# Patient Record
Sex: Male | Born: 1965
Health system: Southern US, Community
[De-identification: ages and names within clinical notes are randomized; demographics above are authoritative.]

## PROBLEM LIST (undated history)

## (undated) DIAGNOSIS — Z8619 Personal history of other infectious and parasitic diseases: Secondary | ICD-10-CM

## (undated) DIAGNOSIS — K635 Polyp of colon: Secondary | ICD-10-CM

## (undated) DIAGNOSIS — I10 Essential (primary) hypertension: Secondary | ICD-10-CM

## (undated) DIAGNOSIS — L409 Psoriasis, unspecified: Secondary | ICD-10-CM

## (undated) DIAGNOSIS — E785 Hyperlipidemia, unspecified: Secondary | ICD-10-CM

## (undated) DIAGNOSIS — B019 Varicella without complication: Secondary | ICD-10-CM

## (undated) HISTORY — DX: Personal history of other infectious and parasitic diseases: Z86.19

## (undated) HISTORY — DX: Polyp of colon: K63.5

## (undated) HISTORY — DX: Varicella without complication: B01.9

## (undated) HISTORY — DX: Psoriasis, unspecified: L40.9

## (undated) HISTORY — DX: Hyperlipidemia, unspecified: E78.5

## (undated) HISTORY — DX: Essential (primary) hypertension: I10

---

## 1994-04-09 HISTORY — PX: CHOLECYSTECTOMY: SHX55

## 2015-12-13 ENCOUNTER — Encounter: Payer: Self-pay | Admitting: Family Medicine

## 2015-12-13 ENCOUNTER — Ambulatory Visit (INDEPENDENT_AMBULATORY_CARE_PROVIDER_SITE_OTHER): Payer: BLUE CROSS/BLUE SHIELD | Admitting: Family Medicine

## 2015-12-13 VITALS — BP 118/86 | HR 75 | Temp 98.1°F | Resp 16 | Ht 69.0 in | Wt 243.0 lb

## 2015-12-13 DIAGNOSIS — G8929 Other chronic pain: Secondary | ICD-10-CM

## 2015-12-13 DIAGNOSIS — E785 Hyperlipidemia, unspecified: Secondary | ICD-10-CM | POA: Insufficient documentation

## 2015-12-13 DIAGNOSIS — L409 Psoriasis, unspecified: Secondary | ICD-10-CM

## 2015-12-13 DIAGNOSIS — Z23 Encounter for immunization: Secondary | ICD-10-CM | POA: Diagnosis not present

## 2015-12-13 DIAGNOSIS — M47812 Spondylosis without myelopathy or radiculopathy, cervical region: Secondary | ICD-10-CM

## 2015-12-13 DIAGNOSIS — K635 Polyp of colon: Secondary | ICD-10-CM | POA: Diagnosis not present

## 2015-12-13 DIAGNOSIS — E669 Obesity, unspecified: Secondary | ICD-10-CM

## 2015-12-13 DIAGNOSIS — Z114 Encounter for screening for human immunodeficiency virus [HIV]: Secondary | ICD-10-CM

## 2015-12-13 DIAGNOSIS — M722 Plantar fascial fibromatosis: Secondary | ICD-10-CM | POA: Insufficient documentation

## 2015-12-13 DIAGNOSIS — M542 Cervicalgia: Secondary | ICD-10-CM | POA: Diagnosis not present

## 2015-12-13 MED ORDER — MELOXICAM 15 MG PO TABS: 7.5000 mg | ORAL_TABLET | Freq: Every day | ORAL | 2 refills | Status: DC | PRN

## 2015-12-13 MED ORDER — FENOFIBRATE MICRONIZED 67 MG PO CAPS: 67.0000 mg | ORAL_CAPSULE | Freq: Every day | ORAL | 3 refills | Status: DC

## 2015-12-13 MED ORDER — PRAVASTATIN SODIUM 40 MG PO TABS: 40.0000 mg | ORAL_TABLET | Freq: Every day | ORAL | 3 refills | Status: DC

## 2015-12-13 NOTE — Patient Instructions (Signed)
Thank you for coming in to clinic today.  1. Your blood pressure was normal on re-check 118/86, initially it was nearly normal 120/90 2. Ordered future blood work see below 3. Keep up good work with walking and exercise regimen, try to eat a healthy diet, low carb / low sugar  Follow-up as you are planning to with Pain Management, please make sure they send us the records as well  Refills meds sent today  For Mobic, as discussed, I am concerned about long-term use affecting your kidneys and liver, recommend reducing dose to half tab 7.5mg  daily for up to 1 month see if this works. Otherwise would ideally take up to 2 weeks off and then 1 month back on the medicine.  If your plantar fasciitis is not improving, let me know and we can refer you to a Sports Medicine Specialist  For Colonoscopy, due in 2018, we will refer you to GI specialist to proceed with the screening  - Please schedule a "Lab Only" visit (early morning, 8:00am to 9:00am) to get your blood work drawn here at our clinic. - You need to be fasting (No food or drink after midnight, and nothing in the morning before your blood draw). - I have already ordered your blood work, so you may schedule this appointment at your convenience - We can discuss results at next appointment, otherwise our office will contact you with results by phone or with a letter   Please schedule a follow-up appointment with Dr. Althea CharonKaramalegos in 1 year for Annual Physical / Labs - please send us a mychart note or call clinic to notify us to schedule / orders labs before your visit.  If you have any other questions or concerns, please feel free to call the clinic or send a message through MyChart. You may also schedule an earlier appointment if necessary.  Saralyn PilarAlexander Karamalegos, DO [facility]

## 2015-12-13 NOTE — Progress Notes (Signed)
Subjective:    Patient ID: Isaiah Johnson, male    DOB: 10/10/1965, 50 y.o.   MRN: [id number]  Isaiah Johnson is a 50 y.o. male presenting on 12/13/2015 for Establish Care   HPI  HYPERLIPIDEMIA: - Reports no concerns. Last lipid panel >1 yr ago, reportedly controlled. Not fasting today. - Currently taking Pravastatin 40mg  daily, Fenofibrate 67mg , tolerating well, needs refills - Also taking OTC Niacin 500mg  daily and Fish Oil - Regular exercise with fast walking 2-3x weekly for long walk, nightly 20 min walk with wife - H/o obesity, fam history heart disease and DM - Denies any myalgias, muscle aches  CHRONIC NECK PAIN / Left Foot Plantar Fasciitis - Reports no known inciting injury, except possible whiplash injury back in 90s. History of pinched nerve with numbness / tingling radiating down Right arm, previously treated by pain management doctors in [place], had epidural injections with improvement, last in 2016, usually lasts up to 9 months. Also past treated with oxycodone, but did not tolerate well due to constipation, has been off opiates for years - Currently doing well without active neck pain or flare of symptoms. - Taking Mobic 15mg  daily, for up to 8 years now, for plantar fasciitis left foot, seems to continue to help, previously had injections. - Denies any trauma, fall or injury, numbness, tingling, paresthesias, extremity swelling, redness  Psoriasis, without arthritis - Chronic problem 10 yr, reportedly limited to skin with overall significant improvement on treatment. He denies any involvement of joints or arthritis. - Followed by [facility], currently treated with Fluocinonide 0.05% BID most days, Triamcinolone 0.1% PRN sensitive areas  Additional Social History: - Moved from [place] 1 year ago, has not re-established PCP in [place].  - Works as Production designer, theatre/television/film in [employer], Psychiatric nurse Maintenance: - Last colonoscopy 2013 with several polyps, advised to  return 5 years - 2018, also fam history uncle passed from colon cancer in 67s - No known family history of prostate CA. Denies any current prostate symptoms. - Due for Tdap, will get today - Due for Flu Shot, will return once clinic ready to give flu shot    Past Medical History:  Diagnosis Date  . Chicken pox   . Colon polyps   . H/O rubella   . History of measles   . Hyperlipidemia   . Psoriasis    Social History   Social History  . Marital status: Married    Spouse name: Chiropodist  . Number of children: N/A  . Years of education: High school   Occupational History  . Environmental consultant    Social History Main Topics  . Smoking status: Never Smoker  . Smokeless tobacco: Never Used  . Alcohol use Yes     Comment: 1 drink  . Drug use: No  . Sexual activity: Not on file   Other Topics Concern  . Not on file   Social History Narrative  . No narrative on file   Family History  Problem Relation Age of Onset  . Diabetes Mother   . Heart disease Father   . Depression Father    No current outpatient prescriptions on file prior to visit.   No current facility-administered medications on file prior to visit.     Review of Systems Per HPI unless specifically indicated above     Objective:    BP 118/86 (BP Location: Left Arm, Cuff Size: Normal)   Pulse 75   Temp 98.1 F (36.7 C) (  Oral)   Resp 16   Ht 5\' 9"  (1.753 m)   Wt 243 lb (110.2 kg)   BMI 35.88 kg/m   Wt Readings from Last 3 Encounters:  12/13/15 243 lb (110.2 kg)    Physical Exam  Constitutional: He is oriented to person, place, and time. He appears well-developed and well-nourished. No distress.  Well-appearing, comfortable, cooperative, obese  HENT:  Head: Normocephalic and atraumatic.  Mouth/Throat: Oropharynx is clear and moist.  Eyes: Conjunctivae and EOM are normal. Pupils are equal, round, and reactive to light.  Neck: Neck supple. No thyromegaly present.  Neck: Inspection:  Normal appearance without deformity or asymmetry Palpation: Supple, non-tender, no hypertonicity ROM: Full active flexion / extension, very mild limited Left rotation, normal Right rotation Special Testing: Spurling's maneuver negative for radiculopathy bilateral, mild discomfort on Left Neurovascular: distal sensation intact upper ext  Cardiovascular: Normal rate, regular rhythm, normal heart sounds and intact distal pulses.   No murmur heard. Pulmonary/Chest: Effort normal and breath sounds normal. No respiratory distress. He has no wheezes. He has no rales.  Abdominal: Soft. Bowel sounds are normal. He exhibits no distension and no mass. There is no tenderness.  Musculoskeletal: Normal range of motion. He exhibits no edema or tenderness.  Lymphadenopathy:    He has no cervical adenopathy.  Neurological: He is alert and oriented to person, place, and time.  Skin: Skin is warm and dry. No rash noted. He is not diaphoretic.  Psychiatric: He has a normal mood and affect. His behavior is normal.  Nursing note and vitals reviewed.  No results found for this or any previous visit.    Assessment & Plan:   Problem List Items Addressed This Visit    Psoriasis    Stable well controlled, followed by Dermatology Continues on Fluocinonide 0.05% cream, Triamcinolone 0.1% cream      Plantar fasciitis of left foot    Chronic problem with Left foot >8 yrs, improved on Meloxicam, has not taken break from this med, trial on injections and other therapy.  Plan: 1. Concern about chronic mobic use - refilled today and advised to reduce dose to 7.5 half tab daily for 1 month, future can stop for up to 2-4 weeks then resume for 1 month at a time if needed 2. Advised on some ice bottle stretches 3. Future follow-up may need referral to Sports Med      Relevant Medications   meloxicam (MOBIC) 15 MG tablet   Obesity    Chronic problem with BMI >35, HLD, and risk factors with family history of heart  disease and diabetes.  Plan: 1. Encouraged to continue improved lifestyle modifications with regular aerobic exercise, dietary modifications 2. Check CMET - if elevated glucose will proceed with A1c testing for DM screening, alternatively next visit will offer A1c 3. Check fasting Lipid panel      Relevant Orders   COMPLETE METABOLIC PANEL WITH GFR   Lipid panel   Hyperlipidemia - Primary    Chronic problem with dyslipidemia, without available prior lipid panel results, by report controlled on current regimen.  Plan: 1. Check fasting lipid panel - follow-up 2. Refilled current Pravastatin 40mg  daily (mod intensity), Fenofibrate 67mg  daily, Niacin 500mg  nightly, with ASA 81      Relevant Medications   aspirin EC 81 MG tablet   fenofibrate micronized (LOFIBRA) 67 MG capsule   pravastatin (PRAVACHOL) 40 MG tablet   Other Relevant Orders   COMPLETE METABOLIC PANEL WITH GFR   Lipid panel  DJD (degenerative joint disease) of cervical spine   Relevant Medications   aspirin EC 81 MG tablet   meloxicam (MOBIC) 15 MG tablet   Colon polyps   Chronic neck pain    Currently improved and stable, likely secondary to C-spine DJD with prior nerve injury. Previously followed by pain management / anesthesia treated with cervical epidural injections with improvement up to 9 months.  Plan: 1. Patient already currently establishing with local pain management [place] Dr Pernell DupreAdams, will follow, request records, if needs referral will place, likely need to continue epidural and other treatments. Discussion today no plans to rx long-term opiates, patient not interested regardless. 2. Refill Mobic      Relevant Medications   aspirin EC 81 MG tablet   meloxicam (MOBIC) 15 MG tablet    Other Visit Diagnoses    Screening for HIV (human immunodeficiency virus)       Relevant Orders   HIV antibody   Need for Tdap vaccination       Relevant Orders   Tdap vaccine greater than or equal to 7yo IM  (Completed)      Meds ordered this encounter  Medications  .       .       .       .       .       .       .       .       .       .       . fenofibrate micronized (LOFIBRA) 67 MG capsule    Sig: Take 1 capsule (67 mg total) by mouth daily before breakfast.    Dispense:  90 capsule    Refill:  3  . meloxicam (MOBIC) 15 MG tablet    Sig: Take 0.5-1 tablets (7.5-15 mg total) by mouth daily as needed for pain.    Dispense:  30 tablet    Refill:  2  . pravastatin (PRAVACHOL) 40 MG tablet    Sig: Take 1 tablet (40 mg total) by mouth daily.    Dispense:  90 tablet    Refill:  3      Follow up plan: Return in about 1 year (around 12/12/2016) for cholesterol, physical, chronic pain.  Saralyn PilarAlexander Kanisha Duba, DO [facility] 12/13/2015, 4:15 PM

## 2015-12-13 NOTE — Assessment & Plan Note (Signed)
Currently improved and stable, likely secondary to C-spine DJD with prior nerve injury. Previously followed by pain management / anesthesia treated with cervical epidural injections with improvement up to 9 months.  Plan: 1. Patient already currently establishing with local pain management [place] Dr Pernell DupreAdams, will follow, request records, if needs referral will place, likely need to continue epidural and other treatments. Discussion today no plans to rx long-term opiates, patient not interested regardless. 2. Refill Mobic

## 2015-12-13 NOTE — Assessment & Plan Note (Addendum)
Chronic problem with BMI >35, HLD, and risk factors with family history of heart disease and diabetes.  Plan: 1. Encouraged to continue improved lifestyle modifications with regular aerobic exercise, dietary modifications 2. Check CMET - if elevated glucose will proceed with A1c testing for DM screening, alternatively next visit will offer A1c 3. Check fasting Lipid panel

## 2015-12-13 NOTE — Assessment & Plan Note (Signed)
Chronic problem with dyslipidemia, without available prior lipid panel results, by report controlled on current regimen.  Plan: 1. Check fasting lipid panel - follow-up 2. Refilled current Pravastatin 40mg  daily (mod intensity), Fenofibrate 67mg  daily, Niacin 500mg  nightly, with ASA 81

## 2015-12-13 NOTE — Assessment & Plan Note (Signed)
Stable well controlled, followed by Dermatology Continues on Fluocinonide 0.05% cream, Triamcinolone 0.1% cream

## 2015-12-13 NOTE — Assessment & Plan Note (Signed)
Chronic problem with Left foot >8 yrs, improved on Meloxicam, has not taken break from this med, trial on injections and other therapy.  Plan: 1. Concern about chronic mobic use - refilled today and advised to reduce dose to 7.5 half tab daily for 1 month, future can stop for up to 2-4 weeks then resume for 1 month at a time if needed 2. Advised on some ice bottle stretches 3. Future follow-up may need referral to Sports Med

## 2016-01-05 ENCOUNTER — Other Ambulatory Visit: Payer: Self-pay

## 2016-01-05 DIAGNOSIS — Z114 Encounter for screening for human immunodeficiency virus [HIV]: Secondary | ICD-10-CM

## 2016-01-05 DIAGNOSIS — E669 Obesity, unspecified: Secondary | ICD-10-CM

## 2016-01-05 DIAGNOSIS — E785 Hyperlipidemia, unspecified: Secondary | ICD-10-CM | POA: Diagnosis not present

## 2016-01-06 ENCOUNTER — Other Ambulatory Visit (INDEPENDENT_AMBULATORY_CARE_PROVIDER_SITE_OTHER): Payer: BLUE CROSS/BLUE SHIELD

## 2016-01-06 DIAGNOSIS — Z23 Encounter for immunization: Secondary | ICD-10-CM

## 2016-01-06 LAB — LIPID PANEL
CHOLESTEROL: 139 mg/dL (ref 125–200)
HDL: 33 mg/dL — ABNORMAL LOW (ref >=40)
LDL Cholesterol: 88 mg/dL (ref <130)
TRIGLYCERIDES: 91 mg/dL (ref <150)
Total CHOL/HDL Ratio: 4.2 Ratio (ref <=5.0)
VLDL: 18 mg/dL (ref <30)

## 2016-01-06 LAB — COMPLETE METABOLIC PANEL WITH GFR
ALBUMIN: 4.3 g/dL (ref 3.6–5.1)
ALT: 24 U/L (ref 9–46)
AST: 20 U/L (ref 10–40)
Alkaline Phosphatase: 58 U/L (ref 40–115)
BILIRUBIN TOTAL: 0.4 mg/dL (ref 0.2–1.2)
BUN: 19 mg/dL (ref 7–25)
CALCIUM: 9.1 mg/dL (ref 8.6–10.3)
CO2: 22 mmol/L (ref 20–31)
CREATININE: 0.98 mg/dL (ref 0.60–1.35)
Chloride: 105 mmol/L (ref 98–110)
GFR, Est African American: >89 mL/min (ref >=60)
Glucose, Bld: 90 mg/dL (ref 65–99)
Potassium: 4.6 mmol/L (ref 3.5–5.3)
Sodium: 138 mmol/L (ref 135–146)
Total Protein: 6.5 g/dL (ref 6.1–8.1)

## 2016-01-07 LAB — HIV ANTIBODY (ROUTINE TESTING W REFLEX): HIV 1&2 Ab, 4th Generation: NONREACTIVE

## 2016-01-11 DIAGNOSIS — D225 Melanocytic nevi of trunk: Secondary | ICD-10-CM | POA: Diagnosis not present

## 2016-01-25 ENCOUNTER — Ambulatory Visit: Payer: BLUE CROSS/BLUE SHIELD | Attending: Anesthesiology | Admitting: Anesthesiology

## 2016-01-25 ENCOUNTER — Other Ambulatory Visit: Payer: Self-pay | Admitting: Anesthesiology

## 2016-01-25 ENCOUNTER — Encounter: Payer: Self-pay | Admitting: Anesthesiology

## 2016-01-25 VITALS — BP 127/83 | HR 73 | Temp 98.3°F | Resp 16 | Ht 69.0 in | Wt 240.0 lb

## 2016-01-25 DIAGNOSIS — Z9049 Acquired absence of other specified parts of digestive tract: Secondary | ICD-10-CM | POA: Insufficient documentation

## 2016-01-25 DIAGNOSIS — M79601 Pain in right arm: Secondary | ICD-10-CM | POA: Insufficient documentation

## 2016-01-25 DIAGNOSIS — Z8249 Family history of ischemic heart disease and other diseases of the circulatory system: Secondary | ICD-10-CM | POA: Insufficient documentation

## 2016-01-25 DIAGNOSIS — M5412 Radiculopathy, cervical region: Secondary | ICD-10-CM

## 2016-01-25 DIAGNOSIS — M25511 Pain in right shoulder: Secondary | ICD-10-CM | POA: Diagnosis not present

## 2016-01-25 DIAGNOSIS — M5030 Other cervical disc degeneration, unspecified cervical region: Secondary | ICD-10-CM

## 2016-01-25 DIAGNOSIS — Z86010 Personal history of colonic polyps: Secondary | ICD-10-CM | POA: Diagnosis not present

## 2016-01-25 DIAGNOSIS — G8929 Other chronic pain: Secondary | ICD-10-CM | POA: Diagnosis not present

## 2016-01-25 DIAGNOSIS — M1288 Other specific arthropathies, not elsewhere classified, other specified site: Secondary | ICD-10-CM | POA: Diagnosis not present

## 2016-01-25 DIAGNOSIS — Z8619 Personal history of other infectious and parasitic diseases: Secondary | ICD-10-CM | POA: Diagnosis not present

## 2016-01-25 DIAGNOSIS — M50122 Cervical disc disorder at C5-C6 level with radiculopathy: Secondary | ICD-10-CM | POA: Insufficient documentation

## 2016-01-25 DIAGNOSIS — Z833 Family history of diabetes mellitus: Secondary | ICD-10-CM | POA: Insufficient documentation

## 2016-01-25 DIAGNOSIS — G9520 Unspecified cord compression: Secondary | ICD-10-CM | POA: Diagnosis not present

## 2016-01-25 DIAGNOSIS — E785 Hyperlipidemia, unspecified: Secondary | ICD-10-CM | POA: Insufficient documentation

## 2016-01-25 DIAGNOSIS — Z7982 Long term (current) use of aspirin: Secondary | ICD-10-CM | POA: Diagnosis not present

## 2016-01-25 DIAGNOSIS — M542 Cervicalgia: Secondary | ICD-10-CM

## 2016-01-25 DIAGNOSIS — M50123 Cervical disc disorder at C6-C7 level with radiculopathy: Secondary | ICD-10-CM | POA: Insufficient documentation

## 2016-01-25 DIAGNOSIS — L409 Psoriasis, unspecified: Secondary | ICD-10-CM | POA: Diagnosis not present

## 2016-01-25 DIAGNOSIS — M47812 Spondylosis without myelopathy or radiculopathy, cervical region: Secondary | ICD-10-CM

## 2016-01-25 MED ORDER — DEXAMETHASONE SODIUM PHOSPHATE 10 MG/ML IJ SOLN: 10.0000 mg | Freq: Once | INTRAMUSCULAR | Status: DC

## 2016-01-25 MED ORDER — ROPIVACAINE HCL 2 MG/ML IJ SOLN
INTRAMUSCULAR | Status: AC
  Filled 2016-01-25: qty 10

## 2016-01-25 MED ORDER — ROPIVACAINE HCL 2 MG/ML IJ SOLN: 10.0000 mL | Freq: Once | INTRAMUSCULAR | Status: DC

## 2016-01-25 MED ORDER — DEXAMETHASONE SODIUM PHOSPHATE 10 MG/ML IJ SOLN
INTRAMUSCULAR | Status: AC
  Filled 2016-01-25: qty 1

## 2016-01-25 NOTE — Patient Instructions (Signed)
Trigger Point Injection Trigger points are areas where you have muscle pain. A trigger point injection is a shot given in the trigger point to relieve that pain. A trigger point might feel like a knot in your muscle. It hurts to press on a trigger point. Sometimes the pain spreads out (radiates) to other parts of the body. For example, pressing on a trigger point in your shoulder might cause pain in your arm or neck. You might have one trigger point. Or, you might have more than one. People often have trigger points in their upper back and lower back. They also occur often in the neck and shoulders. Pain from a trigger point lasts for a long time. It can make it hard to keep moving. You might not be able to do the exercise or physical therapy that could help you deal with the pain. A trigger point injection may help. It does not work for everyone. But, it may relieve your pain for a few days or a few months. A trigger point injection does not cure long-lasting (chronic) pain. LET YOUR CAREGIVER KNOW ABOUT:  Any allergies (especially to latex, lidocaine, or steroids).  Blood-thinning medicines that you take. These drugs can lead to bleeding or bruising after an injection. They include:  Aspirin.  Ibuprofen.  Clopidogrel.  Warfarin.  Other medicines you take. This includes all vitamins, herbs, eyedrops, over-the-counter medicines, and creams.  Use of steroids.  Recent infections.  Past problems with numbing medicines.  Bleeding problems.  Surgeries you have had.  Other health problems. RISKS AND COMPLICATIONS A trigger point injection is a safe treatment. However, problems may develop, such as:  Minor side effects usually go away in 1 to 2 days. These may include:  Soreness.  Bruising.  Stiffness.  More serious problems are rare. But, they may include:  Bleeding under the skin (hematoma).  Skin infection.  Breaking off of the needle under your skin.  Lung  puncture.  The trigger point injection may not work for you. BEFORE THE PROCEDURE You may need to stop taking any medicine that thins your blood. This is to prevent bleeding and bruising. Usually these medicines are stopped several days before the injection. No other preparation is needed. PROCEDURE  A trigger point injection can be given in your caregiver's office or in a clinic. Each injection takes 2 minutes or less.  Your caregiver will feel for trigger points. The caregiver may use a marker to circle the area for the injection.  The skin over the trigger point will be washed with a germ-killing (antiseptic) solution.  The caregiver pinches the spot for the injection.  Then, a very thin needle is used for the shot. You may feel pain or a twitching feeling when the needle enters the trigger point.  A numbing solution may be injected into the trigger point. Sometimes a drug to keep down swelling, redness, and warmth (inflammation) is also injected.  Your caregiver moves the needle around the trigger zone until the tightness and twitching goes away.  After the injection, your caregiver may put gentle pressure over the injection site.  Then it is covered with a bandage. AFTER THE PROCEDURE  You can go right home after the injection.  The bandage can be taken off after a few hours.  You may feel sore and stiff for 1 to 2 days.  Go back to your regular activities slowly. Your caregiver may ask you to stretch your muscles. Do not do anything that takes   extra energy for a few days.  Follow your caregiver's instructions to manage and treat other pain.   This information is not intended to replace advice given to you by your health care provider. Make sure you discuss any questions you have with your health care provider.   Document Released: 03/15/2011 Document Revised: 07/21/2012 Document Reviewed: 03/15/2011 [employer] Interactive Patient Education 2016 [employer] Epidural  Steroid Injection Patient Information  Description: The epidural space surrounds the nerves as they exit the spinal cord.  In some patients, the nerves can be compressed and inflamed by a bulging disc or a tight spinal canal (spinal stenosis).  By injecting steroids into the epidural space, we can bring irritated nerves into direct contact with a potentially helpful medication.  These steroids act directly on the irritated nerves and can reduce swelling and inflammation which often leads to decreased pain.  Epidural steroids may be injected anywhere along the spine and from the neck to the low back depending upon the location of your pain.   After numbing the skin with local anesthetic (like Novocaine), a small needle is passed into the epidural space slowly.  You may experience a sensation of pressure while this is being done.  The entire block usually last less than 10 minutes.  Conditions which may be treated by epidural steroids:   Low back and leg pain  Neck and arm pain  Spinal stenosis  Post-laminectomy syndrome  Herpes zoster (shingles) pain  Pain from compression fractures  Preparation for the injection:  1. Do not eat any solid food or dairy products within 8 hours of your appointment.  2. You may drink clear liquids up to 3 hours before appointment.  Clear liquids include water, black coffee, juice or soda.  No milk or cream please. 3. You may take your regular medication, including pain medications, with a sip of water before your appointment  Diabetics should hold regular insulin (if taken separately) and take 1/2 normal NPH dos the morning of the procedure.  Carry some sugar containing items with you to your appointment. 4. A driver must accompany you and be prepared to drive you home after your procedure.  5. Bring all your current medications with your. 6. An IV may be inserted and sedation may be given at the discretion of the physician.   7. A blood pressure cuff, EKG  and other monitors will often be applied during the procedure.  Some patients may need to have extra oxygen administered for a short period. 8. You will be asked to provide medical information, including your allergies, prior to the procedure.  We must know immediately if you are taking blood thinners (like Coumadin/Warfarin)  Or if you are allergic to IV iodine contrast (dye). We must know if you could possible be pregnant.  Possible side-effects:  Bleeding from needle site  Infection (rare, may require surgery)  Nerve injury (rare)  Numbness & tingling (temporary)  Difficulty urinating (rare, temporary)  Spinal headache ( a headache worse with upright posture)  Light -headedness (temporary)  Pain at injection site (several days)  Decreased blood pressure (temporary)  Weakness in arm/leg (temporary)  Pressure sensation in back/neck (temporary)  Call if you experience:  Fever/chills associated with headache or increased back/neck pain.  Headache worsened by an upright position.  New onset weakness or numbness of an extremity below the injection site  Hives or difficulty breathing (go to the emergency room)  Inflammation or drainage at the infection site  Severe back/neck  pain  Any new symptoms which are concerning to you  Please note:  Although the local anesthetic injected can often make your back or neck feel good for several hours after the injection, the pain will likely return.  It takes 3-7 days for steroids to work in the epidural space.  You may not notice any pain relief for at least that one week.  If effective, we will often do a series of three injections spaced 3-6 weeks apart to maximally decrease your pain.  After the initial series, we generally will wait several months before considering a repeat injection of the same type.  If you have any questions, please call [phone] [facility] R[facility] AND  COMPLICATIONS  What are the risk, side effects and possible complications? Generally speaking, most procedures are safe.  However, with any procedure there are risks, side effects, and the possibility of complications.  The risks and complications are dependent upon the sites that are lesioned, or the type of nerve block to be performed.  The closer the procedure is to the spine, the more serious the risks are.  Great care is taken when placing the radio frequency needles, block needles or lesioning probes, but sometimes complications can occur. 1. Infection: Any time there is an injection through the skin, there is a risk of infection.  This is why sterile conditions are used for these blocks.  There are four possible types of infection. 1. Localized skin infection. 2. Central Nervous System Infection-This can be in the form of Meningitis, which can be deadly. 3. Epidural Infections-This can be in the form of an epidural abscess, which can cause pressure inside of the spine, causing compression of the spinal cord with subsequent paralysis. This would require an emergency surgery to decompress, and there are no guarantees that the patient would recover from the paralysis. 4. Discitis-This is an infection of the intervertebral discs.  It occurs in about 1% of discography procedures.  It is difficult to treat and it may lead to surgery.        2. Pain: the needles have to go through skin and soft tissues, will cause soreness.       3. Damage to internal structures:  The nerves to be lesioned may be near blood vessels or    other nerves which can be potentially damaged.       4. Bleeding: Bleeding is more common if the patient is taking blood thinners such as  aspirin, Coumadin, Ticiid, Plavix, etc., or if he/she have some genetic predisposition  such as hemophilia. Bleeding into the spinal canal can cause compression of the spinal  cord with subsequent paralysis.  This would require an emergency surgery  to  decompress and there are no guarantees that the patient would recover from the  paralysis.       5. Pneumothorax:  Puncturing of a lung is a possibility, every time a needle is introduced in  the area of the chest or upper back.  Pneumothorax refers to free air around the  collapsed lung(s), inside of the thoracic cavity (chest cavity).  Another two possible  complications related to a similar event would include: Hemothorax and Chylothorax.   These are variations of the Pneumothorax, where instead of air around the collapsed  lung(s), you may have blood or chyle, respectively.       6. Spinal headaches: They may occur with any procedures in the area of the spine.  7. Persistent CSF (Cerebro-Spinal Fluid) leakage: This is a rare problem, but may occur  with prolonged intrathecal or epidural catheters either due to the formation of a fistulous  track or a dural tear.       8. Nerve damage: By working so close to the spinal cord, there is always a possibility of  nerve damage, which could be as serious as a permanent spinal cord injury with  paralysis.       9. Death:  Although rare, severe deadly allergic reactions known as "Anaphylactic  reaction" can occur to any of the medications used.      10. Worsening of the symptoms:  We can always make thing worse.  What are the chances of something like this happening? Chances of any of this occuring are extremely low.  By statistics, you have more of a chance of getting killed in a motor vehicle accident: while driving to the hospital than any of the above occurring .  Nevertheless, you should be aware that they are possibilities.  In general, it is similar to taking a shower.  Everybody knows that you can slip, hit your head and get killed.  Does that mean that you should not shower again?  Nevertheless always keep in mind that statistics do not mean anything if you happen to be on the wrong side of them.  Even if a procedure has a 1 (one) in a 1,000,000  (million) chance of going wrong, it you happen to be that one..Also, keep in mind that by statistics, you have more of a chance of having something go wrong when taking medications.  Who should not have this procedure? If you are on a blood thinning medication (e.g. Coumadin, Plavix, see list of "Blood Thinners"), or if you have an active infection going on, you should not have the procedure.  If you are taking any blood thinners, please inform your physician.  How should I prepare for this procedure?  Do not eat or drink anything at least six hours prior to the procedure.  Bring a driver with you .  It cannot be a taxi.  Come accompanied by an adult that can drive you back, and that is strong enough to help you if your legs get weak or numb from the local anesthetic.  Take all of your medicines the morning of the procedure with just enough water to swallow them.  If you have diabetes, make sure that you are scheduled to have your procedure done first thing in the morning, whenever possible.  If you have diabetes, take only half of your insulin dose and notify our nurse that you have done so as soon as you arrive at the clinic.  If you are diabetic, but only take blood sugar pills (oral hypoglycemic), then do not take them on the morning of your procedure.  You may take them after you have had the procedure.  Do not take aspirin or any aspirin-containing medications, at least eleven (11) days prior to the procedure.  They may prolong bleeding.  Wear loose fitting clothing that may be easy to take off and that you would not mind if it got stained with Betadine or blood.  Do not wear any jewelry or perfume  Remove any nail coloring.  It will interfere with some of our monitoring equipment.  NOTE: Remember that this is not meant to be interpreted as a complete list of all possible complications.  Unforeseen problems may occur.  BLOOD THINNERS The  following drugs contain aspirin or other  products, which can cause increased bleeding during surgery and should not be taken for 2 weeks prior to and 1 week after surgery.  If you should need take something for relief of minor pain, you may take acetaminophen which is found in Tylenol,m Datril, Anacin-3 and Panadol. It is not blood thinner. The products listed below are.  Do not take any of the products listed below in addition to any listed on your instruction sheet.  A.P.C or A.P.C with Codeine Codeine Phosphate Capsules #3 Ibuprofen Ridaura  ABC compound Congesprin Imuran rimadil  Advil Cope Indocin Robaxisal  Alka-Seltzer Effervescent Pain Reliever and Antacid Coricidin or Coricidin-D  Indomethacin Rufen  Alka-Seltzer plus Cold Medicine Cosprin Ketoprofen S-A-C Tablets  Anacin Analgesic Tablets or Capsules Coumadin Korlgesic Salflex  Anacin Extra Strength Analgesic tablets or capsules CP-2 Tablets Lanoril Salicylate  Anaprox Cuprimine Capsules Levenox Salocol  Anexsia-D Dalteparin Magan Salsalate  Anodynos Darvon compound Magnesium Salicylate Sine-off  Ansaid Dasin Capsules Magsal Sodium Salicylate  Anturane Depen Capsules Marnal Soma  APF Arthritis pain formula Dewitt's Pills Measurin Stanback  Argesic Dia-Gesic Meclofenamic Sulfinpyrazone  Arthritis Bayer Timed Release Aspirin Diclofenac Meclomen Sulindac  Arthritis pain formula Anacin Dicumarol Medipren Supac  Analgesic (Safety coated) Arthralgen Diffunasal Mefanamic Suprofen  Arthritis Strength Bufferin Dihydrocodeine Mepro Compound Suprol  Arthropan liquid Dopirydamole Methcarbomol with Aspirin Synalgos  ASA tablets/Enseals Disalcid Micrainin Tagament  Ascriptin Doan's Midol Talwin  Ascriptin A/D Dolene Mobidin Tanderil  Ascriptin Extra Strength Dolobid Moblgesic Ticlid  Ascriptin with Codeine Doloprin or Doloprin with Codeine Momentum Tolectin  Asperbuf Duoprin Mono-gesic Trendar  Aspergum Duradyne Motrin or Motrin IB Triminicin  Aspirin plain, buffered or enteric  coated Durasal Myochrisine Trigesic  Aspirin Suppositories Easprin Nalfon Trillsate  Aspirin with Codeine Ecotrin Regular or Extra Strength Naprosyn Uracel  Atromid-S Efficin Naproxen Ursinus  Auranofin Capsules Elmiron Neocylate Vanquish  Axotal Emagrin Norgesic Verin  Azathioprine Empirin or Empirin with Codeine Normiflo Vitamin E  Azolid Emprazil Nuprin Voltaren  Bayer Aspirin plain, buffered or children's or timed BC Tablets or powders Encaprin Orgaran Warfarin Sodium  Buff-a-Comp Enoxaparin Orudis Zorpin  Buff-a-Comp with Codeine Equegesic Os-Cal-Gesic   Buffaprin Excedrin plain, buffered or Extra Strength Oxalid   Bufferin Arthritis Strength Feldene Oxphenbutazone   Bufferin plain or Extra Strength Feldene Capsules Oxycodone with Aspirin   Bufferin with Codeine Fenoprofen Fenoprofen Pabalate or Pabalate-SF   Buffets II Flogesic Panagesic   Buffinol plain or Extra Strength Florinal or Florinal with Codeine Panwarfarin   Buf-Tabs Flurbiprofen Penicillamine   Butalbital Compound Four-way cold tablets Penicillin   Butazolidin Fragmin Pepto-Bismol   Carbenicillin Geminisyn Percodan   Carna Arthritis Reliever Geopen Persantine   Carprofen Gold's salt Persistin   Chloramphenicol Goody's Phenylbutazone   Chloromycetin Haltrain Piroxlcam   Clmetidine heparin Plaquenil   Cllnoril Hyco-pap Ponstel   Clofibrate Hydroxy chloroquine Propoxyphen         Before stopping any of these medications, be sure to consult the physician who ordered them.  Some, such as Coumadin (Warfarin) are ordered to prevent or treat serious conditions such as "deep thrombosis", "pumonary embolisms", and other heart problems.  The amount of time that you may need off of the medication may also vary with the medication and the reason for which you were taking it.  If you are taking any of these medications, please make sure you notify your pain physician before you undergo any procedures.

## 2016-01-25 NOTE — Progress Notes (Signed)
Subjective:  Patient ID: Isaiah Johnson, male    DOB: 11/21/1965  Age: 50 y.o. MRN: [id number]  CC: Neck Pain      PROCEDURE:Right trapezius trigger point injection 2  HPI Isaiah Johnson presents for a new patient evaluation. Isaiah Johnson has a long-standing history of neck pain and right shoulder and arm pain. This began 4 years ago. He has no known inciting event but has been seen by pain clinic previously where he has had cervical epidural steroid injections and cervical facet injections with significant relief in his pain. Presently he is describing a pain that starts in the neck region and radiates into the right posterior shoulder down the lateral aspect of the arm and affects the ring and little finger on his right hand with associated numbness and tingling. Sometimes he'll experience numbness and tingling affecting the entire hand as well. He is failed conservative therapy whereas exercises stretching and pain medication they'll to alleviate his pain. He describes this as a maximum VAS of 10 best a 3 worse with activity and not influenced by time of day. The pain is worse with lifting and improves with medication management nerve block techniques as mentioned cold and warm showers and massage. The pain wakes him up at night and is described as throbbing shooting horrible nagging pressure-like and pulsating. He's had previous MRI and neurologic evaluation as well as neurosurgical evaluation PNCV's and nerve blocks.  His MRI is unavailable to me but as per the dictation dated 8 2011 for his referring clinic it shows a cervical spine film 2010 with a left paracentral disc herniation at C6-C7 with some impingement and compression of the spinal cord and narrowing the left neural foramen at C4-C6 there is some reversal of normal cervical lordosis and at C5-6 central disc protrusion with broad-based disc and osteophyte complex with narrowing the left neural foramen bilaterally right greater than left at  C5-6. There is broad-based disc and osteophyte complex at C3-4 with mild narrowing the left neural foramen and at C4-5 there is a moderate prominent broad-based disc bulge with narrowing of the right neural foramen  History Isaiah Johnson has a past medical history of Chicken pox; Colon polyps; H/O rubella; History of measles; Hyperlipidemia; and Psoriasis.   He has a past surgical history that includes Cholecystectomy (1996).   His family history includes Depression in his father; Diabetes in his mother; Heart disease in his father and mother.He reports that he has never smoked. He has never used smokeless tobacco. He reports that he drinks alcohol. He reports that he does not use drugs.  No results found for this or any previous visit.  No results found for: TOXASSSELUR  Outpatient Medications Prior to Visit  Medication Sig Dispense Refill  . aspirin EC 81 MG tablet Take 81 mg by mouth daily.    . fenofibrate micronized (LOFIBRA) 67 MG capsule Take 1 capsule (67 mg total) by mouth daily before breakfast. 90 capsule 3  . meloxicam (MOBIC) 15 MG tablet Take 0.5-1 tablets (7.5-15 mg total) by mouth daily as needed for pain. 30 tablet 2  . Multiple Vitamin (MULTIVITAMIN WITH MINERALS) TABS tablet Take 1 tablet by mouth daily.    . niacin 500 MG tablet Take 500 mg by mouth at bedtime.    . Omega-3 Fatty Acids (FISH OIL) 1000 MG CAPS Take 1,000 mg by mouth daily.    . pravastatin (PRAVACHOL) 40 MG tablet Take 1 tablet (40 mg total) by mouth daily. 90 tablet 3  .  triamcinolone cream (KENALOG) 0.1 % Apply 1 application topically daily as needed.    . fluocinonide cream (LIDEX) 0.05 % Apply 1 application topically 2 (two) times daily as needed.    . vitamin E (VITAMIN E) 1000 UNIT capsule Take 1,000 Units by mouth daily.     No facility-administered medications prior to visit.    Lab Results  Component Value Date   GLUCOSE 90 01/05/2016   CHOL 139 01/05/2016   TRIG 91 01/05/2016   HDL 33 (L)  01/05/2016   LDLCALC 88 01/05/2016   ALT 24 01/05/2016   AST 20 01/05/2016   NA 138 01/05/2016   K 4.6 01/05/2016   CL 105 01/05/2016   CREATININE 0.98 01/05/2016   BUN 19 01/05/2016   CO2 22 01/05/2016    --------------------------------------------------------------------------------------------------------------------- Patient was never admitted.     ---------------------------------------------------------------------------------------------------------------------- Past Medical History:  Diagnosis Date  . Chicken pox   . Colon polyps   . H/O rubella   . History of measles   . Hyperlipidemia   . Psoriasis     Past Surgical History:  Procedure Laterality Date  . CHOLECYSTECTOMY  1996    Family History  Problem Relation Age of Onset  . Diabetes Mother   . Heart disease Mother   . Heart disease Father   . Depression Father     Social History  Substance Use Topics  . Smoking status: Never Smoker  . Smokeless tobacco: Never Used  . Alcohol use Yes     Comment: 1 drink    ---------------------------------------------------------------------------------------------------------------------- Social History   Social History  . Marital status: Married    Spouse name: Chiropodisttephanie Michelet  . Number of children: N/A  . Years of education: High school   Occupational History  . Environmental consultantCar Business Manager    Social History Main Topics  . Smoking status: Never Smoker  . Smokeless tobacco: Never Used  . Alcohol use Yes     Comment: 1 drink  . Drug use: No  . Sexual activity: Not Asked   Other Topics Concern  . None   Social History Narrative  . None    Scheduled Meds: . dexamethasone      . ropivacaine (PF) 2 mg/ml (0.2%)      . dexamethasone  10 mg Other Once  . ropivacaine (PF) 2 mg/ml (0.2%)  10 mL Epidural Once   Continuous Infusions:  PRN Meds:.   BP (!) 121/91   Pulse 75   Temp 98.3 F (36.8 C)   Resp 16   Ht 5\' 9"  (1.753 m)   Wt 240 lb  (108.9 kg)   SpO2 97%   BMI 35.44 kg/m    BP Readings from Last 3 Encounters:  01/25/16 (!) 121/91  12/13/15 118/86     Wt Readings from Last 3 Encounters:  01/25/16 240 lb (108.9 kg)  12/13/15 243 lb (110.2 kg)     ----------------------------------------------------------------------------------------------------------------------  ROS Review of Systems  Cardiac: Daily aspirin Pulmonary negative Neurologic: As above Psychologic: Negative GI: Negative  Objective:  BP (!) 121/91   Pulse 75   Temp 98.3 F (36.8 C)   Resp 16   Ht 5\' 9"  (1.753 m)   Wt 240 lb (108.9 kg)   SpO2 97%   BMI 35.44 kg/m   Physical Exam patient is a pleasant white male in no acute distress alert and oriented 3 cooperative compliant. Pupils are equally round reactive light extraocular muscles intact Heart is regular rate and rhythm without murmur  Lungs clear to also dictation Inspection the right trapezius reveals 2 trigger points in the proximal mid body posteriorly. He has mild crepitus with active range of motion at the occipital joint with anterior-posterior flexion extension He has limited lateral range of motion and his strength is 5 over 5 both proximal and distal in the upper extremities. Sensation appears to be grossly intact as is hand grasp.    Assessment & Plan:   Isaiah Johnson was seen today for neck pain.  Diagnoses and all orders for this visit:  DDD (degenerative disc disease), cervical -     Cervical Epidural Injection; Future -     ToxASSURE Select 13 (MW), Urine  Cervical radiculitis -     Cervical Epidural Injection; Future -     ToxASSURE Select 13 (MW), Urine  Cervicalgia -     ropivacaine (PF) 2 mg/ml (0.2%) (NAROPIN) epidural 10 mL; 10 mLs by Epidural route once. -     dexamethasone (DECADRON) injection 10 mg; 1 mL (10 mg total) by Other route once. -     TRIGGER POINT INJECTION -     ToxASSURE Select 13 (MW), Urine  Cervical facet syndrome -     ToxASSURE  Select 13 (MW), Urine  Other orders -     ropivacaine (PF) 2 mg/ml (0.2%) (NAROPIN) 2 MG/ML epidural;  -     dexamethasone (DECADRON) 10 MG/ML injection;      ----------------------------------------------------------------------------------------------------------------------  Problem List Items Addressed This Visit    None    Visit Diagnoses    DDD (degenerative disc disease), cervical    -  Primary   Relevant Medications   dexamethasone (DECADRON) injection 10 mg   dexamethasone (DECADRON) 10 MG/ML injection   Other Relevant Orders   Cervical Epidural Injection   ToxASSURE Select 13 (MW), Urine   Cervical radiculitis       Relevant Orders   Cervical Epidural Injection   ToxASSURE Select 13 (MW), Urine   Cervicalgia       Relevant Medications   ropivacaine (PF) 2 mg/ml (0.2%) (NAROPIN) epidural 10 mL   dexamethasone (DECADRON) injection 10 mg   Other Relevant Orders   TRIGGER POINT INJECTION   ToxASSURE Select 13 (MW), Urine   Cervical facet syndrome       Relevant Orders   ToxASSURE Select 13 (MW), Urine      ----------------------------------------------------------------------------------------------------------------------  1. DDD (degenerative disc disease), cervical We will schedule him for cervical epidural steroid injection at his next visit. We have requested that he discontinue his baby aspirin 7 days in advance. We have gone over the risks benefits and full detail all questions answered and I believe he is knowledgeable about the procedure. - Cervical Epidural Injection; Future - ToxASSURE Select 13 (MW), Urine  2. Cervical radiculitis As above - Cervical Epidural Injection; Future - ToxASSURE Select 13 (MW), Urine  3. Cervicalgia We'll proceed with trigger point injections today. His ability to the right mid body trapezius as described with wrist benefits also reviewed. - ropivacaine (PF) 2 mg/ml (0.2%) (NAROPIN) epidural 10 mL; 10 mLs by Epidural  route once. - dexamethasone (DECADRON) injection 10 mg; 1 mL (10 mg total) by Other route once. - TRIGGER POINT INJECTION - ToxASSURE Select 13 (MW), Urine  4. Cervical facet syndrome  - ToxASSURE Select 13 (MW), Urine    ----------------------------------------------------------------------------------------------------------------------  I am having Isaiah Johnson maintain his multivitamin with minerals, vitamin E, aspirin EC, niacin, Fish Oil, fluocinonide cream, triamcinolone cream, fenofibrate micronized, meloxicam, pravastatin, and  b complex vitamins. We will continue to administer ropivacaine (PF) 2 mg/ml (0.2%) and dexamethasone.   Meds ordered this encounter  Medications  . b complex vitamins capsule    Sig: Take 1 capsule by mouth daily.  . ropivacaine (PF) 2 mg/ml (0.2%) (NAROPIN) epidural 10 mL  . dexamethasone (DECADRON) injection 10 mg  . ropivacaine (PF) 2 mg/ml (0.2%) (NAROPIN) 2 MG/ML epidural    Jarold Motto, Delores: cabinet override  . dexamethasone (DECADRON) 10 MG/ML injection    Jarold Motto, Delores: cabinet override    Right mid body trapezius trigger point injection 2  Trigger point injection: The area overlying the aforementioned trigger points were prepped with alcohol. They were then injected with a 25-gauge needle with 4 cc of ropivacaine 0.2% and Decadron 4 mg at each site after negative aspiration for heme. This was performed after informed consent was obtained and risks and benefits reviewed. She tolerated this procedure without difficulty and was convalesced and discharged to home in stable condition for follow-up as mentioned.  @Olukemi Panchal, MD@   Follow-up: Return in about 3 weeks (around 02/15/2016) for procedure, evaluation.    Yevette Edwards, MD  This dictation was performed utilizing Dragon voice recognition software.  Please excuse any unintentional or mistaken typographical errors as a result of its unedited utilization.

## 2016-01-25 NOTE — Progress Notes (Signed)
Safety precautions to be maintained throughout the outpatient stay will include: orient to surroundings, keep bed in low position, maintain call bell within reach at all times, provide assistance with transfer out of bed and ambulation.  

## 2016-01-31 LAB — TOXASSURE SELECT 13 (MW), URINE

## 2016-02-01 ENCOUNTER — Ambulatory Visit: Payer: PRIVATE HEALTH INSURANCE | Admitting: Anesthesiology

## 2016-02-27 ENCOUNTER — Ambulatory Visit: Payer: BLUE CROSS/BLUE SHIELD | Attending: Anesthesiology | Admitting: Anesthesiology

## 2016-02-27 ENCOUNTER — Encounter: Payer: Self-pay | Admitting: Anesthesiology

## 2016-02-27 VITALS — BP 126/83 | HR 68 | Temp 98.2°F | Resp 16 | Ht 68.5 in | Wt 240.0 lb

## 2016-02-27 DIAGNOSIS — M25511 Pain in right shoulder: Secondary | ICD-10-CM | POA: Insufficient documentation

## 2016-02-27 DIAGNOSIS — Z8249 Family history of ischemic heart disease and other diseases of the circulatory system: Secondary | ICD-10-CM | POA: Insufficient documentation

## 2016-02-27 DIAGNOSIS — E785 Hyperlipidemia, unspecified: Secondary | ICD-10-CM | POA: Diagnosis not present

## 2016-02-27 DIAGNOSIS — Z823 Family history of stroke: Secondary | ICD-10-CM | POA: Insufficient documentation

## 2016-02-27 DIAGNOSIS — Z833 Family history of diabetes mellitus: Secondary | ICD-10-CM | POA: Diagnosis not present

## 2016-02-27 DIAGNOSIS — M50123 Cervical disc disorder at C6-C7 level with radiculopathy: Secondary | ICD-10-CM | POA: Insufficient documentation

## 2016-02-27 DIAGNOSIS — Z8619 Personal history of other infectious and parasitic diseases: Secondary | ICD-10-CM | POA: Diagnosis not present

## 2016-02-27 DIAGNOSIS — L409 Psoriasis, unspecified: Secondary | ICD-10-CM | POA: Diagnosis not present

## 2016-02-27 DIAGNOSIS — M50122 Cervical disc disorder at C5-C6 level with radiculopathy: Secondary | ICD-10-CM | POA: Diagnosis not present

## 2016-02-27 DIAGNOSIS — G9520 Unspecified cord compression: Secondary | ICD-10-CM | POA: Diagnosis not present

## 2016-02-27 DIAGNOSIS — Z86010 Personal history of colonic polyps: Secondary | ICD-10-CM | POA: Insufficient documentation

## 2016-02-27 DIAGNOSIS — Z9049 Acquired absence of other specified parts of digestive tract: Secondary | ICD-10-CM | POA: Diagnosis not present

## 2016-02-27 DIAGNOSIS — G8929 Other chronic pain: Secondary | ICD-10-CM | POA: Insufficient documentation

## 2016-02-27 DIAGNOSIS — M542 Cervicalgia: Secondary | ICD-10-CM | POA: Diagnosis not present

## 2016-02-27 DIAGNOSIS — M5412 Radiculopathy, cervical region: Secondary | ICD-10-CM

## 2016-02-27 DIAGNOSIS — Z7982 Long term (current) use of aspirin: Secondary | ICD-10-CM | POA: Insufficient documentation

## 2016-02-27 NOTE — Progress Notes (Signed)
Safety precautions to be maintained throughout the outpatient stay will include: orient to surroundings, keep bed in low position, maintain call bell within reach at all times, provide assistance with transfer out of bed and ambulation.  

## 2016-02-28 NOTE — Progress Notes (Signed)
Subjective:  Patient ID: Isaiah Johnson, male    DOB: [birth year]  Age: 50 y.o. MRN: [id number]  CC: Neck Pain (left) and Shoulder Pain (right)      PROCEDURE:Right trapezius trigger point injection 2  HPI  Dinner presents for reevaluation today. He was last seen approximately 1 month ago and had a trigger point injection at that time. He still having some pain in the right mid body trapezius similar to what was experienced in the past. He is doing exercises and physical therapy to help maintain range of motion and these seem to help. The quality of the trapezius pain has improved somewhat and he is intermittently having dysesthesia in the right hand as before. Otherwise he is in his usual state of health today.  [name] presented on his last visit for a new patient evaluation. Isaiah Johnson has a long-standing history of neck pain and right shoulder and arm pain. This began 4 years ago. He has no known inciting event but has been seen by pain clinic previously where he has had cervical epidural steroid injections and cervical facet injections with significant relief in his pain. Presently he is describing a pain that starts in the neck region and radiates into the right posterior shoulder down the lateral aspect of the arm and affects the ring and little finger on his right hand with associated numbness and tingling. Sometimes he'll experience numbness and tingling affecting the entire hand as well. He is failed conservative therapy whereas exercises stretching and pain medication they'll to alleviate his pain. He describes this as a maximum VAS of 10 best a 3 worse with activity and not influenced by time of day. The pain is worse with lifting and improves with medication management nerve block techniques as mentioned cold and warm showers and massage. The pain wakes him up at night and is described as throbbing shooting horrible nagging pressure-like and pulsating. He's had previous MRI and  neurologic evaluation as well as neurosurgical evaluation PNCV's and nerve blocks.  His MRI is unavailable to me but as per the dictation dated 8 2011 for his referring clinic it shows a cervical spine film 2010 with a left paracentral disc herniation at C6-C7 with some impingement and compression of the spinal cord and narrowing the left neural foramen at C4-C6 there is some reversal of normal cervical lordosis and at C5-6 central disc protrusion with broad-based disc and osteophyte complex with narrowing the left neural foramen bilaterally right greater than left at C5-6. There is broad-based disc and osteophyte complex at C3-4 with mild narrowing the left neural foramen and at C4-5 there is a moderate prominent broad-based disc bulge with narrowing of the right neural foramen  History Isaiah Johnson has a past medical history of Chicken pox; Colon polyps; H/O rubella; History of measles; Hyperlipidemia; and Psoriasis.   He has a past surgical history that includes Cholecystectomy (1996).   His family history includes Depression in his father; Diabetes in his mother; Heart disease in his father and mother.He reports that he has never smoked. He has never used smokeless tobacco. He reports that he drinks alcohol. He reports that he does not use drugs.  No results found for this or any previous visit.  ToxAssure Select 13  Date Value Ref Range Status  01/25/2016 FINAL  Final    Comment:    ==================================================================== TOXASSURE SELECT 13 (MW) ==================================================================== Test  Result       Flag       Units   NO DRUGS DETECTED. ==================================================================== Test                      Result    Flag   Units      Ref Range   Creatinine              207              mg/dL      >=13>=20 ==================================================================== Declared  Medications:  The flagging and interpretation on this report are based on the  following declared medications.  Unexpected results may arise from  inaccuracies in the declared medications.  **Note: The testing scope of this panel does not include following  reported medications:  Aspirin (Aspirin 81)  Fenofibrate (Lofibra)  Meloxicam (Mobic)  Multivitamin  Niacin  Omega-3 Fatty Acids (Fish Oil)  Pravastatin (Pravachol)  Topical (Lidex)  Triamcinolone (Kenalog)  Vitamin B (Super B Complex)  Vitamin E ==================================================================== For clinical consultation, please call [phone]. ====================================================================     Outpatient Medications Prior to Visit  Medication Sig Dispense Refill  . aspirin EC 81 MG tablet Take 81 mg by mouth daily.    Marland Kitchen. b complex vitamins capsule Take 1 capsule by mouth daily.    . fenofibrate micronized (LOFIBRA) 67 MG capsule Take 1 capsule (67 mg total) by mouth daily before breakfast. 90 capsule 3  . fluocinonide cream (LIDEX) 0.05 % Apply 1 application topically 2 (two) times daily as needed.    . meloxicam (MOBIC) 15 MG tablet Take 0.5-1 tablets (7.5-15 mg total) by mouth daily as needed for pain. 30 tablet 2  . Multiple Vitamin (MULTIVITAMIN WITH MINERALS) TABS tablet Take 1 tablet by mouth daily.    . niacin 500 MG tablet Take 500 mg by mouth at bedtime.    . Omega-3 Fatty Acids (FISH OIL) 1000 MG CAPS Take 1,000 mg by mouth daily.    . pravastatin (PRAVACHOL) 40 MG tablet Take 1 tablet (40 mg total) by mouth daily. 90 tablet 3  . triamcinolone cream (KENALOG) 0.1 % Apply 1 application topically daily as needed.    . vitamin E (VITAMIN E) 1000 UNIT capsule Take 1,000 Units by mouth daily.     Facility-Administered Medications Prior to Visit  Medication Dose Route Frequency Provider Last Rate Last Dose  . dexamethasone (DECADRON) injection 10 mg  10 mg Other Once Yevette EdwardsJames G  Arijana Narayan, MD      . ropivacaine (PF) 2 mg/ml (0.2%) (NAROPIN) epidural 10 mL  10 mL Epidural Once Yevette EdwardsJames G Deandra Gadson, MD       Lab Results  Component Value Date   GLUCOSE 90 01/05/2016   CHOL 139 01/05/2016   TRIG 91 01/05/2016   HDL 33 (L) 01/05/2016   LDLCALC 88 01/05/2016   ALT 24 01/05/2016   AST 20 01/05/2016   NA 138 01/05/2016   K 4.6 01/05/2016   CL 105 01/05/2016   CREATININE 0.98 01/05/2016   BUN 19 01/05/2016   CO2 22 01/05/2016    --------------------------------------------------------------------------------------------------------------------- Patient was never admitted.     ---------------------------------------------------------------------------------------------------------------------- Past Medical History:  Diagnosis Date  . Chicken pox   . Colon polyps   . H/O rubella   . History of measles   . Hyperlipidemia   . Psoriasis     Past Surgical History:  Procedure Laterality Date  . CHOLECYSTECTOMY  1996  Family History  Problem Relation Age of Onset  . Diabetes Mother   . Heart disease Mother   . Heart disease Father   . Depression Father     Social History  Substance Use Topics  . Smoking status: Never Smoker  . Smokeless tobacco: Never Used  . Alcohol use Yes     Comment: 1 drink    ---------------------------------------------------------------------------------------------------------------------- Social History   Social History  . Marital status: Married    Spouse name: Chiropodist  . Number of children: N/A  . Years of education: High school   Occupational History  . Environmental consultant    Social History Main Topics  . Smoking status: Never Smoker  . Smokeless tobacco: Never Used  . Alcohol use Yes     Comment: 1 drink  . Drug use: No  . Sexual activity: Not Asked   Other Topics Concern  . None   Social History Narrative  . None    Scheduled Meds: . dexamethasone  10 mg Other Once  . ropivacaine (PF) 2  mg/ml (0.2%)  10 mL Epidural Once   Continuous Infusions: PRN Meds:.   BP 126/83   Pulse 68   Temp 98.2 F (36.8 C) (Oral)   Resp 16   Ht 5' 8.5" (1.74 m)   Wt 240 lb (108.9 kg)   SpO2 97%   BMI 35.96 kg/m    BP Readings from Last 3 Encounters:  02/27/16 126/83  01/25/16 127/83  12/13/15 118/86     Wt Readings from Last 3 Encounters:  02/27/16 240 lb (108.9 kg)  01/25/16 240 lb (108.9 kg)  12/13/15 243 lb (110.2 kg)     ----------------------------------------------------------------------------------------------------------------------  ROS Review of Systems  No interval changes are noted  Objective:  BP 126/83   Pulse 68   Temp 98.2 F (36.8 C) (Oral)   Resp 16   Ht 5' 8.5" (1.74 m)   Wt 240 lb (108.9 kg)   SpO2 97%   BMI 35.96 kg/m   Physical Exam patient is a pleasant white male in no acute distress alert and oriented 3 cooperative compliant. Pupils are equally round reactive light extraocular muscles intact Heart is regular rate and rhythm without murmur Lungs clear to also dictation Inspection the right trapezius reveals 2 trigger points in the proximal mid body posteriorly. He has mild crepitus with active range of motion at the occipital joint with anterior-posterior flexion extension He has limited lateral range of motion and his strength is 5 over 5 both proximal and distal in the upper extremities. Sensation appears to be grossly intact as is hand grasp. No other changes noted on examination    Assessment & Plan:   Duval was seen today for neck pain and shoulder pain.  Diagnoses and all orders for this visit:  Cervicalgia  Cervical radiculitis     ----------------------------------------------------------------------------------------------------------------------  Problem List Items Addressed This Visit    None    Visit Diagnoses    Cervicalgia    -  Primary   Cervical radiculitis           ----------------------------------------------------------------------------------------------------------------------  1. DDD (degenerative disc disease), cervical At this point he feels like he is doing reasonably well. He would like to forego the cervical epidural that was scheduled for today. If the pain exacerbates and he will call for work in at which point we would proceed with a cervical epidural injection. 2. Cervical radiculitis As above - Cervical Epidural Injection; Future - ToxASSURE Select 13 (  MW), Urine  3. Cervicalgia We'll defer on any repeat injection today and have him continue with his stretching strengthening exercises and we will also talked about positioning management regarding sleep and general posture. 4. Cervical facet syndrome     ----------------------------------------------------------------------------------------------------------------------  I am having Mr. Jacques NavyClapper maintain his multivitamin with minerals, vitamin E, aspirin EC, niacin, Fish Oil, fluocinonide cream, triamcinolone cream, fenofibrate micronized, meloxicam, pravastatin, and b complex vitamins. We will continue to administer ropivacaine (PF) 2 mg/ml (0.2%) and dexamethasone.   No orders of the defined types were placed in this encounter.    Follow-up: Return in about 3 months (around 05/29/2016) for evaluation, procedure.    Yevette EdwardsJames G Lenka Zhao, MD  This dictation was performed utilizing Dragon voice recognition software.  Please excuse any unintentional or mistaken typographical errors as a result of its unedited utilization.

## 2016-03-07 ENCOUNTER — Other Ambulatory Visit: Payer: Self-pay | Admitting: Family Medicine

## 2016-03-07 DIAGNOSIS — M542 Cervicalgia: Secondary | ICD-10-CM

## 2016-03-07 DIAGNOSIS — M722 Plantar fascial fibromatosis: Secondary | ICD-10-CM

## 2016-03-07 DIAGNOSIS — G8929 Other chronic pain: Secondary | ICD-10-CM

## 2016-04-13 ENCOUNTER — Other Ambulatory Visit: Payer: Self-pay | Admitting: Family Medicine

## 2016-04-13 DIAGNOSIS — E785 Hyperlipidemia, unspecified: Secondary | ICD-10-CM

## 2016-04-13 MED ORDER — FENOFIBRATE MICRONIZED 67 MG PO CAPS: 67.0000 mg | ORAL_CAPSULE | Freq: Every day | ORAL | 1 refills | Status: DC

## 2016-06-18 ENCOUNTER — Other Ambulatory Visit: Payer: Self-pay | Admitting: Family Medicine

## 2016-06-18 DIAGNOSIS — M542 Cervicalgia: Secondary | ICD-10-CM

## 2016-06-18 DIAGNOSIS — G8929 Other chronic pain: Secondary | ICD-10-CM

## 2016-06-18 DIAGNOSIS — M722 Plantar fascial fibromatosis: Secondary | ICD-10-CM

## 2016-07-04 ENCOUNTER — Encounter: Payer: Self-pay | Admitting: Family Medicine

## 2016-09-06 ENCOUNTER — Encounter: Payer: Self-pay | Admitting: Family Medicine

## 2016-09-06 DIAGNOSIS — Z1211 Encounter for screening for malignant neoplasm of colon: Secondary | ICD-10-CM

## 2016-09-06 DIAGNOSIS — K635 Polyp of colon: Secondary | ICD-10-CM

## 2016-09-07 NOTE — Telephone Encounter (Signed)
Placed referral to [facility] for colonoscopy.  Isaiah PilarAlexander Karamalegos, DO [facility] 09/07/2016, 12:25 PM

## 2016-09-09 ENCOUNTER — Other Ambulatory Visit: Payer: Self-pay | Admitting: Family Medicine

## 2016-09-09 DIAGNOSIS — M542 Cervicalgia: Secondary | ICD-10-CM

## 2016-09-09 DIAGNOSIS — M722 Plantar fascial fibromatosis: Secondary | ICD-10-CM

## 2016-09-09 DIAGNOSIS — G8929 Other chronic pain: Secondary | ICD-10-CM

## 2016-09-20 ENCOUNTER — Telehealth: Payer: Self-pay

## 2016-09-20 ENCOUNTER — Other Ambulatory Visit: Payer: Self-pay

## 2016-09-20 DIAGNOSIS — K635 Polyp of colon: Secondary | ICD-10-CM

## 2016-09-20 NOTE — Telephone Encounter (Signed)
Gastroenterology Pre-Procedure Review  Request Date: 10/23/16 Requesting Physician: Dr. Servando SnareWohl  PATIENT REVIEW QUESTIONS: The patient responded to the following health history questions as indicated:    1. Are you having any GI issues? no 2. Do you have a personal history of Polyps? yes (Colon Polyps) 3. Do you have a family history of Colon Cancer or Polyps? no 4. Diabetes Mellitus? no 5. Joint replacements in the past 12 months?no 6. Major health problems in the past 3 months?no 7. Any artificial heart valves, MVP, or defibrillator?no    MEDICATIONS & ALLERGIES:    Patient reports the following regarding taking any anticoagulation/antiplatelet therapy:   Plavix, Coumadin, Eliquis, Xarelto, Lovenox, Pradaxa, Brilinta, or Effient? no Aspirin? no  Patient confirms/reports the following medications:  Current Outpatient Prescriptions  Medication Sig Dispense Refill  . aspirin EC 81 MG tablet Take 81 mg by mouth daily.    Marland Kitchen. b complex vitamins capsule Take 1 capsule by mouth daily.    . fenofibrate micronized (LOFIBRA) 67 MG capsule Take 1 capsule (67 mg total) by mouth daily before breakfast. 90 capsule 1  . fluocinonide cream (LIDEX) 0.05 % Apply 1 application topically 2 (two) times daily as needed.    . meloxicam (MOBIC) 15 MG tablet TAKE 0.5-1 TABLETS (7.5-15 MG TOTAL) BY MOUTH DAILY AS NEEDED FOR PAIN. 30 tablet 2  . Multiple Vitamin (MULTIVITAMIN WITH MINERALS) TABS tablet Take 1 tablet by mouth daily.    . niacin 500 MG tablet Take 500 mg by mouth at bedtime.    . Omega-3 Fatty Acids (FISH OIL) 1000 MG CAPS Take 1,000 mg by mouth daily.    . pravastatin (PRAVACHOL) 40 MG tablet Take 1 tablet (40 mg total) by mouth daily. 90 tablet 3  . triamcinolone cream (KENALOG) 0.1 % Apply 1 application topically daily as needed.    . vitamin E (VITAMIN E) 1000 UNIT capsule Take 1,000 Units by mouth daily.     Current Facility-Administered Medications  Medication Dose Route Frequency Provider  Last Rate Last Dose  . dexamethasone (DECADRON) injection 10 mg  10 mg Other Once Yevette EdwardsAdams, James G, MD      . ropivacaine (PF) 2 mg/ml (0.2%) (NAROPIN) epidural 10 mL  10 mL Epidural Once Yevette EdwardsAdams, James G, MD        Patient confirms/reports the following allergies:  No Known Allergies  No orders of the defined types were placed in this encounter.   AUTHORIZATION INFORMATION Primary Insurance: 1D#: Group #:  Secondary Insurance: 1D#: Group #:  SCHEDULE INFORMATION: Date: 10/23/16 Time: Location:[facility]

## 2016-10-18 ENCOUNTER — Telehealth: Payer: Self-pay | Admitting: Gastroenterology

## 2016-10-18 NOTE — Telephone Encounter (Signed)
Patient called and needs to cancel his procedure. He will call back when he is able to reschedule.

## 2016-10-18 NOTE — Telephone Encounter (Signed)
Patient canceled his colonoscopy. He will be out of town and they will call back to reschedule. I called [facility]

## 2016-10-23 ENCOUNTER — Ambulatory Visit: Admit: 2016-10-23 | Payer: BLUE CROSS/BLUE SHIELD | Admitting: Gastroenterology

## 2016-10-23 SURGERY — COLONOSCOPY WITH PROPOFOL
Anesthesia: General

## 2016-11-13 ENCOUNTER — Other Ambulatory Visit: Payer: Self-pay | Admitting: Family Medicine

## 2016-11-13 DIAGNOSIS — E785 Hyperlipidemia, unspecified: Secondary | ICD-10-CM

## 2016-12-09 ENCOUNTER — Other Ambulatory Visit: Payer: Self-pay | Admitting: Family Medicine

## 2016-12-09 DIAGNOSIS — M542 Cervicalgia: Secondary | ICD-10-CM

## 2016-12-09 DIAGNOSIS — G8929 Other chronic pain: Secondary | ICD-10-CM

## 2016-12-09 DIAGNOSIS — M722 Plantar fascial fibromatosis: Secondary | ICD-10-CM

## 2017-01-09 DIAGNOSIS — L280 Lichen simplex chronicus: Secondary | ICD-10-CM | POA: Diagnosis not present

## 2017-03-06 ENCOUNTER — Other Ambulatory Visit: Payer: Self-pay | Admitting: Family Medicine

## 2017-03-06 DIAGNOSIS — E785 Hyperlipidemia, unspecified: Secondary | ICD-10-CM

## 2017-04-16 ENCOUNTER — Other Ambulatory Visit: Payer: Self-pay | Admitting: Family Medicine

## 2017-04-16 DIAGNOSIS — M722 Plantar fascial fibromatosis: Secondary | ICD-10-CM

## 2017-04-16 DIAGNOSIS — G8929 Other chronic pain: Secondary | ICD-10-CM

## 2017-04-16 DIAGNOSIS — M542 Cervicalgia: Secondary | ICD-10-CM

## 2017-04-20 ENCOUNTER — Other Ambulatory Visit: Payer: Self-pay | Admitting: Family Medicine

## 2017-04-20 DIAGNOSIS — M722 Plantar fascial fibromatosis: Secondary | ICD-10-CM

## 2017-04-20 DIAGNOSIS — M542 Cervicalgia: Secondary | ICD-10-CM

## 2017-04-20 DIAGNOSIS — G8929 Other chronic pain: Secondary | ICD-10-CM

## 2017-05-07 ENCOUNTER — Encounter: Payer: Self-pay | Admitting: Family Medicine

## 2017-05-07 ENCOUNTER — Other Ambulatory Visit: Payer: Self-pay | Admitting: Family Medicine

## 2017-05-07 DIAGNOSIS — E785 Hyperlipidemia, unspecified: Secondary | ICD-10-CM

## 2017-05-22 ENCOUNTER — Encounter: Payer: Self-pay | Admitting: Family Medicine

## 2017-05-23 ENCOUNTER — Other Ambulatory Visit: Payer: Self-pay | Admitting: Family Medicine

## 2017-05-23 DIAGNOSIS — Z1211 Encounter for screening for malignant neoplasm of colon: Secondary | ICD-10-CM

## 2017-05-29 ENCOUNTER — Ambulatory Visit (INDEPENDENT_AMBULATORY_CARE_PROVIDER_SITE_OTHER): Payer: BLUE CROSS/BLUE SHIELD | Admitting: Family Medicine

## 2017-05-29 ENCOUNTER — Encounter: Payer: Self-pay | Admitting: Family Medicine

## 2017-05-29 VITALS — BP 125/74 | HR 78 | Temp 98.5°F | Resp 16 | Ht 69.0 in | Wt 249.6 lb

## 2017-05-29 DIAGNOSIS — G8929 Other chronic pain: Secondary | ICD-10-CM

## 2017-05-29 DIAGNOSIS — E669 Obesity, unspecified: Secondary | ICD-10-CM | POA: Diagnosis not present

## 2017-05-29 DIAGNOSIS — E7849 Other hyperlipidemia: Secondary | ICD-10-CM

## 2017-05-29 DIAGNOSIS — Z0000 Encounter for general adult medical examination without abnormal findings: Secondary | ICD-10-CM | POA: Diagnosis not present

## 2017-05-29 DIAGNOSIS — M47812 Spondylosis without myelopathy or radiculopathy, cervical region: Secondary | ICD-10-CM | POA: Diagnosis not present

## 2017-05-29 DIAGNOSIS — M542 Cervicalgia: Secondary | ICD-10-CM

## 2017-05-29 DIAGNOSIS — K635 Polyp of colon: Secondary | ICD-10-CM | POA: Diagnosis not present

## 2017-05-29 DIAGNOSIS — Z0001 Encounter for general adult medical examination with abnormal findings: Secondary | ICD-10-CM

## 2017-05-29 DIAGNOSIS — Z125 Encounter for screening for malignant neoplasm of prostate: Secondary | ICD-10-CM

## 2017-05-29 MED ORDER — MELOXICAM 15 MG PO TABS: 15.0000 mg | ORAL_TABLET | Freq: Every day | ORAL | 1 refills | Status: DC | PRN

## 2017-05-29 MED ORDER — FENOFIBRATE MICRONIZED 67 MG PO CAPS: 67.0000 mg | ORAL_CAPSULE | Freq: Every day | ORAL | 3 refills | Status: DC

## 2017-05-29 MED ORDER — PRAVASTATIN SODIUM 40 MG PO TABS: 40.0000 mg | ORAL_TABLET | Freq: Every day | ORAL | 3 refills | Status: DC

## 2017-05-29 MED ORDER — BACLOFEN 10 MG PO TABS
5.0000 mg | ORAL_TABLET | Freq: Three times a day (TID) | ORAL | 2 refills | Status: DC | PRN
Start: 2017-05-29 — End: 2017-08-05

## 2017-05-29 NOTE — Assessment & Plan Note (Signed)
Chronic problem with dyslipidemia, mild low HDL Due fasting lipids, he did not return for labs previously Check lipids today with rest of labs Refilled current Pravastatin 40mg  daily (mod intensity), Fenofibrate 67mg  daily, Niacin 500mg  nightly, with ASA 81

## 2017-05-29 NOTE — Assessment & Plan Note (Signed)
See A&P for DJD cervical spine

## 2017-05-29 NOTE — Assessment & Plan Note (Signed)
Suspected etiology of chronic neck pain and stiffness [facility] Dr Pernell DupreAdams, prior ESI injection 2017 Prior flare up with upper arm activity, now improved, still some numbness and pain intermittently  Plan Advised to re-schedule with Dr Fayrene FearingJames in future For now try new rx Baclofen PRN caution sedation Inc Tylenol dosing Refilled Meloxicam - written instructions to use more PRN basis 1-2 week per flare only not daily Continue massage and other supportive care Follow-up as needed

## 2017-05-29 NOTE — Assessment & Plan Note (Signed)
Chronic problem with BMI >35, HLD, and risk factors with family history of heart disease and diabetes.  Plan: 1. Encouraged to continue improved lifestyle modifications with regular aerobic exercise now improved, dietary modifications - Check fasting labs today for chemistry A1c lipids

## 2017-05-29 NOTE — Patient Instructions (Addendum)
Thank you for coming to the office today.  1.  For neck pain and stiffness  Start taking Baclofen (Lioresal) 10mg  (muscle relaxant) - start with half (cut) to one whole pill at night as needed for next 1-3 nights (may make you drowsy, caution with driving) see how it affects you, then if tolerated increase to one pill 2 to 3 times a day or (every 8 hours as needed)  Consider contact Dr Pernell DupreAdams once more to follow-up on injections or other therapy  Continue massage therapy  2. Refilled Fenofibrate, Meloxicam Pravastatin  Try to REDUCE Meloxicam usage - take whole pill 15mg  daily as needed - use for 1-2 weeks or less then stop for few days to week to see if can avoid using regularly  TAKE MORE Tylenol  Recommend to start taking Tylenol Extra Strength 500mg  tabs - take 1 to 2 tabs per dose (max 1000mg ) every 6-8 hours for pain (take regularly, don't skip a dose for next 7 days), max 24 hour daily dose is 6 tablets or 3000mg . In the future you can repeat the same everyday Tylenol course for 1-2 weeks at a time.   3.  We have already sent referral back to Dr Norma Fredricksonoledo for Colonoscopy - stay tuned, call them or us if still not heard back within 1-2 weeks  DUE for FASTING BLOOD WORK (no food or drink after midnight before the lab appointment, only water or coffee without cream/sugar on the morning of)  LABS TODAY - stay tuned for results  For Lab Results, once available within 2-3 days of blood draw, you can can log in to MyChart online to view your results and a brief explanation. Also, we can discuss results at next follow-up visit.   Please schedule a Follow-up Appointment to: Return in about 1 year (around 05/29/2018) for Annual Physical.    If you have any other questions or concerns, please feel free to call the office or send a message through MyChart. You may also schedule an earlier appointment if necessary.  Additionally, you may be receiving a survey about your experience at our  office within a few days to 1 week by e-mail or mail. We value your feedback.  Saralyn PilarAlexander Tycen Dockter, DO [facility]

## 2017-05-29 NOTE — Assessment & Plan Note (Addendum)
Overdue to repeat routine colonoscopy for surveillance of colon polyps Last colonoscopy 2013, next due 5 years, since 2018 overdue Fam history colon CA maternal uncle age 6260s Referral has already been placed and [facility] Dr Norma Fredricksonoledo - still pending

## 2017-05-29 NOTE — Progress Notes (Signed)
Subjective:    Patient ID: Isaiah Johnson, male    DOB: 17-Sep-1965, 52 y.o.   MRN: [id number]  Isaiah Johnson is a 52 y.o. male presenting on 05/29/2017 for Hyperlipidemia (needs refill) and Annual Exam   HPI   Here for Annual Physical and due for fasting labs today.  HYPERLIPIDEMIA / Low HDL / Obesity BMI >36 - Reports no concerns. Last lipid panel 2017, mostly controlled except mild low HDL - Currently taking Pravastatin 40mg  and Fenofibrate 67mg  daily, tolerating well without side effects or myalgias Lifestyle - Diet: Mostly unchanged diet still mostly balanced, admits room for improvement - recent trip to [place] gained some wt - Exercise: now more walking cardio exercise BID with wife, he is doing less vigorous gym or swimming exercise due to affecting his arms/shoulders see below - Taking ASA 81  Chronic Neck / Shoulder Pain / History of DJD Cervical  Previously followed by Dr Windy Carina [facility]) last visit 01/2016, had prior steroid injection in neck for cervicalgia, he has not returned and has not been able to schedule follow-up he says waiting on call back. - He states still stiffness is primary concern, occasional some symptoms radiate down R arm with some numbness or tingling, admits neck pain at times ,now improved, had been more flared up when he started swimming and using arms more and thinks it was flaring up neck pain worse Now he is typically going for walk twice a day with cardio, instead of less vigorous exercise using arms - Recently tried massage therapy with some relief - He had been on opiates in the past and did not tolerate muscle relaxants well - He is not taking Tylenol regularly - Taking Meloxicam 15mg  daily in AM, seems to do well, taking regularly and not skipping many doses    Health Maintenance: UTD Flu vaccine, TDap UTD routine HIV screening  Prostate CA Screening: No prior prostate CA screening by his report, no lab PSA available in  record. Currently asymptomatic. No known family history of prostate CA. Due for screening at age 54. Agrees to proceed with initial PSA testing  Colon CA Screening: Last Colonoscopy 2013 (done by [facility] Dr Norma Fredrickson), results with polyps, good for 5 years, overdue to return since 2018. Currently asymptomatic. Known family history of colon CA, maternal uncle age 48s. Due for screening test return to GI for colonoscopy - already placed referral last week - patient awaiting apt with Dr Norma Fredrickson, he states have not called yet.  Depression screen [facility] 2/9 05/29/2017 02/27/2016 01/25/2016  Decreased Interest 0 0 0  Down, Depressed, Hopeless 0 0 0  PHQ - 2 Score 0 0 0    Past Medical History:  Diagnosis Date  . Chicken pox   . Colon polyps   . H/O rubella   . History of measles   . Hyperlipidemia   . Psoriasis    Past Surgical History:  Procedure Laterality Date  . CHOLECYSTECTOMY  1996   Social History   Socioeconomic History  . Marital status: Married    Spouse name: Chiropodist  . Number of children: Not on file  . Years of education: High school  . Highest education level: Not on file  Social Needs  . Financial resource strain: Not on file  . Food insecurity - worry: Not on file  . Food insecurity - inability: Not on file  . Transportation needs - medical: Not on file  . Transportation needs - non-medical: Not on  file  Occupational History  . Occupation: Environmental consultant  Tobacco Use  . Smoking status: Never Smoker  . Smokeless tobacco: Never Used  Substance and Sexual Activity  . Alcohol use: Yes    Comment: 1 drink  . Drug use: No  . Sexual activity: Not on file  Other Topics Concern  . Not on file  Social History Narrative  . Not on file   Family History  Problem Relation Age of Onset  . Diabetes Mother   . Heart disease Mother   . Heart disease Father   . Depression Father   . Sleep apnea Brother   . Colon cancer Maternal Uncle   . Prostate cancer  Neg Hx    Current Outpatient Medications on File Prior to Visit  Medication Sig  . aspirin EC 81 MG tablet Take 81 mg by mouth daily.  Marland Kitchen b complex vitamins capsule Take 1 capsule by mouth daily.  . fluocinonide cream (LIDEX) 0.05 % Apply 1 application topically 2 (two) times daily as needed.  . Multiple Vitamin (MULTIVITAMIN WITH MINERALS) TABS tablet Take 1 tablet by mouth daily.  . Omega-3 Fatty Acids (FISH OIL) 1000 MG CAPS Take 1,000 mg by mouth daily.  Marland Kitchen triamcinolone cream (KENALOG) 0.1 % Apply 1 application topically daily as needed.  . vitamin E (VITAMIN E) 1000 UNIT capsule Take 1,000 Units by mouth daily.  . niacin 500 MG tablet Take 500 mg by mouth at bedtime.   No current facility-administered medications on file prior to visit.     Review of Systems  Constitutional: Negative for activity change, appetite change, chills, diaphoresis, fatigue, fever and unexpected weight change.  HENT: Negative for congestion, hearing loss and sinus pressure.   Eyes: Negative for visual disturbance.  Respiratory: Negative for apnea, cough, choking, chest tightness, shortness of breath and wheezing.   Cardiovascular: Negative for chest pain, palpitations and leg swelling.  Gastrointestinal: Negative for abdominal pain, anal bleeding, blood in stool, constipation, diarrhea, nausea and vomiting.  Endocrine: Negative for cold intolerance.  Genitourinary: Negative for decreased urine volume, difficulty urinating, dysuria, frequency, hematuria, testicular pain and urgency.  Musculoskeletal: Negative for arthralgias, back pain and neck pain.  Skin: Negative for rash.  Allergic/Immunologic: Negative for environmental allergies.  Neurological: Negative for dizziness, weakness, light-headedness, numbness and headaches.  Hematological: Negative for adenopathy.  Psychiatric/Behavioral: Negative for behavioral problems, dysphoric mood and sleep disturbance. The patient is not nervous/anxious.    Per HPI  unless specifically indicated above     Objective:    BP 125/74   Pulse 78   Temp 98.5 F (36.9 C) (Oral)   Resp 16   Ht 5\' 9"  (1.753 m)   Wt 249 lb 9.6 oz (113.2 kg)   BMI 36.86 kg/m   Wt Readings from Last 3 Encounters:  05/29/17 249 lb 9.6 oz (113.2 kg)  02/27/16 240 lb (108.9 kg)  01/25/16 240 lb (108.9 kg)    Physical Exam  Constitutional: He is oriented to person, place, and time. He appears well-developed and well-nourished. No distress.  Well-appearing, comfortable, cooperative, obese  HENT:  Head: Normocephalic and atraumatic.  Mouth/Throat: Oropharynx is clear and moist.  Frontal / maxillary sinuses non-tender. Nares patent without purulence or edema. Bilateral TMs partially obscured by cerumen otherwise clear without erythema, effusion or bulging. Oropharynx clear without erythema, exudates, edema or asymmetry.  Eyes: Conjunctivae and EOM are normal. Pupils are equal, round, and reactive to light. Right eye exhibits no discharge. Left eye exhibits  no discharge.  Neck: Normal range of motion. Neck supple. No thyromegaly present.  No carotid bruits  Neck Inspection: mostly normal appearance mild muscle hypertonicity L trapezius and paraspinal region Palpation: non tender ROM: slightly reduced active ROM flex/ext rotate R with stiffness but seems improved Special Testing: did not re-check spurling's maneuver today Strength: distal intact Neurovascular: distal intact  Cardiovascular: Normal rate, regular rhythm, normal heart sounds and intact distal pulses.  No murmur heard. Pulmonary/Chest: Effort normal and breath sounds normal. No respiratory distress. He has no wheezes. He has no rales.  Abdominal: Soft. Bowel sounds are normal. He exhibits no distension and no mass. There is no tenderness.  Musculoskeletal: Normal range of motion. He exhibits no edema or tenderness.  Upper / Lower Extremities: - Normal muscle tone, strength bilateral upper extremities 5/5, lower  extremities 5/5  Lymphadenopathy:    He has no cervical adenopathy.  Neurological: He is alert and oriented to person, place, and time.  Distal sensation intact to light touch all extremities  Skin: Skin is warm and dry. No rash noted. He is not diaphoretic. No erythema.  Psychiatric: He has a normal mood and affect. His behavior is normal.  Well groomed, good eye contact, normal speech and thoughts  Nursing note and vitals reviewed.  Results for orders placed or performed in visit on 01/25/16  ToxASSURE Select 13 (MW), Urine  Result Value Ref Range   ToxAssure Select 13 FINAL       Assessment & Plan:   Problem List Items Addressed This Visit    Chronic neck pain    See A&P for DJD cervical spine      Relevant Medications   meloxicam (MOBIC) 15 MG tablet   baclofen (LIORESAL) 10 MG tablet   Colon polyps    Overdue to repeat routine colonoscopy for surveillance of colon polyps Last colonoscopy 2013, next due 5 years, since 2018 overdue Fam history colon CA maternal uncle age 1s Referral has already been placed and faxed back to [facility] Dr Norma Fredrickson - still pending      DJD (degenerative joint disease) of cervical spine    Suspected etiology of chronic neck pain and stiffness Previously followed by [facility] Dr Pernell Dupre, prior ESI injection 2017 Prior flare up with upper arm activity, now improved, still some numbness and pain intermittently  Plan Advised to re-schedule with Dr Fayrene Fearing in future For now try new rx Baclofen PRN caution sedation Inc Tylenol dosing Refilled Meloxicam - written instructions to use more PRN basis 1-2 week per flare only not daily Continue massage and other supportive care Follow-up as needed      Relevant Medications   meloxicam (MOBIC) 15 MG tablet   baclofen (LIORESAL) 10 MG tablet   Hyperlipidemia    Chronic problem with dyslipidemia, mild low HDL Due fasting lipids, he did not return for labs previously Check lipids today with rest  of labs Refilled current Pravastatin 40mg  daily (mod intensity), Fenofibrate 67mg  daily, Niacin 500mg  nightly, with ASA 81      Relevant Medications   pravastatin (PRAVACHOL) 40 MG tablet   fenofibrate micronized (LOFIBRA) 67 MG capsule   Other Relevant Orders   Lipid panel   Obesity (BMI 35.0-39.9 without comorbidity)    Chronic problem with BMI >35, HLD, and risk factors with family history of heart disease and diabetes.  Plan: 1. Encouraged to continue improved lifestyle modifications with regular aerobic exercise now improved, dietary modifications - Check fasting labs today for chemistry  A1c lipids      Relevant Orders   COMPLETE METABOLIC PANEL WITH GFR   Hemoglobin A1c    Other Visit Diagnoses    Annual physical exam    -  Primary UTD Health maintenance [facility] Encourage continue to improve goals with healthy lifestyle, diet, exercise    Relevant Orders   COMPLETE METABOLIC PANEL WITH GFR   Hemoglobin A1c   CBC with Differential/Platelet   Lipid panel   Screening for prostate cancer      Low risk patient, with prior reported negative screening - Clinically asymptomatic  Plan: 1. Reviewed prostate cancer screening guidelines and risks including potential prostate biopsy if abnormal PSA, proceed with yearly PSA for now, and anticipate DRE as needed especially if abnormal PSA or new symptoms     Relevant Orders   PSA, Total with Reflex to PSA, Free      Meds ordered this encounter  Medications  . meloxicam (MOBIC) 15 MG tablet    Sig: Take 1 tablet (15 mg total) by mouth daily as needed for pain. Take 1-2 weeks at a time, may hold doses if not flare up    Dispense:  90 tablet    Refill:  1  . pravastatin (PRAVACHOL) 40 MG tablet    Sig: Take 1 tablet (40 mg total) by mouth daily.    Dispense:  90 tablet    Refill:  3  . fenofibrate micronized (LOFIBRA) 67 MG capsule    Sig: Take 1 capsule (67 mg total) by mouth daily  before breakfast.    Dispense:  90 capsule    Refill:  3  . baclofen (LIORESAL) 10 MG tablet    Sig: Take 0.5-1 tablets (5-10 mg total) by mouth 3 (three) times daily as needed for muscle spasms.    Dispense:  60 each    Refill:  2    Follow up plan: Return in about 1 year (around 05/29/2018) for Annual Physical.  Will plan to order future labs again for 05/2018 once review current labs.  Saralyn PilarAlexander Karamalegos, DO [facility] 05/29/2017, 1:00 PM

## 2017-05-30 LAB — COMPLETE METABOLIC PANEL WITH GFR
AG RATIO: 2 (calc) (ref 1.0–2.5)
ALT: 25 U/L (ref 9–46)
AST: 20 U/L (ref 10–35)
Albumin: 4.5 g/dL (ref 3.6–5.1)
Alkaline phosphatase (APISO): 73 U/L (ref 40–115)
BUN: 20 mg/dL (ref 7–25)
CALCIUM: 9.5 mg/dL (ref 8.6–10.3)
CO2: 26 mmol/L (ref 20–32)
CREATININE: 1.01 mg/dL (ref 0.70–1.33)
Chloride: 104 mmol/L (ref 98–110)
GFR, EST AFRICAN AMERICAN: 100 mL/min/{1.73_m2} (ref >=60)
GFR, EST NON AFRICAN AMERICAN: 86 mL/min/{1.73_m2} (ref >=60)
GLOBULIN: 2.2 g/dL (ref 1.9–3.7)
Glucose, Bld: 88 mg/dL (ref 65–99)
Potassium: 4.6 mmol/L (ref 3.5–5.3)
SODIUM: 139 mmol/L (ref 135–146)
TOTAL PROTEIN: 6.7 g/dL (ref 6.1–8.1)
Total Bilirubin: 0.6 mg/dL (ref 0.2–1.2)

## 2017-05-30 LAB — HEMOGLOBIN A1C
Hgb A1c MFr Bld: 5.4 % of total Hgb (ref <5.7)
MEAN PLASMA GLUCOSE: 108 (calc)
eAG (mmol/L): 6 (calc)

## 2017-05-30 LAB — CBC WITH DIFFERENTIAL/PLATELET
Basophils Absolute: 64 cells/uL (ref 0–200)
Basophils Relative: 0.7 %
EOS ABS: 482 {cells}/uL (ref 15–500)
Eosinophils Relative: 5.3 %
HCT: 47.5 % (ref 38.5–50.0)
Hemoglobin: 16.3 g/dL (ref 13.2–17.1)
Lymphs Abs: 2075 cells/uL (ref 850–3900)
MCH: 28.7 pg (ref 27.0–33.0)
MCHC: 34.3 g/dL (ref 32.0–36.0)
MCV: 83.8 fL (ref 80.0–100.0)
MONOS PCT: 6 %
MPV: 10.4 fL (ref 7.5–12.5)
NEUTROS PCT: 65.2 %
Neutro Abs: 5933 cells/uL (ref 1500–7800)
PLATELETS: 303 10*3/uL (ref 140–400)
RBC: 5.67 10*6/uL (ref 4.20–5.80)
RDW: 12.4 % (ref 11.0–15.0)
TOTAL LYMPHOCYTE: 22.8 %
WBC mixed population: 546 cells/uL (ref 200–950)
WBC: 9.1 10*3/uL (ref 3.8–10.8)

## 2017-05-30 LAB — PSA, TOTAL WITH REFLEX TO PSA, FREE: PSA, Total: 1.2 ng/mL (ref <=4.0)

## 2017-05-30 LAB — LIPID PANEL
Cholesterol: 199 mg/dL (ref <200)
HDL: 38 mg/dL — AB (ref >40)
LDL Cholesterol (Calc): 134 mg/dL (calc) — ABNORMAL HIGH
NON-HDL CHOLESTEROL (CALC): 161 mg/dL — AB (ref <130)
Total CHOL/HDL Ratio: 5.2 (calc) — ABNORMAL HIGH (ref <5.0)
Triglycerides: 145 mg/dL (ref <150)

## 2017-06-07 ENCOUNTER — Encounter: Payer: Self-pay | Admitting: Family Medicine

## 2017-06-07 ENCOUNTER — Other Ambulatory Visit: Payer: Self-pay | Admitting: Family Medicine

## 2017-06-07 DIAGNOSIS — Z0000 Encounter for general adult medical examination without abnormal findings: Secondary | ICD-10-CM

## 2017-06-07 DIAGNOSIS — E669 Obesity, unspecified: Secondary | ICD-10-CM

## 2017-06-07 DIAGNOSIS — Z125 Encounter for screening for malignant neoplasm of prostate: Secondary | ICD-10-CM

## 2017-06-07 DIAGNOSIS — M47812 Spondylosis without myelopathy or radiculopathy, cervical region: Secondary | ICD-10-CM

## 2017-06-07 DIAGNOSIS — E782 Mixed hyperlipidemia: Secondary | ICD-10-CM

## 2017-07-04 ENCOUNTER — Ambulatory Visit (INDEPENDENT_AMBULATORY_CARE_PROVIDER_SITE_OTHER): Payer: BLUE CROSS/BLUE SHIELD

## 2017-07-04 VITALS — Ht 69.0 in | Wt 249.5 lb

## 2017-07-04 DIAGNOSIS — Z23 Encounter for immunization: Secondary | ICD-10-CM | POA: Diagnosis not present

## 2017-07-12 DIAGNOSIS — Z86010 Personal history of colonic polyps: Secondary | ICD-10-CM | POA: Diagnosis not present

## 2017-07-17 ENCOUNTER — Other Ambulatory Visit: Payer: Self-pay | Admitting: Family Medicine

## 2017-07-17 DIAGNOSIS — E785 Hyperlipidemia, unspecified: Secondary | ICD-10-CM

## 2017-08-05 ENCOUNTER — Other Ambulatory Visit: Payer: Self-pay | Admitting: Family Medicine

## 2017-08-05 DIAGNOSIS — G8929 Other chronic pain: Secondary | ICD-10-CM

## 2017-08-05 DIAGNOSIS — M542 Cervicalgia: Principal | ICD-10-CM

## 2017-09-27 DIAGNOSIS — D126 Benign neoplasm of colon, unspecified: Secondary | ICD-10-CM | POA: Diagnosis not present

## 2017-09-27 DIAGNOSIS — Z86010 Personal history of colonic polyps: Secondary | ICD-10-CM | POA: Diagnosis not present

## 2017-09-27 DIAGNOSIS — K5730 Diverticulosis of large intestine without perforation or abscess without bleeding: Secondary | ICD-10-CM | POA: Diagnosis not present

## 2017-09-27 DIAGNOSIS — K640 First degree hemorrhoids: Secondary | ICD-10-CM | POA: Diagnosis not present

## 2017-09-27 DIAGNOSIS — K648 Other hemorrhoids: Secondary | ICD-10-CM | POA: Diagnosis not present

## 2017-09-27 DIAGNOSIS — K635 Polyp of colon: Secondary | ICD-10-CM | POA: Diagnosis not present

## 2017-09-28 LAB — HM COLONOSCOPY

## 2017-10-09 ENCOUNTER — Other Ambulatory Visit: Payer: Self-pay | Admitting: Family Medicine

## 2017-10-09 DIAGNOSIS — M542 Cervicalgia: Principal | ICD-10-CM

## 2017-10-09 DIAGNOSIS — G8929 Other chronic pain: Secondary | ICD-10-CM

## 2017-10-15 ENCOUNTER — Ambulatory Visit (INDEPENDENT_AMBULATORY_CARE_PROVIDER_SITE_OTHER): Payer: BLUE CROSS/BLUE SHIELD

## 2017-10-15 ENCOUNTER — Encounter: Payer: Self-pay | Admitting: Family Medicine

## 2017-10-15 DIAGNOSIS — Z23 Encounter for immunization: Secondary | ICD-10-CM

## 2017-11-05 ENCOUNTER — Encounter: Payer: Self-pay | Admitting: Anesthesiology

## 2017-11-05 ENCOUNTER — Other Ambulatory Visit: Payer: Self-pay

## 2017-11-05 ENCOUNTER — Ambulatory Visit: Payer: BLUE CROSS/BLUE SHIELD | Attending: Anesthesiology | Admitting: Anesthesiology

## 2017-11-05 VITALS — BP 114/81 | HR 76 | Temp 98.3°F | Resp 15 | Ht 68.0 in | Wt 224.0 lb

## 2017-11-05 DIAGNOSIS — Z7982 Long term (current) use of aspirin: Secondary | ICD-10-CM | POA: Insufficient documentation

## 2017-11-05 DIAGNOSIS — M545 Low back pain: Secondary | ICD-10-CM | POA: Diagnosis not present

## 2017-11-05 DIAGNOSIS — M542 Cervicalgia: Secondary | ICD-10-CM

## 2017-11-05 DIAGNOSIS — M5010 Cervical disc disorder with radiculopathy, unspecified cervical region: Secondary | ICD-10-CM | POA: Diagnosis not present

## 2017-11-05 DIAGNOSIS — M5412 Radiculopathy, cervical region: Secondary | ICD-10-CM | POA: Diagnosis not present

## 2017-11-05 DIAGNOSIS — M5030 Other cervical disc degeneration, unspecified cervical region: Secondary | ICD-10-CM

## 2017-11-05 DIAGNOSIS — M47812 Spondylosis without myelopathy or radiculopathy, cervical region: Secondary | ICD-10-CM | POA: Diagnosis not present

## 2017-11-05 DIAGNOSIS — Z79899 Other long term (current) drug therapy: Secondary | ICD-10-CM | POA: Diagnosis not present

## 2017-11-05 MED ORDER — ROPIVACAINE HCL 2 MG/ML IJ SOLN
INTRAMUSCULAR | Status: AC
  Filled 2017-11-05: qty 10

## 2017-11-05 MED ORDER — ROPIVACAINE HCL 2 MG/ML IJ SOLN
10.0000 mL | Freq: Once | INTRAMUSCULAR | Status: AC
  Administered 2017-11-05: 9 mL via EPIDURAL

## 2017-11-05 MED ORDER — DEXAMETHASONE SODIUM PHOSPHATE 10 MG/ML IJ SOLN
INTRAMUSCULAR | Status: AC
  Filled 2017-11-05: qty 1

## 2017-11-05 MED ORDER — DEXAMETHASONE SODIUM PHOSPHATE 10 MG/ML IJ SOLN
10.0000 mg | Freq: Once | INTRAMUSCULAR | Status: AC
  Administered 2017-11-05: 10 mg

## 2017-11-05 NOTE — Progress Notes (Signed)
Subjective:  Patient ID: Isaiah Johnson, male    DOB: [birth year]  Age: 52 y.o. MRN: [id number]  CC: Neck Pain and Back Pain (lower)   Procedure: Bilateral trapezius muscle trigger point injection x2  HPI Isaiah Johnson presents for reevaluation.  He was last seen 2017 and was initially treated for cervicalgia and some bilateral neck pain.  He presents today for reevaluation stating that he is had recurrence of the same pain that brought him to [place] originally.  He primarily reports muscle spasm aching gnawing pain that starts in the posterior neck and radiates into the backs of his shoulders and occasionally down his arms.  He also reports some numbness and tingling in the ring and little finger on the right hand and occasionally some intermittent weakness when flexing the right wrist.  This was his primary pain in the past however recently has had some pain on the left side with radiation of the pain into the left lateral shoulder and left posterior arm.  He has gone through some massage therapy and this sometimes gives him relief and medication management gives him some mild relief however he had trigger point injections in the distant past in 2017 that essentially alleviated the vast majority of his pain for a year or so.  He presents today requesting the same.  No new change in upper extremity strength beyond what was mentioned is noted.  The quality of the pain has been stable from his 2017 initial presentation.  He does not report problems with dropping items or lower extremity strength.  Outpatient Medications Prior to Visit  Medication Sig Dispense Refill  . aspirin EC 81 MG tablet Take 81 mg by mouth daily.    Marland Kitchen b complex vitamins capsule Take 1 capsule by mouth daily.    . baclofen (LIORESAL) 10 MG tablet TAKE 0.5-1 TABLETS (5-10 MG TOTAL) BY MOUTH 3 (THREE) TIMES DAILY AS NEEDED FOR MUSCLE SPASMS 60 tablet 2  . fenofibrate micronized (LOFIBRA) 67 MG capsule Take 1 capsule (67 mg total) by  mouth daily before breakfast. 90 capsule 3  . fluocinonide cream (LIDEX) 0.05 % Apply 1 application topically 2 (two) times daily as needed.    . meloxicam (MOBIC) 15 MG tablet Take 1 tablet (15 mg total) by mouth daily as needed for pain. Take 1-2 weeks at a time, may hold doses if not flare up 90 tablet 1  . Multiple Vitamin (MULTIVITAMIN WITH MINERALS) TABS tablet Take 1 tablet by mouth daily.    . niacin 500 MG tablet Take 500 mg by mouth at bedtime.    . Omega-3 Fatty Acids (FISH OIL) 1000 MG CAPS Take 1,000 mg by mouth daily.    . pravastatin (PRAVACHOL) 40 MG tablet Take 1 tablet (40 mg total) by mouth daily. 90 tablet 3  . Probiotic Product (PROBIOTIC DAILY PO) Take by mouth daily.    Marland Kitchen triamcinolone cream (KENALOG) 0.1 % Apply 1 application topically daily as needed.    . vitamin E (VITAMIN E) 1000 UNIT capsule Take 1,000 Units by mouth daily.     No facility-administered medications prior to visit.     Review of Systems CNS: No confusion or sedation Cardiac: No angina or palpitations GI: No abdominal pain or constipation Constitutional: No nausea vomiting fevers or chills  Objective:  BP 121/78   Pulse 85   Temp 98.3 F (36.8 C) (Oral)   Resp 16   Ht 5\' 8"  (1.727 m)   Wt 224 lb (101.6  kg)   SpO2 96%   BMI 34.06 kg/m    BP Readings from Last 3 Encounters:  11/05/17 121/78  05/29/17 125/74  02/27/16 126/83     Wt Readings from Last 3 Encounters:  11/05/17 224 lb (101.6 kg)  07/04/17 249 lb 8 oz (113.2 kg)  05/29/17 249 lb 9.6 oz (113.2 kg)     Physical Exam Pt is alert and oriented PERRL EOMI HEART IS RRR no murmur or rub LCTA no wheezing or rales MUSCULOSKELETAL reveals some tenderness in the bilateral trapezius muscles with 2 trigger points noted.  His strength in the upper extremities appears to be well preserved but he does have a slightly diminished hand grip strength on the right versus left rated approximately 5-/5.  He also is slightly weaker to  dorsiflexion on the right side right wrist as compared to the left at 5-/5.  His muscle tone and bulk is good with no evidence of any sensory deficits.  Labs  Lab Results  Component Value Date   HGBA1C 5.4 05/29/2017   Lab Results  Component Value Date   LDLCALC 134 (H) 05/29/2017   CREATININE 1.01 05/29/2017    -------------------------------------------------------------------------------------------------------------------- Lab Results  Component Value Date   WBC 9.1 05/29/2017   HGB 16.3 05/29/2017   HCT 47.5 05/29/2017   PLT 303 05/29/2017   GLUCOSE 88 05/29/2017   CHOL 199 05/29/2017   TRIG 145 05/29/2017   HDL 38 (L) 05/29/2017   LDLCALC 134 (H) 05/29/2017   ALT 25 05/29/2017   AST 20 05/29/2017   NA 139 05/29/2017   K 4.6 05/29/2017   CL 104 05/29/2017   CREATININE 1.01 05/29/2017   BUN 20 05/29/2017   CO2 26 05/29/2017   HGBA1C 5.4 05/29/2017    --------------------------------------------------------------------------------------------------------------------- Patient was never admitted.   Assessment & Plan:   Reuel BoomDaniel was seen today for neck pain and back pain.  Diagnoses and all orders for this visit:  Cervicalgia -     INJECT TRIGGER POINT, 1 OR 2 -     Ambulatory referral to Physical Therapy  Cervical radiculitis -     Ambulatory referral to Physical Therapy  DDD (degenerative disc disease), cervical  Cervical facet syndrome  Other orders -     dexamethasone (DECADRON) injection 10 mg -     ropivacaine (PF) 2 mg/mL (0.2%) (NAROPIN) injection 10 mL        ----------------------------------------------------------------------------------------------------------------------  Problem List Items Addressed This Visit    None    Visit Diagnoses    Cervicalgia    -  Primary   Relevant Orders   INJECT TRIGGER POINT, 1 OR 2   Ambulatory referral to Physical Therapy   Cervical radiculitis       Relevant Orders   Ambulatory referral to  Physical Therapy   DDD (degenerative disc disease), cervical       Relevant Medications   dexamethasone (DECADRON) injection 10 mg (Start on 11/05/2017  4:30 PM)   Cervical facet syndrome       Relevant Medications   dexamethasone (DECADRON) injection 10 mg (Start on 11/05/2017  4:30 PM)        ----------------------------------------------------------------------------------------------------------------------  1. Cervicalgia We will proceed with trigger point injections as requested by the patient today.  The risks and benefits of the procedure been reviewed with him in full detail and all questions answered.  I also given him some exercises to initiate for physical therapy.  We will refer him to physical therapy for TENS unit  application and deep tissue massage.  We will schedule him for return to clinic in 3 weeks for possible repeat trigger point injections. - INJECT TRIGGER POINT, 1 OR 2 - Ambulatory referral to Physical Therapy  2. Cervical radiculitis We have had discussions regarding the intermittent weakness he is experiencing and should this persist it would warrant a cervical MRI with evaluation with a neurosurgeon and this is been recommended to him should this occur. - Ambulatory referral to Physical Therapy  3. DDD (degenerative disc disease), cervical As above  4. Cervical facet syndrome     ----------------------------------------------------------------------------------------------------------------------  I am having [name] maintain his multivitamin with minerals, vitamin E, aspirin EC, niacin, Fish Oil, fluocinonide cream, triamcinolone cream, b complex vitamins, meloxicam, fenofibrate micronized, pravastatin, baclofen, and Probiotic Product (PROBIOTIC DAILY PO).   Meds ordered this encounter  Medications  . dexamethasone (DECADRON) injection 10 mg  . ropivacaine (PF) 2 mg/mL (0.2%) (NAROPIN) injection 10 mL   Patient's Medications  New  Prescriptions   No medications on file  Previous Medications   ASPIRIN EC 81 MG TABLET    Take 81 mg by mouth daily.   B COMPLEX VITAMINS CAPSULE    Take 1 capsule by mouth daily.   BACLOFEN (LIORESAL) 10 MG TABLET    TAKE 0.5-1 TABLETS (5-10 MG TOTAL) BY MOUTH 3 (THREE) TIMES DAILY AS NEEDED FOR MUSCLE SPASMS   FENOFIBRATE MICRONIZED (LOFIBRA) 67 MG CAPSULE    Take 1 capsule (67 mg total) by mouth daily before breakfast.   FLUOCINONIDE CREAM (LIDEX) 0.05 %    Apply 1 application topically 2 (two) times daily as needed.   MELOXICAM (MOBIC) 15 MG TABLET    Take 1 tablet (15 mg total) by mouth daily as needed for pain. Take 1-2 weeks at a time, may hold doses if not flare up   MULTIPLE VITAMIN (MULTIVITAMIN WITH MINERALS) TABS TABLET    Take 1 tablet by mouth daily.   NIACIN 500 MG TABLET    Take 500 mg by mouth at bedtime.   OMEGA-3 FATTY ACIDS (FISH OIL) 1000 MG CAPS    Take 1,000 mg by mouth daily.   PRAVASTATIN (PRAVACHOL) 40 MG TABLET    Take 1 tablet (40 mg total) by mouth daily.   PROBIOTIC PRODUCT (PROBIOTIC DAILY PO)    Take by mouth daily.   TRIAMCINOLONE CREAM (KENALOG) 0.1 %    Apply 1 application topically daily as needed.   VITAMIN E (VITAMIN E) 1000 UNIT CAPSULE    Take 1,000 Units by mouth daily.  Modified Medications   No medications on file  Discontinued Medications   No medications on file   ----------------------------------------------------------------------------------------------------------------------  Follow-up: Return in about 3 weeks (around 11/26/2017) for evaluation, procedure.  Trigger point injection: The area overlying the aforementioned trigger points were prepped with alcohol. They were then injected with a 25-gauge needle with 4 cc of ropivacaine 0.2% and Decadron 4 mg at each site after negative aspiration for heme. This was performed after informed consent was obtained and risks and benefits reviewed. She tolerated this procedure without difficulty and was  convalesced and discharged to home in stable condition for follow-up as mentioned.  @Johanan Skorupski  Pernell Dupre, MD@  Yevette Edwards, MD

## 2017-11-05 NOTE — Patient Instructions (Signed)
Trigger Point Injection Trigger points are areas where you have pain. A trigger point injection is a shot given in the trigger point to help relieve pain for a few days to a few months. Common places for trigger points include:  The neck.  The shoulders.  The upper back.  The lower back.  A trigger point injection will not cure long-lasting (chronic) pain permanently. These injections do not always work for every person, but for some people they can help to relieve pain for a few days to a few months. Tell a health care provider about:  Any allergies you have.  All medicines you are taking, including vitamins, herbs, eye drops, creams, and over-the-counter medicines.  Any problems you or family members have had with anesthetic medicines.  Any blood disorders you have.  Any surgeries you have had.  Any medical conditions you have. What are the risks? Generally, this is a safe procedure. However, problems may occur, including:  Infection.  Bleeding.  Allergic reaction to the injected medicine.  Irritation of the skin around the injection site.  What happens before the procedure?  Ask your health care provider about changing or stopping your regular medicines. This is especially important if you are taking diabetes medicines or blood thinners. What happens during the procedure?  Your health care provider will feel for trigger points. A marker may be used to circle the area for the injection.  The skin over the trigger point will be washed with a germ-killing (antiseptic) solution.  A thin needle is used for the shot. You may feel pain or a twitching feeling when the needle enters the trigger point.  A numbing solution may be injected into the trigger point. Sometimes a medicine to keep down swelling, redness, and warmth (inflammation) is also injected.  Your health care provider may move the needle around the area where the trigger point is located until the tightness  and twitching goes away.  After the injection, your health care provider may put gentle pressure over the injection site.  The injection site will be covered with a bandage (dressing). The procedure may vary among health care providers and hospitals. What happens after the procedure?  The dressing can be taken off in a few hours or as told by your health care provider.  You may feel sore and stiff for 1-2 days. This information is not intended to replace advice given to you by your health care provider. Make sure you discuss any questions you have with your health care provider. Document Released: 03/15/2011 Document Revised: 11/27/2015 Document Reviewed: 09/13/2014 Elsevier Interactive Patient Education  2018 [employer]. Trigger Point Injection Trigger points are areas where you have pain. A trigger point injection is a shot given in the trigger point to help relieve pain for a few days to a few months. Common places for trigger points include:  The neck.  The shoulders.  The upper back.  The lower back.  A trigger point injection will not cure long-lasting (chronic) pain permanently. These injections do not always work for every person, but for some people they can help to relieve pain for a few days to a few months. Tell a health care provider about:  Any allergies you have.  All medicines you are taking, including vitamins, herbs, eye drops, creams, and over-the-counter medicines.  Any problems you or family members have had with anesthetic medicines.  Any blood disorders you have.  Any surgeries you have had.  Any medical conditions you  have. What are the risks? Generally, this is a safe procedure. However, problems may occur, including:  Infection.  Bleeding.  Allergic reaction to the injected medicine.  Irritation of the skin around the injection site.  What happens before the procedure?  Ask your health care provider about changing or stopping your  regular medicines. This is especially important if you are taking diabetes medicines or blood thinners. What happens during the procedure?  Your health care provider will feel for trigger points. A marker may be used to circle the area for the injection.  The skin over the trigger point will be washed with a germ-killing (antiseptic) solution.  A thin needle is used for the shot. You may feel pain or a twitching feeling when the needle enters the trigger point.  A numbing solution may be injected into the trigger point. Sometimes a medicine to keep down swelling, redness, and warmth (inflammation) is also injected.  Your health care provider may move the needle around the area where the trigger point is located until the tightness and twitching goes away.  After the injection, your health care provider may put gentle pressure over the injection site.  The injection site will be covered with a bandage (dressing). The procedure may vary among health care providers and hospitals. What happens after the procedure?  The dressing can be taken off in a few hours or as told by your health care provider.  You may feel sore and stiff for 1-2 days. This information is not intended to replace advice given to you by your health care provider. Make sure you discuss any questions you have with your health care provider. Document Released: 03/15/2011 Document Revised: 11/27/2015 Document Reviewed: 09/13/2014 Elsevier Interactive Patient Education  2018 [employer]

## 2017-11-08 ENCOUNTER — Encounter: Payer: Self-pay | Admitting: Family Medicine

## 2017-11-14 ENCOUNTER — Encounter: Payer: Self-pay | Admitting: Family Medicine

## 2017-11-23 ENCOUNTER — Other Ambulatory Visit: Payer: Self-pay | Admitting: Family Medicine

## 2017-11-23 DIAGNOSIS — G8929 Other chronic pain: Secondary | ICD-10-CM

## 2017-11-23 DIAGNOSIS — M542 Cervicalgia: Principal | ICD-10-CM

## 2017-11-26 ENCOUNTER — Encounter: Payer: Self-pay | Admitting: Anesthesiology

## 2017-11-26 ENCOUNTER — Ambulatory Visit: Payer: BLUE CROSS/BLUE SHIELD | Attending: Anesthesiology | Admitting: Anesthesiology

## 2017-11-26 ENCOUNTER — Other Ambulatory Visit: Payer: Self-pay

## 2017-11-26 VITALS — BP 120/94 | HR 78 | Temp 98.4°F | Resp 16 | Ht 69.0 in | Wt 218.0 lb

## 2017-11-26 DIAGNOSIS — Z79899 Other long term (current) drug therapy: Secondary | ICD-10-CM | POA: Diagnosis not present

## 2017-11-26 DIAGNOSIS — M5010 Cervical disc disorder with radiculopathy, unspecified cervical region: Secondary | ICD-10-CM | POA: Insufficient documentation

## 2017-11-26 DIAGNOSIS — M47812 Spondylosis without myelopathy or radiculopathy, cervical region: Secondary | ICD-10-CM | POA: Diagnosis not present

## 2017-11-26 DIAGNOSIS — M5030 Other cervical disc degeneration, unspecified cervical region: Secondary | ICD-10-CM | POA: Diagnosis not present

## 2017-11-26 DIAGNOSIS — M5412 Radiculopathy, cervical region: Secondary | ICD-10-CM | POA: Diagnosis not present

## 2017-11-26 DIAGNOSIS — M542 Cervicalgia: Secondary | ICD-10-CM

## 2017-11-26 DIAGNOSIS — R531 Weakness: Secondary | ICD-10-CM | POA: Diagnosis not present

## 2017-11-26 DIAGNOSIS — M25512 Pain in left shoulder: Secondary | ICD-10-CM | POA: Insufficient documentation

## 2017-11-26 DIAGNOSIS — Z7982 Long term (current) use of aspirin: Secondary | ICD-10-CM | POA: Diagnosis not present

## 2017-11-26 DIAGNOSIS — Z791 Long term (current) use of non-steroidal anti-inflammatories (NSAID): Secondary | ICD-10-CM | POA: Diagnosis not present

## 2017-11-26 MED ORDER — DEXAMETHASONE SODIUM PHOSPHATE 10 MG/ML IJ SOLN
INTRAMUSCULAR | Status: AC
  Filled 2017-11-26: qty 1

## 2017-11-26 MED ORDER — ROPIVACAINE HCL 2 MG/ML IJ SOLN
INTRAMUSCULAR | Status: AC
  Filled 2017-11-26: qty 10

## 2017-11-26 MED ORDER — DEXAMETHASONE SODIUM PHOSPHATE 10 MG/ML IJ SOLN: 10.0000 mg | Freq: Once | INTRAMUSCULAR | Status: DC

## 2017-11-26 MED ORDER — ROPIVACAINE HCL 2 MG/ML IJ SOLN: 10.0000 mL | Freq: Once | INTRAMUSCULAR | Status: DC

## 2017-11-26 MED ORDER — TRAMADOL HCL 50 MG PO TABS: 50.0000 mg | ORAL_TABLET | Freq: Two times a day (BID) | ORAL | 1 refills | Status: AC

## 2017-11-26 MED ORDER — DEXAMETHASONE SODIUM PHOSPHATE 10 MG/ML IJ SOLN
10.0000 mg | Freq: Once | INTRAMUSCULAR | Status: AC
  Administered 2017-11-26: 10 mg

## 2017-11-26 NOTE — Progress Notes (Signed)
Safety precautions to be maintained throughout the outpatient stay will include: orient to surroundings, keep bed in low position, maintain call bell within reach at all times, provide assistance with transfer out of bed and ambulation.  

## 2017-11-26 NOTE — Patient Instructions (Addendum)
You have been given Tramadol script today. Post-procedure Information What to expect: Most procedures involve the use of a local anesthetic (numbing medicine), and a steroid (anti-inflammatory medicine).  The local anesthetics may cause temporary numbness and weakness of the legs or arms, depending on the location of the block. This numbness/weakness may last 4-6 hours, depending on the local anesthetic used. In rare instances, it can last up to 24 hours. While numb, you must be very careful not to injure the extremity.  After any procedure, you could expect the pain to get better within 15-20 minutes. This relief is temporary and may last 4-6 hours. Once the local anesthetics wears off, you could experience discomfort, possibly more than usual, for up to 10 (ten) days. In the case of radiofrequencies, it may last up to 6 weeks. Surgeries may take up to 8 weeks for the healing process. The discomfort is due to the irritation caused by needles going through skin and muscle. To minimize the discomfort, we recommend using ice the first day, and heat from then on. The ice should be applied for 15 minutes on, and 15 minutes off. Keep repeating this cycle until bedtime. Avoid applying the ice directly to the skin, to prevent frostbite. Heat should be used daily, until the pain improves (4-10 days). Be careful not to burn yourself.  Occasionally you may experience muscle spasms or cramps. These occur as a consequence of the irritation caused by the needle sticks to the muscle and the blood that will inevitably be lost into the surrounding muscle tissue. Blood tends to be very irritating to tissues, which tend to react by going into spasm. These spasms may start the same day of your procedure, but they may also take days to develop. This late onset type of spasm or cramp is usually caused by electrolyte imbalances triggered by the steroids, at the level of the kidney. Cramps and spasms tend to respond well to  muscle relaxants, multivitamins (some are triggered by the procedure, but may have their origins in vitamin deficiencies), and "Gatorade", or any sports drinks that can replenish any electrolyte imbalances. (If you are a diabetic, ask your pharmacist to get you a sugar-free brand.) Warm showers or baths may also be helpful. Stretching exercises are highly recommended. General Instructions:  Be alert for signs of possible infection: redness, swelling, heat, red streaks, elevated temperature, and/or fever. These typically appear 4 to 6 days after the procedure. Immediately notify your doctor if you experience unusual bleeding, difficulty breathing, or loss of bowel or bladder control. If you experience increased pain, do not increase your pain medicine intake, unless instructed by your pain physician. Post-Procedure Care:  Be careful in moving about. Muscle spasms in the area of the injection may occur. Applying ice or heat to the area is often helpful. The incidence of spinal headaches after epidural injections ranges between 1.4% and 6%. If you develop a headache that does not seem to respond to conservative therapy, please let your physician know. This can be treated with an epidural blood patch.   Post-procedure numbness or redness is to be expected, however it should average 4 to 6 hours. If numbness and weakness of your extremities begins to develop 4 to 6 hours after your procedure, and is felt to be progressing and worsening, immediately contact your physician.   Diet:  If you experience nausea, do not eat until this sensation goes away. If you had a "Stellate Ganglion Block" for upper extremity "Reflex Sympathetic  Dystrophy", do not eat or drink until your hoarseness goes away. In any case, always start with liquids first and if you tolerate them well, then slowly progress to more solid foods. Activity:  For the first 4 to 6 hours after the procedure, use caution in moving about as you may  experience numbness and/or weakness. Use caution in cooking, using household electrical appliances, and climbing steps. If you need to reach your Doctor call our office: [phone] Monday-Thursday 8:00 am - 4:00 PM    Fridays: Closed     In case of an emergency: In case of emergency, call 911 or go to the nearest emergency room and have the physician there call us.  Interpretation of Procedure Every nerve block has two components: a diagnostic component, and a treatment component. Unrealistic expectations are the most common causes of "perceived failure".  In a perfect world, a single nerve block should be able to completely and permanently eliminate the pain. Sadly, the world is not perfect.  Most pain management nerve blocks are performed using local anesthetics and steroids. Steroids are responsible for any long-term benefit that you may experience. Their purpose is to decrease any chronic swelling that may exist in the area. Steroids begin to work immediately after being injected. However, most patients will not experience any benefits until 5 to 10 days after the injection, when the swelling has come down to the point where they can tell a difference. Steroids will only help if there is swelling to be treated. As such, they can assist with the diagnosis. If effective, they suggest an inflammatory component to the pain, and if ineffective, they rule out inflammation as the main cause or component of the problem. If the problem is one of mechanical compression, you will get no benefit from those steroids.   In the case of local anesthetics, they have a crucial role in the diagnosis of your condition. Most will begin to work within15 to 20 minutes after injection. The duration will depend on the type used (short- vs. Long-acting). It is of outmost importance that patients keep tract of their pain, after the procedure. To assist with this matter, a "Post-procedure Pain Diary" is provided. Make sure  to complete it and to bring it back to your follow-up appointment.  As long as the patient keeps accurate, detailed records of their symptoms after every procedure, and returns to have those interpreted, every procedure will provide [place] with invaluable information. Even a block that does not provide the patient with any relief, will always provide [place] with information about the mechanism and the origin of the pain. The only time a nerve block can be considered a waste of time is when patients do not keep track of the results, or do not keep their post-procedure appointment.  Reporting the results back to your physician The Pain Score  Pain is a subjective complaint. It cannot be seen, touched, or measured. We depend entirely on the patient's report of the pain in order to assess your condition and treatment. To evaluate the pain, we use a pain scale, where "0" means "No Pain", and a "10" is "the worst possible pain that you can even imagine" (i.e. something like been eaten alive by a shark or being torn apart by a lion).   You will frequently be asked to rate your pain. Please be as accurate, remember that medical decisions will be based on your responses. Please do not rate your pain above a 10. Doing so  is actually interpreted as "symptom magnification" (exaggeration), as well as lack of understanding with regards to the scale. To put this into perspective, when you tell us that your pain is at a 10 (ten), what you are saying is that there is nothing we can do to make this pain any worse. (Carefully think about that.)

## 2017-11-28 NOTE — Progress Notes (Signed)
Subjective:  Patient ID: Isaiah Johnson, male    DOB: March 19, 1966  Age: 52 y.o. MRN: [id number]  CC: Neck Pain   Procedure: Left trigger point injection x3 to the left mid body trapezius  HPI Isaiah Johnson presents for reevaluation.  He was last seen in July and had trigger point injections bilaterally at that point.  He is still having some persistent left side greater than right side neck pain and shoulder pain with some radiation in the left posterior triceps region of the arm.  He has noticed occasional weakness affecting the left arm in that region as well.  Types of extension motion are limited and the pain has been persistent.  The right side posterior neck is somewhat improved he states.  He has not noticed any change in muscle tone or bulk or sensory deficits otherwise.  The pain is worse after certain activities and he has been taking Mobic and baclofen for pain relief and muscle relaxation.  These are reportedly insufficient to keep his pain under control at this point.  In the past he has had cervical epidural steroid injections which have been beneficial for a similar type discomfort.   Outpatient Medications Prior to Visit  Medication Sig Dispense Refill  . acetaminophen (TYLENOL) 500 MG tablet Take 500 mg by mouth every 6 (six) hours as needed.    Marland Kitchen aspirin EC 81 MG tablet Take 81 mg by mouth daily.    Marland Kitchen b complex vitamins capsule Take 1 capsule by mouth daily.    . baclofen (LIORESAL) 10 MG tablet TAKE 0.5-1 TABLETS (5-10 MG TOTAL) BY MOUTH 3 (THREE) TIMES DAILY AS NEEDED FOR MUSCLE SPASMS 60 tablet 2  . fenofibrate micronized (LOFIBRA) 67 MG capsule Take 1 capsule (67 mg total) by mouth daily before breakfast. 90 capsule 3  . fluocinonide cream (LIDEX) 0.05 % Apply 1 application topically 2 (two) times daily as needed.    . meloxicam (MOBIC) 15 MG tablet TAKE 1TAB BY MOUTH DAILY AS NEEDED FOR PAIN.TAKE 1-2 WEEKS AT A TIME, MAY HOLD DOSES IF NOT FLARE UP 90 tablet 1  . Multiple  Vitamin (MULTIVITAMIN WITH MINERALS) TABS tablet Take 1 tablet by mouth daily.    . naproxen sodium (ALEVE) 220 MG tablet Take 220 mg by mouth.    . niacin 500 MG tablet Take 500 mg by mouth at bedtime.    . Omega-3 Fatty Acids (FISH OIL) 1000 MG CAPS Take 1,000 mg by mouth daily.    . pravastatin (PRAVACHOL) 40 MG tablet Take 1 tablet (40 mg total) by mouth daily. 90 tablet 3  . Probiotic Product (PROBIOTIC DAILY PO) Take by mouth daily.    Marland Kitchen triamcinolone cream (KENALOG) 0.1 % Apply 1 application topically daily as needed.    . vitamin E (VITAMIN E) 1000 UNIT capsule Take 1,000 Units by mouth daily.     No facility-administered medications prior to visit.     Review of Systems CNS: No confusion or sedation Cardiac: No angina or palpitations GI: No abdominal pain or constipation Constitutional: No nausea vomiting fevers or chills  Objective:  BP (!) 120/94   Pulse 78   Temp 98.4 F (36.9 C) (Oral)   Resp 16   Ht 5\' 9"  (1.753 m)   Wt 218 lb (98.9 kg)   SpO2 97%   BMI 32.19 kg/m    BP Readings from Last 3 Encounters:  11/26/17 (!) 120/94  11/05/17 114/81  05/29/17 125/74     Wt Readings  from Last 3 Encounters:  11/26/17 218 lb (98.9 kg)  11/05/17 224 lb (101.6 kg)  07/04/17 249 lb 8 oz (113.2 kg)     Physical Exam Pt is alert and oriented PERRL EOMI HEART IS RRR no murmur or rub LCTA no wheezing or rales MUSCULOSKELETAL reveals 3 trigger points in the mid body trapezius muscle.  He has a reduced range of motion at the atlantooccipital joint to flexion extension and rotation.  Some mild pain with this motion.  He does report getting pain with the percussion of the trigger points into the left facet region.  The right trapezius shows less sensitivity.  His muscle tone and bulk is good  Labs  Lab Results  Component Value Date   HGBA1C 5.4 05/29/2017   Lab Results  Component Value Date   LDLCALC 134 (H) 05/29/2017   CREATININE 1.01 05/29/2017     -------------------------------------------------------------------------------------------------------------------- Lab Results  Component Value Date   WBC 9.1 05/29/2017   HGB 16.3 05/29/2017   HCT 47.5 05/29/2017   PLT 303 05/29/2017   GLUCOSE 88 05/29/2017   CHOL 199 05/29/2017   TRIG 145 05/29/2017   HDL 38 (L) 05/29/2017   LDLCALC 134 (H) 05/29/2017   ALT 25 05/29/2017   AST 20 05/29/2017   NA 139 05/29/2017   K 4.6 05/29/2017   CL 104 05/29/2017   CREATININE 1.01 05/29/2017   BUN 20 05/29/2017   CO2 26 05/29/2017   HGBA1C 5.4 05/29/2017    --------------------------------------------------------------------------------------------------------------------- Patient was never admitted.   Assessment & Plan:   Isaiah BoomDaniel was seen today for neck pain.  Diagnoses and all orders for this visit:  Cervicalgia -     Ambulatory referral to Physical Therapy -     MR CERVICAL SPINE W WO CONTRAST; Future  Cervical radiculitis -     Ambulatory referral to Physical Therapy -     MR CERVICAL SPINE W WO CONTRAST; Future  DDD (degenerative disc disease), cervical -     Ambulatory referral to Physical Therapy -     MR CERVICAL SPINE W WO CONTRAST; Future  Cervical facet syndrome  Other orders -     dexamethasone (DECADRON) injection 10 mg -     ropivacaine (PF) 2 mg/mL (0.2%) (NAROPIN) injection 10 mL -     traMADol (ULTRAM) 50 MG tablet; Take 1 tablet (50 mg total) by mouth 2 (two) times daily. -     dexamethasone (DECADRON) injection 10 mg -     ropivacaine (PF) 2 mg/mL (0.2%) (NAROPIN) injection 10 mL        ----------------------------------------------------------------------------------------------------------------------  Problem List Items Addressed This Visit    None    Visit Diagnoses    Cervicalgia    -  Primary   Relevant Orders   Ambulatory referral to Physical Therapy   MR CERVICAL SPINE W WO CONTRAST   Cervical radiculitis       Relevant Orders    Ambulatory referral to Physical Therapy   MR CERVICAL SPINE W WO CONTRAST   DDD (degenerative disc disease), cervical       Relevant Medications   acetaminophen (TYLENOL) 500 MG tablet   naproxen sodium (ALEVE) 220 MG tablet   dexamethasone (DECADRON) injection 10 mg (Completed)   traMADol (ULTRAM) 50 MG tablet   dexamethasone (DECADRON) injection 10 mg   Other Relevant Orders   Ambulatory referral to Physical Therapy   MR CERVICAL SPINE W WO CONTRAST   Cervical facet syndrome  Relevant Medications   acetaminophen (TYLENOL) 500 MG tablet   naproxen sodium (ALEVE) 220 MG tablet   dexamethasone (DECADRON) injection 10 mg (Completed)   traMADol (ULTRAM) 50 MG tablet   dexamethasone (DECADRON) injection 10 mg        ----------------------------------------------------------------------------------------------------------------------  1. Cervicalgia We will proceed with repeat trigger point injections today.  I am gone over some stretching strengthening exercises for him as well. - Ambulatory referral to Physical Therapy - MR CERVICAL SPINE W WO CONTRAST; Future  2. Cervical radiculitis Secondary to the weakness in the left tricep extension region I am going to refer him for a cervical MRI which I feel will also be beneficial prior to proceeding with cervical epidural steroid injections which we have discussed today as well.  We have gone over the risks and benefits of both the trigger point injection and cervical epidurals.  In the meantime for pain relief I am going to start him on Ultram and refer him to physical therapy for possible traction and TENS application. - Ambulatory referral to Physical Therapy - MR CERVICAL SPINE W WO CONTRAST; Future  3. DDD (degenerative disc disease), cervical As above - Ambulatory referral to Physical Therapy - MR CERVICAL SPINE W WO CONTRAST; Future  4. Cervical facet syndrome As  above    ----------------------------------------------------------------------------------------------------------------------  I am having [name] start on traMADol. I am also having him maintain his multivitamin with minerals, vitamin E, aspirin EC, niacin, Fish Oil, fluocinonide cream, triamcinolone cream, b complex vitamins, fenofibrate micronized, pravastatin, baclofen, Probiotic Product (PROBIOTIC DAILY PO), meloxicam, acetaminophen, and naproxen sodium. We administered dexamethasone.   Meds ordered this encounter  Medications  . dexamethasone (DECADRON) injection 10 mg  . ropivacaine (PF) 2 mg/mL (0.2%) (NAROPIN) injection 10 mL  . traMADol (ULTRAM) 50 MG tablet    Sig: Take 1 tablet (50 mg total) by mouth 2 (two) times daily.    Dispense:  60 tablet    Refill:  1  . dexamethasone (DECADRON) injection 10 mg  . ropivacaine (PF) 2 mg/mL (0.2%) (NAROPIN) injection 10 mL   Patient's Medications  New Prescriptions   TRAMADOL (ULTRAM) 50 MG TABLET    Take 1 tablet (50 mg total) by mouth 2 (two) times daily.  Previous Medications   ACETAMINOPHEN (TYLENOL) 500 MG TABLET    Take 500 mg by mouth every 6 (six) hours as needed.   ASPIRIN EC 81 MG TABLET    Take 81 mg by mouth daily.   B COMPLEX VITAMINS CAPSULE    Take 1 capsule by mouth daily.   BACLOFEN (LIORESAL) 10 MG TABLET    TAKE 0.5-1 TABLETS (5-10 MG TOTAL) BY MOUTH 3 (THREE) TIMES DAILY AS NEEDED FOR MUSCLE SPASMS   FENOFIBRATE MICRONIZED (LOFIBRA) 67 MG CAPSULE    Take 1 capsule (67 mg total) by mouth daily before breakfast.   FLUOCINONIDE CREAM (LIDEX) 0.05 %    Apply 1 application topically 2 (two) times daily as needed.   MELOXICAM (MOBIC) 15 MG TABLET    TAKE 1TAB BY MOUTH DAILY AS NEEDED FOR PAIN.TAKE 1-2 WEEKS AT A TIME, MAY HOLD DOSES IF NOT FLARE UP   MULTIPLE VITAMIN (MULTIVITAMIN WITH MINERALS) TABS TABLET    Take 1 tablet by mouth daily.   NAPROXEN SODIUM (ALEVE) 220 MG TABLET    Take 220 mg by mouth.    NIACIN 500 MG TABLET    Take 500 mg by mouth at bedtime.   OMEGA-3 FATTY ACIDS (FISH OIL) 1000 MG  CAPS    Take 1,000 mg by mouth daily.   PRAVASTATIN (PRAVACHOL) 40 MG TABLET    Take 1 tablet (40 mg total) by mouth daily.   PROBIOTIC PRODUCT (PROBIOTIC DAILY PO)    Take by mouth daily.   TRIAMCINOLONE CREAM (KENALOG) 0.1 %    Apply 1 application topically daily as needed.   VITAMIN E (VITAMIN E) 1000 UNIT CAPSULE    Take 1,000 Units by mouth daily.  Modified Medications   No medications on file  Discontinued Medications   No medications on file   ----------------------------------------------------------------------------------------------------------------------  Follow-up: Return in about 2 weeks (around 12/10/2017) for procedure.  Trigger point injection: The area overlying the aforementioned trigger points were prepped with alcohol. They were then injected with a 25-gauge needle with 4 cc of ropivacaine 0.2% and Decadron 4 mg at each site after negative aspiration for heme. This was performed after informed consent was obtained and risks and benefits reviewed. She tolerated this procedure without difficulty and was convalesced and discharged to home in stable condition for follow-up as mentioned.  @Hebah Bogosian  Pernell Dupre, MD@  Yevette Edwards, MD

## 2017-11-29 ENCOUNTER — Encounter: Payer: Self-pay | Admitting: Anesthesiology

## 2017-11-29 ENCOUNTER — Encounter: Payer: Self-pay | Admitting: Family Medicine

## 2017-12-10 DIAGNOSIS — M542 Cervicalgia: Secondary | ICD-10-CM | POA: Diagnosis not present

## 2017-12-13 ENCOUNTER — Other Ambulatory Visit: Payer: Self-pay | Admitting: Family Medicine

## 2017-12-13 DIAGNOSIS — M542 Cervicalgia: Principal | ICD-10-CM

## 2017-12-13 DIAGNOSIS — G8929 Other chronic pain: Secondary | ICD-10-CM

## 2017-12-24 DIAGNOSIS — M542 Cervicalgia: Secondary | ICD-10-CM | POA: Diagnosis not present

## 2017-12-27 DIAGNOSIS — M542 Cervicalgia: Secondary | ICD-10-CM | POA: Diagnosis not present

## 2017-12-31 ENCOUNTER — Encounter: Payer: Self-pay | Admitting: Anesthesiology

## 2017-12-31 ENCOUNTER — Other Ambulatory Visit: Payer: Self-pay

## 2017-12-31 ENCOUNTER — Ambulatory Visit: Payer: BLUE CROSS/BLUE SHIELD | Attending: Anesthesiology | Admitting: Anesthesiology

## 2017-12-31 VITALS — BP 108/73 | HR 77 | Temp 98.4°F | Resp 16 | Ht 69.0 in | Wt 220.0 lb

## 2017-12-31 DIAGNOSIS — M5412 Radiculopathy, cervical region: Secondary | ICD-10-CM | POA: Diagnosis not present

## 2017-12-31 DIAGNOSIS — M5010 Cervical disc disorder with radiculopathy, unspecified cervical region: Secondary | ICD-10-CM | POA: Insufficient documentation

## 2017-12-31 DIAGNOSIS — M542 Cervicalgia: Secondary | ICD-10-CM

## 2017-12-31 DIAGNOSIS — M47812 Spondylosis without myelopathy or radiculopathy, cervical region: Secondary | ICD-10-CM

## 2017-12-31 DIAGNOSIS — M5030 Other cervical disc degeneration, unspecified cervical region: Secondary | ICD-10-CM

## 2017-12-31 MED ORDER — DEXAMETHASONE SODIUM PHOSPHATE 10 MG/ML IJ SOLN
10.0000 mg | Freq: Once | INTRAMUSCULAR | Status: AC
  Administered 2017-12-31: 10 mg

## 2017-12-31 MED ORDER — ROPIVACAINE HCL 2 MG/ML IJ SOLN
INTRAMUSCULAR | Status: AC
  Filled 2017-12-31: qty 10

## 2017-12-31 MED ORDER — ROPIVACAINE HCL 2 MG/ML IJ SOLN: 10.0000 mL | Freq: Once | INTRAMUSCULAR | Status: DC

## 2017-12-31 MED ORDER — DEXAMETHASONE SODIUM PHOSPHATE 10 MG/ML IJ SOLN
INTRAMUSCULAR | Status: AC
  Filled 2017-12-31: qty 1

## 2017-12-31 NOTE — Progress Notes (Signed)
Subjective:  Patient ID: Isaiah Johnson, male    DOB: Oct 21, 1965  Age: 52 y.o. MRN: [id number]  CC: Neck Pain   Procedure: Left trapezius muscle trigger point injection x2  HPI Isaiah Johnson presents for evaluation.  He was last seen several weeks ago and has gone through 3 sessions of physical therapy and is doing some traction.  He feels like he is making some progress with a 25 to 40% improvement.  He is having significantly diminished right side pain but still continues to have some pain in the persist on the left trapezius and neck.  His muscle strength has been at baseline with no recent changes.  Quality characteristic distribution of the pain of also been stable.  He is taking his Ultram twice a day which seems to work well for him.  He uses baclofen at bedtime.  He also uses anti-inflammatories at bedtime to help with pain relief.  The tramadol seems to keep him up  Outpatient Medications Prior to Visit  Medication Sig Dispense Refill  . acetaminophen (TYLENOL) 500 MG tablet Take 500 mg by mouth every 6 (six) hours as needed.    Marland Kitchen aspirin EC 81 MG tablet Take 81 mg by mouth daily.    Marland Kitchen b complex vitamins capsule Take 1 capsule by mouth daily.    . baclofen (LIORESAL) 10 MG tablet TAKE 0.5-1 TABLETS (5-10 MG TOTAL) BY MOUTH 3 (THREE) TIMES DAILY AS NEEDED FOR MUSCLE SPASMS 60 tablet 2  . fenofibrate micronized (LOFIBRA) 67 MG capsule Take 1 capsule (67 mg total) by mouth daily before breakfast. 90 capsule 3  . fluocinonide cream (LIDEX) 0.05 % Apply 1 application topically 2 (two) times daily as needed.    . Melatonin 200 MCG TABS Take by mouth.    . meloxicam (MOBIC) 15 MG tablet TAKE 1TAB BY MOUTH DAILY AS NEEDED FOR PAIN.TAKE 1-2 WEEKS AT A TIME, MAY HOLD DOSES IF NOT FLARE UP 90 tablet 1  . Multiple Vitamin (MULTIVITAMIN WITH MINERALS) TABS tablet Take 1 tablet by mouth daily.    . naproxen sodium (ALEVE) 220 MG tablet Take 220 mg by mouth.    . niacin 500 MG tablet Take 500 mg  by mouth at bedtime.    . Omega-3 Fatty Acids (FISH OIL) 1000 MG CAPS Take 1,000 mg by mouth daily.    . pravastatin (PRAVACHOL) 40 MG tablet Take 1 tablet (40 mg total) by mouth daily. 90 tablet 3  . Probiotic Product (PROBIOTIC DAILY PO) Take by mouth daily.    Marland Kitchen triamcinolone cream (KENALOG) 0.1 % Apply 1 application topically daily as needed.    . vitamin E (VITAMIN E) 1000 UNIT capsule Take 1,000 Units by mouth daily.     No facility-administered medications prior to visit.     Review of Systems CNS: No confusion or sedation Cardiac: No angina or palpitations GI: No abdominal pain or constipation Constitutional: No nausea vomiting fevers or chills  Objective:  BP 108/73   Pulse 77   Temp 98.4 F (36.9 C)   Resp 16   Ht 5\' 9"  (1.753 m)   Wt 220 lb (99.8 kg)   SpO2 97%   BMI 32.49 kg/m    BP Readings from Last 3 Encounters:  12/31/17 108/73  11/26/17 (!) 120/94  11/05/17 114/81     Wt Readings from Last 3 Encounters:  12/31/17 220 lb (99.8 kg)  11/26/17 218 lb (98.9 kg)  11/05/17 224 lb (101.6 kg)  Physical Exam Pt is alert and oriented PERRL EOMI HEART IS RRR no murmur or rub LCTA no wheezing or rales MUSCULOSKELETAL reveals 2 trigger points in the mid body trapezius left side.  Muscle tone and bulk is otherwise at baseline.  There is markedly less tenderness in the right trapezius.  Labs  Lab Results  Component Value Date   HGBA1C 5.4 05/29/2017   Lab Results  Component Value Date   LDLCALC 134 (H) 05/29/2017   CREATININE 1.01 05/29/2017    -------------------------------------------------------------------------------------------------------------------- Lab Results  Component Value Date   WBC 9.1 05/29/2017   HGB 16.3 05/29/2017   HCT 47.5 05/29/2017   PLT 303 05/29/2017   GLUCOSE 88 05/29/2017   CHOL 199 05/29/2017   TRIG 145 05/29/2017   HDL 38 (L) 05/29/2017   LDLCALC 134 (H) 05/29/2017   ALT 25 05/29/2017   AST 20 05/29/2017    NA 139 05/29/2017   K 4.6 05/29/2017   CL 104 05/29/2017   CREATININE 1.01 05/29/2017   BUN 20 05/29/2017   CO2 26 05/29/2017   HGBA1C 5.4 05/29/2017    --------------------------------------------------------------------------------------------------------------------- Patient was never admitted.   Assessment & Plan:   Reuel BoomDaniel was seen today for neck pain.  Diagnoses and all orders for this visit:  Cervicalgia -     INJECT TRIGGER POINT, 1 OR 2  Cervical radiculitis -     INJECT TRIGGER POINT, 1 OR 2  DDD (degenerative disc disease), cervical  Cervical facet syndrome  Other orders -     ropivacaine (PF) 2 mg/mL (0.2%) (NAROPIN) injection 10 mL -     dexamethasone (DECADRON) injection 10 mg        ----------------------------------------------------------------------------------------------------------------------  Problem List Items Addressed This Visit    None    Visit Diagnoses    Cervicalgia    -  Primary   Relevant Orders   INJECT TRIGGER POINT, 1 OR 2   Cervical radiculitis       Relevant Orders   INJECT TRIGGER POINT, 1 OR 2   DDD (degenerative disc disease), cervical       Relevant Medications   dexamethasone (DECADRON) injection 10 mg (Completed)   Cervical facet syndrome       Relevant Medications   dexamethasone (DECADRON) injection 10 mg (Completed)        ----------------------------------------------------------------------------------------------------------------------  1. Cervicalgia We will proceed with repeat trigger point injections today as reviewed with him on the left side to the after mentioned trigger points.  We will have him return to clinic in approximately 6 weeks for reevaluation possible repeat injection.  In the meantime I want him to continue with physical therapy exercising and traction.  I have gone over several exercises with him today to help with range of motion and strengthening as well as posture management. -  INJECT TRIGGER POINT, 1 OR 2  2. Cervical radiculitis As above - INJECT TRIGGER POINT, 1 OR 2  3. DDD (degenerative disc disease), cervical As above  4. Cervical facet syndrome He also would be a candidate for cervical facet injections if the pain persists.    ----------------------------------------------------------------------------------------------------------------------  I am having Orene Desanctisaniel Delashmit maintain his multivitamin with minerals, vitamin E, aspirin EC, niacin, Fish Oil, fluocinonide cream, triamcinolone cream, b complex vitamins, fenofibrate micronized, pravastatin, Probiotic Product (PROBIOTIC DAILY PO), meloxicam, acetaminophen, naproxen sodium, baclofen, and Melatonin. We administered dexamethasone.   Meds ordered this encounter  Medications  . ropivacaine (PF) 2 mg/mL (0.2%) (NAROPIN) injection 10 mL  . dexamethasone (DECADRON)  injection 10 mg   Patient's Medications  New Prescriptions   No medications on file  Previous Medications   ACETAMINOPHEN (TYLENOL) 500 MG TABLET    Take 500 mg by mouth every 6 (six) hours as needed.   ASPIRIN EC 81 MG TABLET    Take 81 mg by mouth daily.   B COMPLEX VITAMINS CAPSULE    Take 1 capsule by mouth daily.   BACLOFEN (LIORESAL) 10 MG TABLET    TAKE 0.5-1 TABLETS (5-10 MG TOTAL) BY MOUTH 3 (THREE) TIMES DAILY AS NEEDED FOR MUSCLE SPASMS   FENOFIBRATE MICRONIZED (LOFIBRA) 67 MG CAPSULE    Take 1 capsule (67 mg total) by mouth daily before breakfast.   FLUOCINONIDE CREAM (LIDEX) 0.05 %    Apply 1 application topically 2 (two) times daily as needed.   MELATONIN 200 MCG TABS    Take by mouth.   MELOXICAM (MOBIC) 15 MG TABLET    TAKE 1TAB BY MOUTH DAILY AS NEEDED FOR PAIN.TAKE 1-2 WEEKS AT A TIME, MAY HOLD DOSES IF NOT FLARE UP   MULTIPLE VITAMIN (MULTIVITAMIN WITH MINERALS) TABS TABLET    Take 1 tablet by mouth daily.   NAPROXEN SODIUM (ALEVE) 220 MG TABLET    Take 220 mg by mouth.   NIACIN 500 MG TABLET    Take 500 mg by mouth  at bedtime.   OMEGA-3 FATTY ACIDS (FISH OIL) 1000 MG CAPS    Take 1,000 mg by mouth daily.   PRAVASTATIN (PRAVACHOL) 40 MG TABLET    Take 1 tablet (40 mg total) by mouth daily.   PROBIOTIC PRODUCT (PROBIOTIC DAILY PO)    Take by mouth daily.   TRIAMCINOLONE CREAM (KENALOG) 0.1 %    Apply 1 application topically daily as needed.   VITAMIN E (VITAMIN E) 1000 UNIT CAPSULE    Take 1,000 Units by mouth daily.  Modified Medications   No medications on file  Discontinued Medications   No medications on file   ----------------------------------------------------------------------------------------------------------------------  Follow-up: Return in about 6 weeks (around 02/11/2018) for evaluation, procedure.  Trigger point injection: The area overlying the aforementioned trigger points were prepped with alcohol. They were then injected with a 25-gauge needle with 4 cc of ropivacaine 0.2% and Decadron 4 mg at each site after negative aspiration for heme. This was performed after informed consent was obtained and risks and benefits reviewed. She tolerated this procedure without difficulty and was convalesced and discharged to home in stable condition for follow-up as mentioned.  @Nieko Clarin  Pernell Dupre, MD@  Yevette Edwards, MD

## 2017-12-31 NOTE — Progress Notes (Signed)
Safety precautions to be maintained throughout the outpatient stay will include: orient to surroundings, keep bed in low position, maintain call bell within reach at all times, provide assistance with transfer out of bed and ambulation.  

## 2018-01-03 DIAGNOSIS — M542 Cervicalgia: Secondary | ICD-10-CM | POA: Diagnosis not present

## 2018-01-08 DIAGNOSIS — M542 Cervicalgia: Secondary | ICD-10-CM | POA: Diagnosis not present

## 2018-01-09 DIAGNOSIS — D2262 Melanocytic nevi of left upper limb, including shoulder: Secondary | ICD-10-CM | POA: Diagnosis not present

## 2018-01-09 DIAGNOSIS — L280 Lichen simplex chronicus: Secondary | ICD-10-CM | POA: Diagnosis not present

## 2018-01-09 DIAGNOSIS — D2261 Melanocytic nevi of right upper limb, including shoulder: Secondary | ICD-10-CM | POA: Diagnosis not present

## 2018-01-09 DIAGNOSIS — D225 Melanocytic nevi of trunk: Secondary | ICD-10-CM | POA: Diagnosis not present

## 2018-01-09 DIAGNOSIS — L309 Dermatitis, unspecified: Secondary | ICD-10-CM | POA: Diagnosis not present

## 2018-01-10 DIAGNOSIS — M542 Cervicalgia: Secondary | ICD-10-CM | POA: Diagnosis not present

## 2018-01-14 DIAGNOSIS — M542 Cervicalgia: Secondary | ICD-10-CM | POA: Diagnosis not present

## 2018-01-17 DIAGNOSIS — M542 Cervicalgia: Secondary | ICD-10-CM | POA: Diagnosis not present

## 2018-01-21 DIAGNOSIS — M542 Cervicalgia: Secondary | ICD-10-CM | POA: Diagnosis not present

## 2018-01-24 DIAGNOSIS — M542 Cervicalgia: Secondary | ICD-10-CM | POA: Diagnosis not present

## 2018-01-28 DIAGNOSIS — M542 Cervicalgia: Secondary | ICD-10-CM | POA: Diagnosis not present

## 2018-02-04 DIAGNOSIS — M542 Cervicalgia: Secondary | ICD-10-CM | POA: Diagnosis not present

## 2018-02-07 DIAGNOSIS — M542 Cervicalgia: Secondary | ICD-10-CM | POA: Diagnosis not present

## 2018-02-11 ENCOUNTER — Ambulatory Visit: Payer: BLUE CROSS/BLUE SHIELD | Admitting: Anesthesiology

## 2018-02-11 DIAGNOSIS — M542 Cervicalgia: Secondary | ICD-10-CM | POA: Diagnosis not present

## 2018-02-18 DIAGNOSIS — M542 Cervicalgia: Secondary | ICD-10-CM | POA: Diagnosis not present

## 2018-02-21 DIAGNOSIS — M542 Cervicalgia: Secondary | ICD-10-CM | POA: Diagnosis not present

## 2018-02-25 DIAGNOSIS — M542 Cervicalgia: Secondary | ICD-10-CM | POA: Diagnosis not present

## 2018-02-28 DIAGNOSIS — M542 Cervicalgia: Secondary | ICD-10-CM | POA: Diagnosis not present

## 2018-03-03 NOTE — Telephone Encounter (Signed)
Can you assist patient with medical record release request?  Saralyn PilarAlexander Ria Redcay, DO [facility] 03/03/2018, 6:39 PM

## 2018-03-04 DIAGNOSIS — M542 Cervicalgia: Secondary | ICD-10-CM | POA: Diagnosis not present

## 2018-03-06 ENCOUNTER — Other Ambulatory Visit: Payer: Self-pay | Admitting: Anesthesiology

## 2018-03-08 ENCOUNTER — Other Ambulatory Visit: Payer: Self-pay | Admitting: Family Medicine

## 2018-03-08 DIAGNOSIS — G8929 Other chronic pain: Secondary | ICD-10-CM

## 2018-03-08 DIAGNOSIS — M542 Cervicalgia: Principal | ICD-10-CM

## 2018-03-11 DIAGNOSIS — M542 Cervicalgia: Secondary | ICD-10-CM | POA: Diagnosis not present

## 2018-03-18 ENCOUNTER — Encounter: Payer: Self-pay | Admitting: Anesthesiology

## 2018-03-18 ENCOUNTER — Ambulatory Visit: Payer: BLUE CROSS/BLUE SHIELD | Attending: Anesthesiology | Admitting: Anesthesiology

## 2018-03-18 VITALS — BP 123/86 | HR 80 | Temp 98.4°F | Ht 69.0 in | Wt 225.0 lb

## 2018-03-18 DIAGNOSIS — M5030 Other cervical disc degeneration, unspecified cervical region: Secondary | ICD-10-CM

## 2018-03-18 DIAGNOSIS — Z791 Long term (current) use of non-steroidal anti-inflammatories (NSAID): Secondary | ICD-10-CM | POA: Diagnosis not present

## 2018-03-18 DIAGNOSIS — M542 Cervicalgia: Secondary | ICD-10-CM | POA: Diagnosis not present

## 2018-03-18 DIAGNOSIS — M5010 Cervical disc disorder with radiculopathy, unspecified cervical region: Secondary | ICD-10-CM | POA: Insufficient documentation

## 2018-03-18 DIAGNOSIS — M47812 Spondylosis without myelopathy or radiculopathy, cervical region: Secondary | ICD-10-CM | POA: Diagnosis not present

## 2018-03-18 DIAGNOSIS — Z7982 Long term (current) use of aspirin: Secondary | ICD-10-CM | POA: Insufficient documentation

## 2018-03-18 DIAGNOSIS — R531 Weakness: Secondary | ICD-10-CM | POA: Diagnosis not present

## 2018-03-18 DIAGNOSIS — M5412 Radiculopathy, cervical region: Secondary | ICD-10-CM | POA: Diagnosis not present

## 2018-03-18 DIAGNOSIS — Z79899 Other long term (current) drug therapy: Secondary | ICD-10-CM | POA: Insufficient documentation

## 2018-03-18 MED ORDER — TRAMADOL HCL 50 MG PO TABS: 50.0000 mg | ORAL_TABLET | Freq: Four times a day (QID) | ORAL | 1 refills | Status: DC | PRN

## 2018-03-18 MED ORDER — ALPRAZOLAM 1 MG PO TABS: 1.0000 mg | ORAL_TABLET | Freq: Every evening | ORAL | 0 refills | Status: AC | PRN

## 2018-03-18 NOTE — Patient Instructions (Addendum)
__________________________________________________________________ Follow up with MRI  You have been given prescription for Xanax 1 tablet to take before MRI.   Prescription given for Tramadol 50mg  to last until 04/18/2018.  __________________________  Preparing for your procedure (without sedation)  Instructions: . Oral Intake: Do not eat or drink anything for at least 3 hours prior to your procedure. . Transportation: Unless otherwise stated by your physician, you may drive yourself after the procedure. . Blood Pressure Medicine: Take your blood pressure medicine with a sip of water the morning of the procedure. . Blood thinners: Notify our staff if you are taking any blood thinners. Depending on which one you take, there will be specific instructions on how and when to stop it. . Diabetics on insulin: Notify the staff so that you can be scheduled 1st case in the morning. If your diabetes requires high dose insulin, take only  of your normal insulin dose the morning of the procedure and notify the staff that you have done so. . Preventing infections: Shower with an antibacterial soap the morning of your procedure.  . Build-up your immune system: Take 1000 mg of Vitamin C with every meal (3 times a day) the day prior to your procedure. Marland Kitchen. Antibiotics: Inform the staff if you have a condition or reason that requires you to take antibiotics before dental procedures. . Pregnancy: If you are pregnant, call and cancel the procedure. . Sickness: If you have a cold, fever, or any active infections, call and cancel the procedure. . Arrival: You must be in the facility at least 30 minutes prior to your scheduled procedure. . Children: Do not bring any children with you. . Dress appropriately: Bring dark clothing that you would not mind if they get stained. . Valuables: Do not bring any jewelry or valuables.  Procedure appointments are reserved for interventional treatments only. Marland Kitchen. No Prescription  Refills. . No medication changes will be discussed during procedure appointments. . No disability issues will be discussed.  Reasons to call and reschedule or cancel your procedure: (Following these recommendations will minimize the risk of a serious complication.) . Surgeries: Avoid having procedures within 2 weeks of any surgery. (Avoid for 2 weeks before or after any surgery). . Flu Shots: Avoid having procedures within 2 weeks of a flu shots or . (Avoid for 2 weeks before or after immunizations). . Barium: Avoid having a procedure within 7-10 days after having had a radiological study involving the use of radiological contrast. (Myelograms, Barium swallow or enema study). . Heart attacks: Avoid any elective procedures or surgeries for the initial 6 months after a "Myocardial Infarction" (Heart Attack). . Blood thinners: It is imperative that you stop these medications before procedures. Let us know if you if you take any blood thinner.  . Infection: Avoid procedures during or within two weeks of an infection (including chest colds or gastrointestinal problems). Symptoms associated with infections include: Localized redness, fever, chills, night sweats or profuse sweating, burning sensation when voiding, cough, congestion, stuffiness, runny nose, sore throat, diarrhea, nausea, vomiting, cold or Flu symptoms, recent or current infections. It is specially important if the infection is over the area that we intend to treat. Marland Kitchen. Heart and lung problems: Symptoms that may suggest an active cardiopulmonary problem include: cough, chest pain, breathing difficulties or shortness of breath, dizziness, ankle swelling, uncontrolled high or unusually low blood pressure, and/or palpitations. If you are experiencing any of these symptoms, cancel your procedure and contact your primary care physician for an evaluation.  Remember:  Regular Business hours are:  Monday to Thursday 8:00 AM to 4:00 PM  Provider's  Schedule: Isaiah Metz, MD:  Procedure days: Tuesday and Thursday 7:30 AM to 4:00 PM  Isaiah Jolly, MD:  Procedure days: Monday and Wednesday 7:30 AM to 4:00 PM ____________________________________________________________________________________________

## 2018-03-18 NOTE — Progress Notes (Signed)
Nursing Pain Medication Assessment:  Safety precautions to be maintained throughout the outpatient stay will include: orient to surroundings, keep bed in low position, maintain call bell within reach at all times, provide assistance with transfer out of bed and ambulation.  Medication Inspection Compliance: Isaiah Johnson did not comply with our request to bring his pills to be counted. He was reminded that bringing the medication bottles, even when empty, is a requirement.  Medication: None brought in. (Tramadol)  Pill/Patch Count: None available to be counted. Bottle Appearance: No container available. Did not bring bottle(s) to appointment. Filled Date: N/A Last Medication intake:  Today

## 2018-03-18 NOTE — Progress Notes (Signed)
Subjective:  Patient ID: Isaiah Johnson, male    DOB: May 11, 1965  Age: 52 y.o. MRN: [id number]  CC: No chief complaint on file.   Procedure: None  HPI [name] presents for reevaluation.  He was last seen several weeks ago and had trigger point injections for his cervical neck pain.  He is continue to have pain in the same regions of the bilateral trapezius muscles and neck.  In the past he reports that he has had cervical epidural steroid injections with more significant success.  He has been doing his physical therapy exercises without much relief.  Otherwise he is in his usual state of health.  He has noted some weakness on the right arm and with extension.  Otherwise the quality characteristic distribution of his pain to been stable in nature.  He is taking tramadol for pain relief and generally reports taking 1/day and this is effective and gives him good relief but he breakthrough with pain throughout the remainder of the day.  Outpatient Medications Prior to Visit  Medication Sig Dispense Refill  . acetaminophen (TYLENOL) 500 MG tablet Take 500 mg by mouth every 6 (six) hours as needed.    Marland Kitchen aspirin EC 81 MG tablet Take 81 mg by mouth daily.    Marland Kitchen b complex vitamins capsule Take 1 capsule by mouth daily.    . baclofen (LIORESAL) 10 MG tablet TAKE 0.5-1 TABLETS (5-10 MG TOTAL) BY MOUTH 3 (THREE) TIMES DAILY AS NEEDED FOR MUSCLE SPASMS 60 tablet 2  . fenofibrate micronized (LOFIBRA) 67 MG capsule Take 1 capsule (67 mg total) by mouth daily before breakfast. 90 capsule 3  . fluocinonide cream (LIDEX) 0.05 % Apply 1 application topically 2 (two) times daily as needed.    . Melatonin 200 MCG TABS Take by mouth.    . meloxicam (MOBIC) 15 MG tablet TAKE 1TAB BY MOUTH DAILY AS NEEDED FOR PAIN.TAKE 1-2 WEEKS AT A TIME, MAY HOLD DOSES IF NOT FLARE UP 90 tablet 1  . Multiple Vitamin (MULTIVITAMIN WITH MINERALS) TABS tablet Take 1 tablet by mouth daily.    . naproxen sodium (ALEVE) 220 MG  tablet Take 220 mg by mouth.    . niacin 500 MG tablet Take 500 mg by mouth at bedtime.    . Omega-3 Fatty Acids (FISH OIL) 1000 MG CAPS Take 1,000 mg by mouth daily.    . pravastatin (PRAVACHOL) 40 MG tablet Take 1 tablet (40 mg total) by mouth daily. 90 tablet 3  . Probiotic Product (PROBIOTIC DAILY PO) Take by mouth daily.    Marland Kitchen triamcinolone cream (KENALOG) 0.1 % Apply 1 application topically daily as needed.    . vitamin E (VITAMIN E) 1000 UNIT capsule Take 1,000 Units by mouth daily.    . traMADol (ULTRAM) 50 MG tablet      No facility-administered medications prior to visit.     Review of Systems CNS: No confusion or sedation Cardiac: No angina or palpitations GI: No abdominal pain or constipation Constitutional: No nausea vomiting fevers or chills  Objective:  BP 123/86   Pulse 80   Temp 98.4 F (36.9 C)   Ht 5\' 9"  (1.753 m)   Wt 225 lb (102.1 kg)   HC 18" (45.7 cm)   SpO2 98%   BMI 33.23 kg/m    BP Readings from Last 3 Encounters:  03/18/18 123/86  12/31/17 108/73  11/26/17 (!) 120/94     Wt Readings from Last 3 Encounters:  03/18/18 225 lb (  102.1 kg)  12/31/17 220 lb (99.8 kg)  11/26/17 218 lb (98.9 kg)     Physical Exam Pt is alert and oriented PERRL EOMI HEART IS RRR no murmur or rub LCTA no wheezing or rales MUSCULOSKELETAL reveals weakness on extension rated at 4/5 right side to the tricep otherwise his muscle tone and bulk is good with good bicep strength bilaterally and wrist strength is intact.  He has trigger points still noted in the bilateral trapezius muscles.  Labs  Lab Results  Component Value Date   HGBA1C 5.4 05/29/2017   Lab Results  Component Value Date   LDLCALC 134 (H) 05/29/2017   CREATININE 1.01 05/29/2017    -------------------------------------------------------------------------------------------------------------------- Lab Results  Component Value Date   WBC 9.1 05/29/2017   HGB 16.3 05/29/2017   HCT 47.5  05/29/2017   PLT 303 05/29/2017   GLUCOSE 88 05/29/2017   CHOL 199 05/29/2017   TRIG 145 05/29/2017   HDL 38 (L) 05/29/2017   LDLCALC 134 (H) 05/29/2017   ALT 25 05/29/2017   AST 20 05/29/2017   NA 139 05/29/2017   K 4.6 05/29/2017   CL 104 05/29/2017   CREATININE 1.01 05/29/2017   BUN 20 05/29/2017   CO2 26 05/29/2017   HGBA1C 5.4 05/29/2017    --------------------------------------------------------------------------------------------------------------------- Patient was never admitted.   Assessment & Plan:   Diagnoses and all orders for this visit:  Cervical radiculitis -     MR CERVICAL SPINE WO CONTRAST; Future -     Cervical Epidural Injection; Future  Cervicalgia -     Cervical Epidural Injection; Future  DDD (degenerative disc disease), cervical -     Cervical Epidural Injection; Future  Cervical facet syndrome  Other orders -     traMADol (ULTRAM) 50 MG tablet; Take 1 tablet (50 mg total) by mouth every 6 (six) hours as needed for moderate pain.        ----------------------------------------------------------------------------------------------------------------------  Problem List Items Addressed This Visit    None    Visit Diagnoses    Cervical radiculitis    -  Primary   Relevant Orders   MR CERVICAL SPINE WO CONTRAST   Cervical Epidural Injection   Cervicalgia       Relevant Orders   Cervical Epidural Injection   DDD (degenerative disc disease), cervical       Relevant Medications   traMADol (ULTRAM) 50 MG tablet   Other Relevant Orders   Cervical Epidural Injection   Cervical facet syndrome       Relevant Medications   traMADol (ULTRAM) 50 MG tablet        ----------------------------------------------------------------------------------------------------------------------  1. Cervical radiculitis I am going to order a cervical MRI for further evaluation for pathology in the neck.  He is having weakness with extension on the  right arm and persistent pain that is been unremitting and has failed conservative therapy.  Based on the findings we may proceed with a cervical epidural injection at his next visit in 1 month.  We have gone over the risks and benefits of the procedure with him in full detail. - MR CERVICAL SPINE WO CONTRAST; Future - Cervical Epidural Injection; Future  2. Cervicalgia As above and continue with stretching strengthening exercises - Cervical Epidural Injection; Future  3. DDD (degenerative disc disease), cervical As above - Cervical Epidural Injection; Future  4. Cervical facet syndrome Continue with tramadol and he may increase this to 3 times daily dosing.  A repeat prescription was given today.    ----------------------------------------------------------------------------------------------------------------------  I have changed Carsten Schimek's traMADol. I am also having him maintain his multivitamin with minerals, vitamin E, aspirin EC, niacin, Fish Oil, fluocinonide cream, triamcinolone cream, b complex vitamins, fenofibrate micronized, pravastatin, Probiotic Product (PROBIOTIC DAILY PO), meloxicam, acetaminophen, naproxen sodium, Melatonin, and baclofen.   Meds ordered this encounter  Medications  . traMADol (ULTRAM) 50 MG tablet    Sig: Take 1 tablet (50 mg total) by mouth every 6 (six) hours as needed for moderate pain.    Dispense:  90 tablet    Refill:  1   Patient's Medications  New Prescriptions   No medications on file  Previous Medications   ACETAMINOPHEN (TYLENOL) 500 MG TABLET    Take 500 mg by mouth every 6 (six) hours as needed.   ASPIRIN EC 81 MG TABLET    Take 81 mg by mouth daily.   B COMPLEX VITAMINS CAPSULE    Take 1 capsule by mouth daily.   BACLOFEN (LIORESAL) 10 MG TABLET    TAKE 0.5-1 TABLETS (5-10 MG TOTAL) BY MOUTH 3 (THREE) TIMES DAILY AS NEEDED FOR MUSCLE SPASMS   FENOFIBRATE MICRONIZED (LOFIBRA) 67 MG CAPSULE    Take 1 capsule (67 mg total) by  mouth daily before breakfast.   FLUOCINONIDE CREAM (LIDEX) 0.05 %    Apply 1 application topically 2 (two) times daily as needed.   MELATONIN 200 MCG TABS    Take by mouth.   MELOXICAM (MOBIC) 15 MG TABLET    TAKE 1TAB BY MOUTH DAILY AS NEEDED FOR PAIN.TAKE 1-2 WEEKS AT A TIME, MAY HOLD DOSES IF NOT FLARE UP   MULTIPLE VITAMIN (MULTIVITAMIN WITH MINERALS) TABS TABLET    Take 1 tablet by mouth daily.   NAPROXEN SODIUM (ALEVE) 220 MG TABLET    Take 220 mg by mouth.   NIACIN 500 MG TABLET    Take 500 mg by mouth at bedtime.   OMEGA-3 FATTY ACIDS (FISH OIL) 1000 MG CAPS    Take 1,000 mg by mouth daily.   PRAVASTATIN (PRAVACHOL) 40 MG TABLET    Take 1 tablet (40 mg total) by mouth daily.   PROBIOTIC PRODUCT (PROBIOTIC DAILY PO)    Take by mouth daily.   TRIAMCINOLONE CREAM (KENALOG) 0.1 %    Apply 1 application topically daily as needed.   VITAMIN E (VITAMIN E) 1000 UNIT CAPSULE    Take 1,000 Units by mouth daily.  Modified Medications   Modified Medication Previous Medication   TRAMADOL (ULTRAM) 50 MG TABLET traMADol (ULTRAM) 50 MG tablet      Take 1 tablet (50 mg total) by mouth every 6 (six) hours as needed for moderate pain.      Discontinued Medications   No medications on file   ----------------------------------------------------------------------------------------------------------------------  Follow-up: Return in about 1 month (around 04/18/2018) for procedure.    Yevette EdwardsJames G Adams, MD

## 2018-03-25 DIAGNOSIS — M542 Cervicalgia: Secondary | ICD-10-CM | POA: Diagnosis not present

## 2018-03-26 ENCOUNTER — Telehealth: Payer: Self-pay | Admitting: *Deleted

## 2018-03-28 DIAGNOSIS — M542 Cervicalgia: Secondary | ICD-10-CM | POA: Diagnosis not present

## 2018-03-31 DIAGNOSIS — M542 Cervicalgia: Secondary | ICD-10-CM | POA: Diagnosis not present

## 2018-04-04 DIAGNOSIS — M542 Cervicalgia: Secondary | ICD-10-CM | POA: Diagnosis not present

## 2018-04-09 HISTORY — PX: NECK SURGERY: SHX720

## 2018-04-11 DIAGNOSIS — M542 Cervicalgia: Secondary | ICD-10-CM | POA: Diagnosis not present

## 2018-04-14 ENCOUNTER — Ambulatory Visit: Payer: BLUE CROSS/BLUE SHIELD

## 2018-04-15 ENCOUNTER — Ambulatory Visit (HOSPITAL_BASED_OUTPATIENT_CLINIC_OR_DEPARTMENT_OTHER): Payer: BLUE CROSS/BLUE SHIELD | Admitting: Anesthesiology

## 2018-04-15 ENCOUNTER — Other Ambulatory Visit: Payer: Self-pay | Admitting: Anesthesiology

## 2018-04-15 ENCOUNTER — Ambulatory Visit
Admission: RE | Admit: 2018-04-15 | Discharge: 2018-04-15 | Disposition: A | Discharge status: Home / Self Care | Payer: BLUE CROSS/BLUE SHIELD | Source: Ambulatory Visit | Attending: Anesthesiology | Admitting: Anesthesiology

## 2018-04-15 ENCOUNTER — Encounter: Payer: Self-pay | Admitting: Anesthesiology

## 2018-04-15 VITALS — BP 132/98 | HR 76 | Temp 98.0°F | Resp 16 | Ht 69.0 in | Wt 225.0 lb

## 2018-04-15 DIAGNOSIS — M5412 Radiculopathy, cervical region: Secondary | ICD-10-CM | POA: Diagnosis not present

## 2018-04-15 DIAGNOSIS — R52 Pain, unspecified: Secondary | ICD-10-CM | POA: Insufficient documentation

## 2018-04-15 DIAGNOSIS — M542 Cervicalgia: Secondary | ICD-10-CM | POA: Insufficient documentation

## 2018-04-15 DIAGNOSIS — M5030 Other cervical disc degeneration, unspecified cervical region: Secondary | ICD-10-CM

## 2018-04-15 DIAGNOSIS — M47812 Spondylosis without myelopathy or radiculopathy, cervical region: Secondary | ICD-10-CM | POA: Diagnosis not present

## 2018-04-15 MED ORDER — LIDOCAINE HCL (PF) 1 % IJ SOLN
INTRAMUSCULAR | Status: AC
  Filled 2018-04-15: qty 10

## 2018-04-15 MED ORDER — ROPIVACAINE HCL 2 MG/ML IJ SOLN
INTRAMUSCULAR | Status: AC
  Filled 2018-04-15: qty 10

## 2018-04-15 MED ORDER — IOPAMIDOL (ISOVUE-M 200) INJECTION 41%
20.0000 mL | Freq: Once | INTRAMUSCULAR | Status: DC | PRN
  Administered 2018-04-15: 10 mL
  Filled 2018-04-15: qty 20

## 2018-04-15 MED ORDER — DEXAMETHASONE SODIUM PHOSPHATE 10 MG/ML IJ SOLN
INTRAMUSCULAR | Status: AC
  Filled 2018-04-15: qty 1

## 2018-04-15 MED ORDER — ROPIVACAINE HCL 2 MG/ML IJ SOLN: 10.0000 mL | Freq: Once | INTRAMUSCULAR | Status: DC

## 2018-04-15 MED ORDER — DEXAMETHASONE SODIUM PHOSPHATE 10 MG/ML IJ SOLN
10.0000 mg | Freq: Once | INTRAMUSCULAR | Status: AC
  Administered 2018-04-15: 10 mg

## 2018-04-15 MED ORDER — SODIUM CHLORIDE (PF) 0.9 % IJ SOLN
INTRAMUSCULAR | Status: AC
  Filled 2018-04-15: qty 10

## 2018-04-15 MED ORDER — SODIUM CHLORIDE 0.9% FLUSH
10.0000 mL | Freq: Once | INTRAVENOUS | Status: AC
  Administered 2018-04-15: 10 mL

## 2018-04-15 MED ORDER — LIDOCAINE HCL (PF) 1 % IJ SOLN
5.0000 mL | Freq: Once | INTRAMUSCULAR | Status: AC
  Administered 2018-04-15: 5 mL via SUBCUTANEOUS

## 2018-04-15 NOTE — Progress Notes (Signed)
Subjective:  Patient ID: Isaiah DesanctisDaniel Faeth, male    DOB: [birth year]  Age: 53 y.o. MRN: [id number]  CC: Neck Pain (radiates to shoulders bilaterally, left worse)   Procedure: C7-T1 cervical epidural without sedation with transition to T1-T2 placement  HPI Isaiah Johnson presents for reevaluation.  He was last seen several weeks ago and has had a series of trigger point injections for his chronic cervicalgia.  He gets transient relief with these however sometimes even gets some exacerbation of the muscle spasms in the back of the neck and shoulders.  He continues to have numbness and tingling radiating into the fourth and fifth digits of the right hand.  He occasionally has some problems with hand grasp and extension at the right arm but this is intermittent in nature.  Otherwise he is in his usual state of health.  He has been scheduled for a cervical MRI but this was recently denied.  He is scheduled presently to have this done in the next several days.  The pain intensity has been severe in nature and despite conservative care physical therapy and medication management he cannot get relief.  He states in the past has had cervical epidurals x5 done without sedation and generally gets very good relief approximately 70% in intensity lasting 6 weeks or more.  He desires to proceed with that today.  Outpatient Medications Prior to Visit  Medication Sig Dispense Refill  . acetaminophen (TYLENOL) 500 MG tablet Take 500 mg by mouth every 6 (six) hours as needed.    Marland Kitchen. aspirin EC 81 MG tablet Take 81 mg by mouth daily.    Marland Kitchen. b complex vitamins capsule Take 1 capsule by mouth daily.    . baclofen (LIORESAL) 10 MG tablet TAKE 0.5-1 TABLETS (5-10 MG TOTAL) BY MOUTH 3 (THREE) TIMES DAILY AS NEEDED FOR MUSCLE SPASMS 60 tablet 2  . fenofibrate micronized (LOFIBRA) 67 MG capsule Take 1 capsule (67 mg total) by mouth daily before breakfast. 90 capsule 3  . fluocinonide cream (LIDEX) 0.05 % Apply 1 application  topically 2 (two) times daily as needed.    . Melatonin 200 MCG TABS Take by mouth.    . meloxicam (MOBIC) 15 MG tablet TAKE 1TAB BY MOUTH DAILY AS NEEDED FOR PAIN.TAKE 1-2 WEEKS AT A TIME, MAY HOLD DOSES IF NOT FLARE UP 90 tablet 1  . Multiple Vitamin (MULTIVITAMIN WITH MINERALS) TABS tablet Take 1 tablet by mouth daily.    . naproxen sodium (ALEVE) 220 MG tablet Take 220 mg by mouth.    . niacin 500 MG tablet Take 500 mg by mouth at bedtime.    . Omega-3 Fatty Acids (FISH OIL) 1000 MG CAPS Take 1,000 mg by mouth daily.    . pravastatin (PRAVACHOL) 40 MG tablet Take 1 tablet (40 mg total) by mouth daily. 90 tablet 3  . Probiotic Product (PROBIOTIC DAILY PO) Take by mouth daily.    . traMADol (ULTRAM) 50 MG tablet Take 1 tablet (50 mg total) by mouth every 6 (six) hours as needed for moderate pain. 90 tablet 1  . triamcinolone cream (KENALOG) 0.1 % Apply 1 application topically daily as needed.    . vitamin E (VITAMIN E) 1000 UNIT capsule Take 1,000 Units by mouth daily.     No facility-administered medications prior to visit.     Review of Systems CNS: No confusion or sedation Cardiac: No angina or palpitations GI: No abdominal pain or constipation Constitutional: No nausea vomiting fevers or chills  Objective:  BP (!) 135/102   Pulse 77   Temp 98 F (36.7 C)   Resp 20   Ht 5\' 9"  (1.753 m)   Wt 225 lb (102.1 kg)   SpO2 97%   BMI 33.23 kg/m    BP Readings from Last 3 Encounters:  04/15/18 (!) 135/102  03/18/18 123/86  12/31/17 108/73     Wt Readings from Last 3 Encounters:  04/15/18 225 lb (102.1 kg)  03/18/18 225 lb (102.1 kg)  12/31/17 220 lb (99.8 kg)     Physical Exam Pt is alert and oriented PERRL EOMI HEART IS RRR no murmur or rub LCTA no wheezing or rales MUSCULOSKELETAL reveals some paraspinous muscle tenderness that persist in the cervical region and trapezius muscles bilaterally.  His muscle tone and bulk is good.  Labs  Lab Results  Component  Value Date   HGBA1C 5.4 05/29/2017   Lab Results  Component Value Date   LDLCALC 134 (H) 05/29/2017   CREATININE 1.01 05/29/2017    -------------------------------------------------------------------------------------------------------------------- Lab Results  Component Value Date   WBC 9.1 05/29/2017   HGB 16.3 05/29/2017   HCT 47.5 05/29/2017   PLT 303 05/29/2017   GLUCOSE 88 05/29/2017   CHOL 199 05/29/2017   TRIG 145 05/29/2017   HDL 38 (L) 05/29/2017   LDLCALC 134 (H) 05/29/2017   ALT 25 05/29/2017   AST 20 05/29/2017   NA 139 05/29/2017   K 4.6 05/29/2017   CL 104 05/29/2017   CREATININE 1.01 05/29/2017   BUN 20 05/29/2017   CO2 26 05/29/2017   HGBA1C 5.4 05/29/2017    --------------------------------------------------------------------------------------------------------------------- No results found.   Assessment & Plan:   Panth was seen today for neck pain.  Diagnoses and all orders for this visit:  Cervical radiculitis  Cervicalgia  DDD (degenerative disc disease), cervical  Cervical facet syndrome  Other orders -     dexamethasone (DECADRON) injection 10 mg -     sodium chloride flush (NS) 0.9 % injection 10 mL -     ropivacaine (PF) 2 mg/mL (0.2%) (NAROPIN) injection 10 mL -     lidocaine (PF) (XYLOCAINE) 1 % injection 5 mL -     iopamidol (ISOVUE-M) 41 % intrathecal injection 20 mL        ----------------------------------------------------------------------------------------------------------------------  Problem List Items Addressed This Visit    None    Visit Diagnoses    Cervical radiculitis    -  Primary   Cervicalgia       DDD (degenerative disc disease), cervical       Relevant Medications   dexamethasone (DECADRON) injection 10 mg (Completed)   Cervical facet syndrome       Relevant Medications   dexamethasone (DECADRON) injection 10 mg (Completed)         ----------------------------------------------------------------------------------------------------------------------  1. Cervical radiculitis As discussed with patient and per patient request we will proceed with a cervical epidural steroid injection today.  Will attempt to do this at the C7-C8 interspace.  Most of his symptoms are in the C7 distribution right side greater than left.  We have gone over the risks and benefits of the procedure with him in full detail and all questions have been answered.  We will schedule him for return to clinic in 1 month for reevaluation.  2. Cervicalgia As above and continue conservative care with physical therapy and stretching strengthening exercises as reviewed  3. DDD (degenerative disc disease), cervical   4. Cervical facet syndrome     ----------------------------------------------------------------------------------------------------------------------  I am having Isaiah Desanctisaniel Jayne maintain his multivitamin with minerals, vitamin E, aspirin EC, niacin, Fish Oil, fluocinonide cream, triamcinolone cream, b complex vitamins, fenofibrate micronized, pravastatin, Probiotic Product (PROBIOTIC DAILY PO), meloxicam, acetaminophen, naproxen sodium, Melatonin, baclofen, and traMADol. We administered dexamethasone, sodium chloride flush, lidocaine (PF), and iopamidol.   Meds ordered this encounter  Medications  . dexamethasone (DECADRON) injection 10 mg  . sodium chloride flush (NS) 0.9 % injection 10 mL  . ropivacaine (PF) 2 mg/mL (0.2%) (NAROPIN) injection 10 mL  . lidocaine (PF) (XYLOCAINE) 1 % injection 5 mL  . iopamidol (ISOVUE-M) 41 % intrathecal injection 20 mL   Patient's Medications  New Prescriptions   No medications on file  Previous Medications   ACETAMINOPHEN (TYLENOL) 500 MG TABLET    Take 500 mg by mouth every 6 (six) hours as needed.   ASPIRIN EC 81 MG TABLET    Take 81 mg by mouth daily.   B COMPLEX VITAMINS CAPSULE    Take  1 capsule by mouth daily.   BACLOFEN (LIORESAL) 10 MG TABLET    TAKE 0.5-1 TABLETS (5-10 MG TOTAL) BY MOUTH 3 (THREE) TIMES DAILY AS NEEDED FOR MUSCLE SPASMS   FENOFIBRATE MICRONIZED (LOFIBRA) 67 MG CAPSULE    Take 1 capsule (67 mg total) by mouth daily before breakfast.   FLUOCINONIDE CREAM (LIDEX) 0.05 %    Apply 1 application topically 2 (two) times daily as needed.   MELATONIN 200 MCG TABS    Take by mouth.   MELOXICAM (MOBIC) 15 MG TABLET    TAKE 1TAB BY MOUTH DAILY AS NEEDED FOR PAIN.TAKE 1-2 WEEKS AT A TIME, MAY HOLD DOSES IF NOT FLARE UP   MULTIPLE VITAMIN (MULTIVITAMIN WITH MINERALS) TABS TABLET    Take 1 tablet by mouth daily.   NAPROXEN SODIUM (ALEVE) 220 MG TABLET    Take 220 mg by mouth.   NIACIN 500 MG TABLET    Take 500 mg by mouth at bedtime.   OMEGA-3 FATTY ACIDS (FISH OIL) 1000 MG CAPS    Take 1,000 mg by mouth daily.   PRAVASTATIN (PRAVACHOL) 40 MG TABLET    Take 1 tablet (40 mg total) by mouth daily.   PROBIOTIC PRODUCT (PROBIOTIC DAILY PO)    Take by mouth daily.   TRAMADOL (ULTRAM) 50 MG TABLET    Take 1 tablet (50 mg total) by mouth every 6 (six) hours as needed for moderate pain.   TRIAMCINOLONE CREAM (KENALOG) 0.1 %    Apply 1 application topically daily as needed.   VITAMIN E (VITAMIN E) 1000 UNIT CAPSULE    Take 1,000 Units by mouth daily.  Modified Medications   No medications on file  Discontinued Medications   No medications on file   ----------------------------------------------------------------------------------------------------------------------  Follow-up: Return in about 1 month (around 05/16/2018) for procedure, evaluation.  patient was taken to the fluoroscopy suite and placed in the prone position.  No IV sedation was requested.. The patient was responsive throughout and vital signs were stable throughout. The area overlying the cervical region was prepped with stereo prep 3 and then 1% lidocaine 2 cc was injected with a 25-gauge needle at the C7-T1  interspace. This was done with strict aseptic technique. I then advanced an 18-gauge Touhy needle using a hanging drop technique and AP fluoroscopic guidance.  at approximately 8 the patient did have pressure paresthesia and was unable to tolerate advancement of the needle.  Chose to abort placement at this level and then proceeded with a  T1-T2 interspace placement.  1% cocaine was injected at this level I then advanced an 18-gauge Touhy needle in the usual fashion midline approach using hanging drop technique to a depth approximately 9 cm.  Needle confirmation was in the oblique and AP view.  There was loss of resistance and no paresthesia.  2 cc of Isovue was injected yielding good epidural spread.  I then injected a mixture of 1 cc normal saline mixed with 1 cc of ropivacaine 0.2% and 10 mg of Decadron.  This was tolerated without difficulty the patient was convalesced and discharged home in stable condition for follow-up as mentioned in 1 month.  Yevette Edwards, MD

## 2018-04-15 NOTE — Patient Instructions (Signed)

## 2018-04-15 NOTE — Progress Notes (Signed)
Nursing Pain Medication Assessment:  Safety precautions to be maintained throughout the outpatient stay will include: orient to surroundings, keep bed in low position, maintain call bell within reach at all times, provide assistance with transfer out of bed and ambulation.  Medication Inspection Compliance: Mr. Gotshall did not comply with our request to bring his pills to be counted. He was reminded that bringing the medication bottles, even when empty, is a requirement.  Medication: None brought in. Pill/Patch Count: None available to be counted. Bottle Appearance: No container available. Did not bring bottle(s) to appointment. Filled Date: N/A Last Medication intake:  Today

## 2018-04-18 DIAGNOSIS — M542 Cervicalgia: Secondary | ICD-10-CM | POA: Diagnosis not present

## 2018-04-29 DIAGNOSIS — M542 Cervicalgia: Secondary | ICD-10-CM | POA: Diagnosis not present

## 2018-05-02 DIAGNOSIS — M542 Cervicalgia: Secondary | ICD-10-CM | POA: Diagnosis not present

## 2018-05-06 DIAGNOSIS — M542 Cervicalgia: Secondary | ICD-10-CM | POA: Diagnosis not present

## 2018-05-08 DIAGNOSIS — M542 Cervicalgia: Secondary | ICD-10-CM | POA: Diagnosis not present

## 2018-05-13 ENCOUNTER — Ambulatory Visit: Payer: BLUE CROSS/BLUE SHIELD | Admitting: Anesthesiology

## 2018-05-14 ENCOUNTER — Ambulatory Visit: Payer: BLUE CROSS/BLUE SHIELD | Admitting: Anesthesiology

## 2018-05-15 ENCOUNTER — Other Ambulatory Visit: Payer: Self-pay | Admitting: Family Medicine

## 2018-05-15 DIAGNOSIS — G8929 Other chronic pain: Secondary | ICD-10-CM

## 2018-05-15 DIAGNOSIS — M542 Cervicalgia: Principal | ICD-10-CM

## 2018-05-16 DIAGNOSIS — M542 Cervicalgia: Secondary | ICD-10-CM | POA: Diagnosis not present

## 2018-05-30 DIAGNOSIS — M542 Cervicalgia: Secondary | ICD-10-CM | POA: Diagnosis not present

## 2018-05-30 DIAGNOSIS — Z20828 Contact with and (suspected) exposure to other viral communicable diseases: Secondary | ICD-10-CM

## 2018-05-30 MED ORDER — OSELTAMIVIR PHOSPHATE 75 MG PO CAPS: 75.0000 mg | ORAL_CAPSULE | Freq: Every day | ORAL | 0 refills | Status: DC

## 2018-06-03 DIAGNOSIS — M542 Cervicalgia: Secondary | ICD-10-CM | POA: Diagnosis not present

## 2018-06-06 ENCOUNTER — Ambulatory Visit (HOSPITAL_BASED_OUTPATIENT_CLINIC_OR_DEPARTMENT_OTHER): Payer: BLUE CROSS/BLUE SHIELD | Admitting: Anesthesiology

## 2018-06-06 ENCOUNTER — Other Ambulatory Visit: Payer: Self-pay

## 2018-06-06 ENCOUNTER — Encounter: Payer: Self-pay | Admitting: Anesthesiology

## 2018-06-06 ENCOUNTER — Other Ambulatory Visit: Payer: Self-pay | Admitting: Anesthesiology

## 2018-06-06 ENCOUNTER — Ambulatory Visit
Admission: RE | Admit: 2018-06-06 | Discharge: 2018-06-06 | Disposition: A | Discharge status: Home / Self Care | Payer: BLUE CROSS/BLUE SHIELD | Source: Ambulatory Visit | Attending: Anesthesiology | Admitting: Anesthesiology

## 2018-06-06 VITALS — BP 126/94 | HR 97 | Temp 98.1°F | Resp 20 | Ht 69.0 in | Wt 230.0 lb

## 2018-06-06 DIAGNOSIS — R52 Pain, unspecified: Secondary | ICD-10-CM | POA: Diagnosis not present

## 2018-06-06 DIAGNOSIS — M5030 Other cervical disc degeneration, unspecified cervical region: Secondary | ICD-10-CM | POA: Insufficient documentation

## 2018-06-06 DIAGNOSIS — M542 Cervicalgia: Secondary | ICD-10-CM | POA: Insufficient documentation

## 2018-06-06 DIAGNOSIS — M5412 Radiculopathy, cervical region: Secondary | ICD-10-CM

## 2018-06-06 MED ORDER — LIDOCAINE HCL (PF) 1 % IJ SOLN
INTRAMUSCULAR | Status: AC
  Filled 2018-06-06: qty 5

## 2018-06-06 MED ORDER — ROPIVACAINE HCL 2 MG/ML IJ SOLN
10.0000 mL | Freq: Once | INTRAMUSCULAR | Status: AC
  Administered 2018-06-06: 10 mL via EPIDURAL
  Filled 2018-06-06: qty 10

## 2018-06-06 MED ORDER — TRAMADOL HCL 50 MG PO TABS: 50.0000 mg | ORAL_TABLET | Freq: Four times a day (QID) | ORAL | 1 refills | Status: AC | PRN

## 2018-06-06 MED ORDER — IOPAMIDOL (ISOVUE-M 200) INJECTION 41%
INTRAMUSCULAR | Status: AC
  Filled 2018-06-06: qty 10

## 2018-06-06 MED ORDER — DEXAMETHASONE SODIUM PHOSPHATE 10 MG/ML IJ SOLN
INTRAMUSCULAR | Status: AC
  Filled 2018-06-06: qty 1

## 2018-06-06 MED ORDER — DEXAMETHASONE SODIUM PHOSPHATE 4 MG/ML IJ SOLN
4.0000 mg | Freq: Once | INTRAMUSCULAR | Status: DC
  Filled 2018-06-06: qty 1

## 2018-06-06 MED ORDER — TRIAMCINOLONE ACETONIDE 40 MG/ML IJ SUSP
INTRAMUSCULAR | Status: AC
  Filled 2018-06-06: qty 1

## 2018-06-06 MED ORDER — SODIUM CHLORIDE (PF) 0.9 % IJ SOLN
INTRAMUSCULAR | Status: AC
  Filled 2018-06-06: qty 10

## 2018-06-06 MED ORDER — SODIUM CHLORIDE 0.9% FLUSH
10.0000 mL | Freq: Once | INTRAVENOUS | Status: AC
  Administered 2018-06-06: 10 mL

## 2018-06-06 MED ORDER — IOPAMIDOL (ISOVUE-M 200) INJECTION 41%
20.0000 mL | Freq: Once | INTRAMUSCULAR | Status: DC | PRN
  Administered 2018-06-06: 10 mL
  Filled 2018-06-06: qty 20

## 2018-06-06 MED ORDER — LIDOCAINE HCL (PF) 1 % IJ SOLN
5.0000 mL | Freq: Once | INTRAMUSCULAR | Status: AC
  Administered 2018-06-06: 5 mL via SUBCUTANEOUS
  Filled 2018-06-06: qty 5

## 2018-06-06 NOTE — Patient Instructions (Signed)

## 2018-06-06 NOTE — Progress Notes (Signed)
Nursing Pain Medication Assessment:  Safety precautions to be maintained throughout the outpatient stay will include: orient to surroundings, keep bed in low position, maintain call bell within reach at all times, provide assistance with transfer out of bed and ambulation.  Medication Inspection Compliance: Pill count conducted under aseptic conditions, in front of the patient. Neither the pills nor the bottle was removed from the patient's sight at any time. Once count was completed pills were immediately returned to the patient in their original bottle.  Medication: Tramadol (Ultram) Pill/Patch Count: 81 of 90 pills remain Pill/Patch Appearance: Markings consistent with prescribed medication Bottle Appearance: Standard pharmacy container. Clearly labeled. Filled Date: 01/04 / 2020 Last Medication intake:  Today

## 2018-06-06 NOTE — Progress Notes (Signed)
Subjective:  Patient ID: Isaiah Johnson, male    DOB: 1965-06-14  Age: 53 y.o. MRN: [id number]  CC: Neck Pain   Procedure: T1-T2 thoracic epidural steroid injection under fluoroscopic guidance with no sedation  HPI Erhardt Mancil presents for reevaluation.  He was last seen several weeks ago and had his first epidural injection.  He reports approximately a 50% reduction in his bilateral upper extremity pain.  He is experiencing less pain at night and with certain activity.  Though rotational movement of his neck does sometimes precipitate some numbness and tingling primarily right side hand greater than left his baseline neuralgic pain is reportedly better.  No change in upper extremity strength is mentioned.  He still using some tramadol but sparingly and feels like he is making good progress.  He still doing his physical therapy exercises as tolerated.  Based on his narcotic assessment sheet he is deriving good functional benefit from the tramadol with no side effects mentioned.  Outpatient Medications Prior to Visit  Medication Sig Dispense Refill  . acetaminophen (TYLENOL) 500 MG tablet Take 500 mg by mouth every 6 (six) hours as needed.    Marland Kitchen aspirin EC 81 MG tablet Take 81 mg by mouth daily.    Marland Kitchen b complex vitamins capsule Take 1 capsule by mouth daily.    . baclofen (LIORESAL) 10 MG tablet TAKE 0.5-1 TABLETS (5-10 MG TOTAL) BY MOUTH 3 (THREE) TIMES DAILY AS NEEDED FOR MUSCLE SPASMS 60 tablet 2  . fenofibrate micronized (LOFIBRA) 67 MG capsule Take 1 capsule (67 mg total) by mouth daily before breakfast. 90 capsule 3  . fluocinonide cream (LIDEX) 0.05 % Apply 1 application topically 2 (two) times daily as needed.    . Melatonin 200 MCG TABS Take by mouth.    . meloxicam (MOBIC) 15 MG tablet TAKE 1TAB BY MOUTH DAILY AS NEEDED FOR PAIN.TAKE 1-2 WEEKS AT A TIME, MAY HOLD DOSES IF NOT FLARE UP 90 tablet 1  . Multiple Vitamin (MULTIVITAMIN WITH MINERALS) TABS tablet Take 1 tablet by mouth  daily.    . naproxen sodium (ALEVE) 220 MG tablet Take 220 mg by mouth.    . niacin 500 MG tablet Take 500 mg by mouth at bedtime.    . Omega-3 Fatty Acids (FISH OIL) 1000 MG CAPS Take 1,000 mg by mouth daily.    Marland Kitchen oseltamivir (TAMIFLU) 75 MG capsule Take 1 capsule (75 mg total) by mouth daily. For 10 days. If develop symptoms change dosing to twice daily to finish course 10 capsule 0  . pravastatin (PRAVACHOL) 40 MG tablet Take 1 tablet (40 mg total) by mouth daily. 90 tablet 3  . Probiotic Product (PROBIOTIC DAILY PO) Take by mouth daily.    Marland Kitchen triamcinolone cream (KENALOG) 0.1 % Apply 1 application topically daily as needed.    . vitamin E (VITAMIN E) 1000 UNIT capsule Take 1,000 Units by mouth daily.    . traMADol (ULTRAM) 50 MG tablet Take 1 tablet (50 mg total) by mouth every 6 (six) hours as needed for moderate pain. 90 tablet 1   No facility-administered medications prior to visit.     Review of Systems CNS: No confusion or sedation Cardiac: No angina or palpitations GI: No abdominal pain or constipation Constitutional: No nausea vomiting fevers or chills  Objective:  BP (!) 126/94   Pulse 97   Temp 98.1 F (36.7 C) (Oral)   Resp 20   Ht 5\' 9"  (1.753 m)   Wt 230 lb (  104.3 kg)   SpO2 96%   BMI 33.97 kg/m    BP Readings from Last 3 Encounters:  06/06/18 (!) 126/94  04/15/18 (!) 132/98  03/18/18 123/86     Wt Readings from Last 3 Encounters:  06/06/18 230 lb (104.3 kg)  04/15/18 225 lb (102.1 kg)  03/18/18 225 lb (102.1 kg)     Physical Exam Pt is alert and oriented PERRL EOMI HEART IS RRR no murmur or rub LCTA no wheezing or rales MUSCULOSKELETAL reveals some paraspinous muscle tenderness in the high cervical region and some tenderness in the bilateral trapezius.  The strength is at baseline muscle tone and bulk is at baseline.  Labs  Lab Results  Component Value Date   HGBA1C 5.4 05/29/2017   Lab Results  Component Value Date   LDLCALC 134 (H)  05/29/2017   CREATININE 1.01 05/29/2017    -------------------------------------------------------------------------------------------------------------------- Lab Results  Component Value Date   WBC 9.1 05/29/2017   HGB 16.3 05/29/2017   HCT 47.5 05/29/2017   PLT 303 05/29/2017   GLUCOSE 88 05/29/2017   CHOL 199 05/29/2017   TRIG 145 05/29/2017   HDL 38 (L) 05/29/2017   LDLCALC 134 (H) 05/29/2017   ALT 25 05/29/2017   AST 20 05/29/2017   NA 139 05/29/2017   K 4.6 05/29/2017   CL 104 05/29/2017   CREATININE 1.01 05/29/2017   BUN 20 05/29/2017   CO2 26 05/29/2017   HGBA1C 5.4 05/29/2017    --------------------------------------------------------------------------------------------------------------------- No results found.   Assessment & Plan:   Amber was seen today for neck pain.  Diagnoses and all orders for this visit:  Cervical radiculitis -     Cervical Epidural Injection; Future  Cervicalgia  DDD (degenerative disc disease), cervical -     Cervical Epidural Injection; Future  Radiculitis of right cervical region -     Cervical Epidural Injection; Future  Other orders -     sodium chloride flush (NS) 0.9 % injection 10 mL -     ropivacaine (PF) 2 mg/mL (0.2%) (NAROPIN) injection 10 mL -     lidocaine (PF) (XYLOCAINE) 1 % injection 5 mL -     iopamidol (ISOVUE-M) 41 % intrathecal injection 20 mL -     dexamethasone (DECADRON) injection 4 mg -     traMADol (ULTRAM) 50 MG tablet; Take 1 tablet (50 mg total) by mouth every 6 (six) hours as needed for up to 30 days for moderate pain.        ----------------------------------------------------------------------------------------------------------------------  Problem List Items Addressed This Visit    None    Visit Diagnoses    Cervical radiculitis    -  Primary   Relevant Orders   Cervical Epidural Injection   Cervicalgia       DDD (degenerative disc disease), cervical       Relevant Medications    dexamethasone (DECADRON) injection 4 mg   traMADol (ULTRAM) 50 MG tablet   Other Relevant Orders   Cervical Epidural Injection   Radiculitis of right cervical region       Relevant Orders   Cervical Epidural Injection        ----------------------------------------------------------------------------------------------------------------------  1. Cervical radiculitis We will proceed with a repeat epidural injection today.  We have gone over the risks and benefits of the procedure with him in full detail and all questions are answered.  We will do this today with return to clinic in approximately 6 weeks for possible repeat epidural injection at that time.  In the meantime he is to continue with stretching strengthening exercises as previously documented. - Cervical Epidural Injection; Future  2. Cervicalgia As above  3. DDD (degenerative disc disease), cervical As above and we will refill his tramadol today.  Based on his practitioner database information usage is appropriate - Cervical Epidural Injection; Future  4. Radiculitis of right cervical region As above - Cervical Epidural Injection; Future    ----------------------------------------------------------------------------------------------------------------------  I have changed Tayjon Nordlund's traMADol. I am also having him maintain his multivitamin with minerals, vitamin E, aspirin EC, niacin, Fish Oil, fluocinonide cream, triamcinolone cream, b complex vitamins, fenofibrate micronized, pravastatin, Probiotic Product (PROBIOTIC DAILY PO), acetaminophen, naproxen sodium, Melatonin, baclofen, meloxicam, and oseltamivir. We administered sodium chloride flush, ropivacaine (PF) 2 mg/mL (0.2%), lidocaine (PF), and iopamidol.   Meds ordered this encounter  Medications  . sodium chloride flush (NS) 0.9 % injection 10 mL  . ropivacaine (PF) 2 mg/mL (0.2%) (NAROPIN) injection 10 mL  . lidocaine (PF) (XYLOCAINE) 1 % injection  5 mL  . iopamidol (ISOVUE-M) 41 % intrathecal injection 20 mL  . dexamethasone (DECADRON) injection 4 mg  . traMADol (ULTRAM) 50 MG tablet    Sig: Take 1 tablet (50 mg total) by mouth every 6 (six) hours as needed for up to 30 days for moderate pain.    Dispense:  90 tablet    Refill:  1   Patient's Medications  New Prescriptions   No medications on file  Previous Medications   ACETAMINOPHEN (TYLENOL) 500 MG TABLET    Take 500 mg by mouth every 6 (six) hours as needed.   ASPIRIN EC 81 MG TABLET    Take 81 mg by mouth daily.   B COMPLEX VITAMINS CAPSULE    Take 1 capsule by mouth daily.   BACLOFEN (LIORESAL) 10 MG TABLET    TAKE 0.5-1 TABLETS (5-10 MG TOTAL) BY MOUTH 3 (THREE) TIMES DAILY AS NEEDED FOR MUSCLE SPASMS   FENOFIBRATE MICRONIZED (LOFIBRA) 67 MG CAPSULE    Take 1 capsule (67 mg total) by mouth daily before breakfast.   FLUOCINONIDE CREAM (LIDEX) 0.05 %    Apply 1 application topically 2 (two) times daily as needed.   MELATONIN 200 MCG TABS    Take by mouth.   MELOXICAM (MOBIC) 15 MG TABLET    TAKE 1TAB BY MOUTH DAILY AS NEEDED FOR PAIN.TAKE 1-2 WEEKS AT A TIME, MAY HOLD DOSES IF NOT FLARE UP   MULTIPLE VITAMIN (MULTIVITAMIN WITH MINERALS) TABS TABLET    Take 1 tablet by mouth daily.   NAPROXEN SODIUM (ALEVE) 220 MG TABLET    Take 220 mg by mouth.   NIACIN 500 MG TABLET    Take 500 mg by mouth at bedtime.   OMEGA-3 FATTY ACIDS (FISH OIL) 1000 MG CAPS    Take 1,000 mg by mouth daily.   OSELTAMIVIR (TAMIFLU) 75 MG CAPSULE    Take 1 capsule (75 mg total) by mouth daily. For 10 days. If develop symptoms change dosing to twice daily to finish course   PRAVASTATIN (PRAVACHOL) 40 MG TABLET    Take 1 tablet (40 mg total) by mouth daily.   PROBIOTIC PRODUCT (PROBIOTIC DAILY PO)    Take by mouth daily.   TRIAMCINOLONE CREAM (KENALOG) 0.1 %    Apply 1 application topically daily as needed.   VITAMIN E (VITAMIN E) 1000 UNIT CAPSULE    Take 1,000 Units by mouth daily.  Modified Medications    Modified Medication Previous Medication  TRAMADOL (ULTRAM) 50 MG TABLET traMADol (ULTRAM) 50 MG tablet      Take 1 tablet (50 mg total) by mouth every 6 (six) hours as needed for up to 30 days for moderate pain.    Take 1 tablet (50 mg total) by mouth every 6 (six) hours as needed for moderate pain.  Discontinued Medications   No medications on file   ----------------------------------------------------------------------------------------------------------------------  Follow-up: Return in about 6 weeks (around 07/18/2018) for evaluation, procedure.  patient was taken to the fluoroscopy suite and placed in the prone position.. The patient was responsive throughout and vital signs were stable throughout. The area overlying the cervical region was prepped with stereo prep 3 and then 1% lidocaine 2 cc was injected with a 25-gauge needle at the T1-T2 interspace. This was done with strict aseptic technique. I then advanced an 18-gauge Touhy needle using a hanging drop technique and AP and lateral fluoroscopic guidance. There was loss of resistance at approximately 10 cm no paresthesia and clear evidence of epidural placement confirmed with 2 cc of Isovue yielding good epidural spread. Following this I injected a mixture comprised of 10 mg of Decadron 1 cc of normal saline and 1 cc of ropivacaine 0.2%. The needle was withdrawn and the patient tolerated the procedure without difficulty and was convalesced and discharged home for follow-up as mentioned.  Yevette Edwards, MD

## 2018-06-12 DIAGNOSIS — M542 Cervicalgia: Secondary | ICD-10-CM | POA: Diagnosis not present

## 2018-06-17 DIAGNOSIS — M542 Cervicalgia: Secondary | ICD-10-CM | POA: Diagnosis not present

## 2018-06-24 ENCOUNTER — Encounter: Payer: Self-pay | Admitting: Anesthesiology

## 2018-06-24 DIAGNOSIS — R29898 Other symptoms and signs involving the musculoskeletal system: Secondary | ICD-10-CM

## 2018-06-24 DIAGNOSIS — M5412 Radiculopathy, cervical region: Secondary | ICD-10-CM

## 2018-06-24 DIAGNOSIS — M542 Cervicalgia: Secondary | ICD-10-CM

## 2018-07-01 DIAGNOSIS — M542 Cervicalgia: Secondary | ICD-10-CM | POA: Diagnosis not present

## 2018-07-08 DIAGNOSIS — M542 Cervicalgia: Secondary | ICD-10-CM | POA: Diagnosis not present

## 2018-07-11 DIAGNOSIS — M542 Cervicalgia: Secondary | ICD-10-CM | POA: Diagnosis not present

## 2018-07-15 DIAGNOSIS — M542 Cervicalgia: Secondary | ICD-10-CM | POA: Diagnosis not present

## 2018-07-17 DIAGNOSIS — M542 Cervicalgia: Secondary | ICD-10-CM | POA: Diagnosis not present

## 2018-07-22 DIAGNOSIS — M542 Cervicalgia: Secondary | ICD-10-CM | POA: Diagnosis not present

## 2018-07-25 DIAGNOSIS — M542 Cervicalgia: Secondary | ICD-10-CM | POA: Diagnosis not present

## 2018-07-29 DIAGNOSIS — M542 Cervicalgia: Secondary | ICD-10-CM | POA: Diagnosis not present

## 2018-08-01 DIAGNOSIS — M542 Cervicalgia: Secondary | ICD-10-CM | POA: Diagnosis not present

## 2018-08-05 DIAGNOSIS — M542 Cervicalgia: Secondary | ICD-10-CM | POA: Diagnosis not present

## 2018-08-07 ENCOUNTER — Other Ambulatory Visit: Payer: Self-pay | Admitting: Family Medicine

## 2018-08-07 DIAGNOSIS — E785 Hyperlipidemia, unspecified: Secondary | ICD-10-CM

## 2018-08-08 DIAGNOSIS — M542 Cervicalgia: Secondary | ICD-10-CM | POA: Diagnosis not present

## 2018-08-08 MED ORDER — PRAVASTATIN SODIUM 40 MG PO TABS: 40.0000 mg | ORAL_TABLET | Freq: Every day | ORAL | 0 refills | Status: DC

## 2018-08-08 NOTE — Addendum Note (Signed)
Addended by: Smitty Cords on: 08/08/2018 04:43 PM   Modules accepted: Orders

## 2018-08-19 DIAGNOSIS — M542 Cervicalgia: Secondary | ICD-10-CM | POA: Diagnosis not present

## 2018-09-02 DIAGNOSIS — M542 Cervicalgia: Secondary | ICD-10-CM | POA: Diagnosis not present

## 2018-09-16 ENCOUNTER — Other Ambulatory Visit: Payer: Self-pay | Admitting: Family Medicine

## 2018-09-16 DIAGNOSIS — M542 Cervicalgia: Secondary | ICD-10-CM

## 2018-09-16 DIAGNOSIS — G8929 Other chronic pain: Secondary | ICD-10-CM

## 2018-09-16 DIAGNOSIS — E7849 Other hyperlipidemia: Secondary | ICD-10-CM

## 2018-09-23 ENCOUNTER — Encounter: Payer: Self-pay | Admitting: Anesthesiology

## 2018-10-08 ENCOUNTER — Telehealth: Payer: Self-pay

## 2018-10-08 NOTE — Telephone Encounter (Signed)
Mri ordered today

## 2018-10-08 NOTE — Telephone Encounter (Signed)
He wants Dr. Andree Elk to reorder the MRI from back in January. He didn't schedule it then due to insurance.

## 2018-10-13 ENCOUNTER — Telehealth: Payer: Self-pay

## 2018-10-13 NOTE — Telephone Encounter (Signed)
I was able to get authorization for MRI without contrast. Please ask Dr. Andree Elk if this is acceptable, he indicated contrast if necessary.

## 2018-10-14 ENCOUNTER — Telehealth: Payer: Self-pay | Admitting: *Deleted

## 2018-10-14 NOTE — Telephone Encounter (Signed)
Received PA for Tramadol 50 mg  Via covermymeds.  I can't find anything current for this patient.  Last seen on 06/06/18 and does not have a current tramadol Rx.  I did let him know this and ask if anyone else was prescribing for him.  I will have secretary schedule him an appt for pain manangement for med management.

## 2018-10-15 ENCOUNTER — Other Ambulatory Visit: Payer: Self-pay

## 2018-10-15 DIAGNOSIS — M5412 Radiculopathy, cervical region: Secondary | ICD-10-CM

## 2018-10-15 NOTE — Telephone Encounter (Signed)
Please change order to Cervical MRI without contrast per Dr. Andree Elk. Thanks

## 2018-10-16 NOTE — Telephone Encounter (Signed)
Set patient appt for 12-21-18 at 2:15 for med mgmt VV. Lvmail for patient informing him of this and ask him to call and confirm appt.

## 2018-10-20 ENCOUNTER — Ambulatory Visit: Payer: BC Managed Care – PPO | Attending: Anesthesiology | Admitting: Anesthesiology

## 2018-10-20 ENCOUNTER — Other Ambulatory Visit: Payer: Self-pay

## 2018-10-20 ENCOUNTER — Encounter: Payer: Self-pay | Admitting: Anesthesiology

## 2018-10-20 DIAGNOSIS — M5412 Radiculopathy, cervical region: Secondary | ICD-10-CM

## 2018-10-20 DIAGNOSIS — M542 Cervicalgia: Secondary | ICD-10-CM

## 2018-10-20 DIAGNOSIS — M47812 Spondylosis without myelopathy or radiculopathy, cervical region: Secondary | ICD-10-CM | POA: Diagnosis not present

## 2018-10-20 DIAGNOSIS — R29898 Other symptoms and signs involving the musculoskeletal system: Secondary | ICD-10-CM

## 2018-10-20 DIAGNOSIS — M5030 Other cervical disc degeneration, unspecified cervical region: Secondary | ICD-10-CM

## 2018-10-20 MED ORDER — TRAMADOL HCL 50 MG PO TABS: 50.0000 mg | ORAL_TABLET | Freq: Four times a day (QID) | ORAL | 2 refills | Status: AC

## 2018-10-21 NOTE — Progress Notes (Signed)
Virtual Visit via Video Note  I connected with Patriciaann Clan on 10/21/18 at  2:15 PM EDT by a video enabled telemedicine application and verified that I am speaking with the correct person using two identifiers.  Location: Patient: Home Provider: Pain control center   I discussed the limitations of evaluation and management by telemedicine and the availability of in person appointments. The patient expressed understanding and agreed to proceed.  History of Present Illness: I spoke with Crawford over the phone via video voice conferencing and he is continued to complain of neck pain with radiating pain into the right upper extremity.  He is also noticed some tricep weakness on that side.  This has intensified over the past several months and is beyond his baseline pain symptom complex.  No lower extremity dysfunction or bowel or bladder dysfunction is noted.  He is tried physical therapy with minimal relief and has continued to do some of the cervical traction having recently purchased a home machine.  The tramadol does seem to work when he takes this and it does give him some temporizing relief but nothing sustained.    Observations/Objective: Current Outpatient Medications:  .  acetaminophen (TYLENOL) 500 MG tablet, Take 500 mg by mouth every 6 (six) hours as needed., Disp: , Rfl:  .  aspirin EC 81 MG tablet, Take 81 mg by mouth daily., Disp: , Rfl:  .  b complex vitamins capsule, Take 1 capsule by mouth daily., Disp: , Rfl:  .  baclofen (LIORESAL) 10 MG tablet, TAKE 0.5-1 TABLETS (5-10 MG TOTAL) BY MOUTH 3 (THREE) TIMES DAILY AS NEEDED FOR MUSCLE SPASMS, Disp: 60 tablet, Rfl: 2 .  fenofibrate micronized (LOFIBRA) 67 MG capsule, TAKE 1 CAPSULE (67 MG TOTAL) BY MOUTH DAILY BEFORE BREAKFAST., Disp: 90 capsule, Rfl: 3 .  fluocinonide cream (LIDEX) 3.81 %, Apply 1 application topically 2 (two) times daily as needed., Disp: , Rfl:  .  Melatonin 200 MCG TABS, Take by mouth., Disp: , Rfl:  .   meloxicam (MOBIC) 15 MG tablet, TAKE 1TAB BY MOUTH DAILY AS NEEDED FOR PAIN.TAKE 1-2 WEEKS AT A TIME, MAY HOLD DOSES IF NOT FLARE UP, Disp: 90 tablet, Rfl: 1 .  Multiple Vitamin (MULTIVITAMIN WITH MINERALS) TABS tablet, Take 1 tablet by mouth daily., Disp: , Rfl:  .  naproxen sodium (ALEVE) 220 MG tablet, Take 220 mg by mouth., Disp: , Rfl:  .  niacin 500 MG tablet, Take 500 mg by mouth at bedtime., Disp: , Rfl:  .  Omega-3 Fatty Acids (FISH OIL) 1000 MG CAPS, Take 1,000 mg by mouth daily., Disp: , Rfl:  .  pravastatin (PRAVACHOL) 40 MG tablet, Take 1 tablet (40 mg total) by mouth daily., Disp: 90 tablet, Rfl: 0 .  Probiotic Product (PROBIOTIC DAILY PO), Take by mouth daily., Disp: , Rfl:  .  traMADol (ULTRAM) 50 MG tablet, Take 1 tablet (50 mg total) by mouth 4 (four) times daily., Disp: 120 tablet, Rfl: 2 .  triamcinolone cream (KENALOG) 0.1 %, Apply 1 application topically daily as needed., Disp: , Rfl:  .  vitamin E (VITAMIN E) 1000 UNIT capsule, Take 1,000 Units by mouth daily., Disp: , Rfl:   Assessment and Plan: 1. Cervical radicular pain   2. Cervical radiculitis   3. Cervicalgia   4. Upper extremity weakness   5. DDD (degenerative disc disease), cervical   6. Cervical facet syndrome   Based on our discussion today I have encouraged him to fulfill his cervical MRI July  28 and I am going to refer him to see 1 of the [facility] neurosurgeons at the next available date for further consideration of surgical decompression.  I have encouraged him also to continue with his cervical traction and I am refilling his tramadol today with scheduled return for evaluation in 1 month.   Follow Up Instructions:    I discussed the assessment and treatment plan with the patient. The patient was provided an opportunity to ask questions and all were answered. The patient agreed with the plan and demonstrated an understanding of the instructions.   The patient was advised to call back or seek an in-person  evaluation if the symptoms worsen or if the condition fails to improve as anticipated.  I provided 30 minutes of non-face-to-face time during this encounter.   Yevette EdwardsJames G Saksham Akkerman, MD

## 2018-11-01 ENCOUNTER — Other Ambulatory Visit: Payer: Self-pay | Admitting: Family Medicine

## 2018-11-01 DIAGNOSIS — E785 Hyperlipidemia, unspecified: Secondary | ICD-10-CM

## 2018-11-04 ENCOUNTER — Ambulatory Visit
Admission: RE | Admit: 2018-11-04 | Discharge: 2018-11-04 | Disposition: A | Discharge status: Home / Self Care | Payer: BC Managed Care – PPO | Source: Ambulatory Visit | Attending: Anesthesiology | Admitting: Anesthesiology

## 2018-11-04 ENCOUNTER — Other Ambulatory Visit: Payer: Self-pay

## 2018-11-04 DIAGNOSIS — M5412 Radiculopathy, cervical region: Secondary | ICD-10-CM | POA: Diagnosis not present

## 2018-11-04 DIAGNOSIS — M4802 Spinal stenosis, cervical region: Secondary | ICD-10-CM | POA: Diagnosis not present

## 2018-11-13 ENCOUNTER — Other Ambulatory Visit: Payer: Self-pay

## 2018-11-13 ENCOUNTER — Encounter: Payer: Self-pay | Admitting: Family Medicine

## 2018-11-13 ENCOUNTER — Ambulatory Visit (INDEPENDENT_AMBULATORY_CARE_PROVIDER_SITE_OTHER): Payer: BC Managed Care – PPO | Admitting: Family Medicine

## 2018-11-13 VITALS — BP 126/80 | HR 96 | Temp 98.9°F | Resp 16 | Ht 69.0 in | Wt 241.8 lb

## 2018-11-13 DIAGNOSIS — L732 Hidradenitis suppurativa: Secondary | ICD-10-CM

## 2018-11-13 DIAGNOSIS — G8929 Other chronic pain: Secondary | ICD-10-CM | POA: Diagnosis not present

## 2018-11-13 DIAGNOSIS — M542 Cervicalgia: Secondary | ICD-10-CM

## 2018-11-13 LAB — CBC WITH DIFFERENTIAL/PLATELET
Absolute Monocytes: 696 cells/uL (ref 200–950)
Basophils Absolute: 59 cells/uL (ref 0–200)
Basophils Relative: 0.5 %
Eosinophils Absolute: 401 cells/uL (ref 15–500)
Eosinophils Relative: 3.4 %
HCT: 48.4 % (ref 38.5–50.0)
Hemoglobin: 16.3 g/dL (ref 13.2–17.1)
Lymphs Abs: 1923 cells/uL (ref 850–3900)
MCH: 29.2 pg (ref 27.0–33.0)
MCHC: 33.7 g/dL (ref 32.0–36.0)
MCV: 86.7 fL (ref 80.0–100.0)
MPV: 10.3 fL (ref 7.5–12.5)
Monocytes Relative: 5.9 %
Neutro Abs: 8720 cells/uL — ABNORMAL HIGH (ref 1500–7800)
Neutrophils Relative %: 73.9 %
Platelets: 308 10*3/uL (ref 140–400)
RBC: 5.58 10*6/uL (ref 4.20–5.80)
RDW: 12.2 % (ref 11.0–15.0)
Total Lymphocyte: 16.3 %
WBC: 11.8 10*3/uL — ABNORMAL HIGH (ref 3.8–10.8)

## 2018-11-13 MED ORDER — DOXYCYCLINE HYCLATE 100 MG PO TABS: 100.0000 mg | ORAL_TABLET | Freq: Two times a day (BID) | ORAL | 0 refills | Status: DC

## 2018-11-13 MED ORDER — MELOXICAM 15 MG PO TABS: 15.0000 mg | ORAL_TABLET | Freq: Every day | ORAL | 1 refills | Status: DC

## 2018-11-13 NOTE — Progress Notes (Signed)
Subjective:    Patient ID: Isaiah Johnson, male    DOB: 07-01-1965, 53 y.o.   MRN: [id number]  Isaiah Johnson is a 53 y.o. male presenting on 11/13/2018 for armpit cyst   HPI   BILATERAL HIDRADENITIS vs Lymphadenopathy Axillary Reports new problem identified 4-5 weeks ago with some "irritation" of skin in armpits, thought it was due to deodorant only or something else, and it was mild. He monitored it without problem, until 3-5 days ago developed some swollen "knots" or "cysts" in armpits both sides multiple areas. He says sore to the touch, deeper spots, without significant redness or skin changes. No skin sore or drainage. He has not been on antibiotics lately. He has not had similar problem under arms before. - He does admit to having a chronic dental infection >1 year, has apt tomorrow with dentist for procedure and possible localized antibiotic therapy - he had prior skin tag on L side treated in past   Denies any fevers chills sweats, night sweats, unintentional weight loss, nausea vomiting, spreading redness other swollen lymph nodes or lumps/bumps   [facility] 11/13/2018 06/06/2018 03/18/2018  Decreased Interest 0 0 0  Down, Depressed, Hopeless 0 0 0  PHQ - 2 Score 0 0 0    Social History   Tobacco Use  . Smoking status: Never Smoker  . Smokeless tobacco: Never Used  Substance Use Topics  . Alcohol use: Yes    Comment: 1 drink  . Drug use: No    Review of Systems Per HPI unless specifically indicated above     Objective:    BP 126/80   Pulse 96   Temp 98.9 F (37.2 C) (Oral)   Resp 16   Ht 5\' 9"  (1.753 m)   Wt 241 lb 12.8 oz (109.7 kg)   BMI 35.71 kg/m   Wt Readings from Last 3 Encounters:  11/13/18 241 lb 12.8 oz (109.7 kg)  06/06/18 230 lb (104.3 kg)  04/15/18 225 lb (102.1 kg)    Physical Exam Vitals signs and nursing note reviewed.  Constitutional:      General: He is not in acute distress.    Appearance: He is well-developed. He is  not diaphoretic.     Comments: Well-appearing, comfortable, cooperative  HENT:     Head: Normocephalic and atraumatic.  Eyes:     General:        Right eye: No discharge.        Left eye: No discharge.     Conjunctiva/sclera: Conjunctivae normal.  Cardiovascular:     Rate and Rhythm: Normal rate.  Pulmonary:     Effort: Pulmonary effort is normal.  Lymphadenopathy:     Cervical: No cervical adenopathy.     Upper Body:     Right upper body: Axillary adenopathy present. No supraclavicular adenopathy.     Left upper body: Axillary adenopathy present. No supraclavicular adenopathy.     Comments: Bilateral axillary with palpable distinct 1-2 main nodular soft mobile slightly tender structures. Seems well formed and about 1 cm or less in size could be lymph nodes. No significant surrounding induration or erythema. No ulceration. No drainage. L>R  Skin:    General: Skin is warm and dry.     Findings: No erythema or rash.  Neurological:     Mental Status: He is alert and oriented to person, place, and time.  Psychiatric:        Behavior: Behavior normal.     Comments: Well  groomed, good eye contact, normal speech and thoughts    Results for orders placed or performed in visit on 11/14/17  HM COLONOSCOPY  Result Value Ref Range   HM Colonoscopy See Report (in chart) See Report (in chart), Patient Reported      Assessment & Plan:   Problem List Items Addressed This Visit    Chronic neck pain   Relevant Medications   meloxicam (MOBIC) 15 MG tablet  Followed by Dr Andree Elk, had MRI recently No longer on NSAID meloxicam Restart for pain under arms, see A&P    Other Visit Diagnoses    Left axillary hidradenitis    -  Primary   Relevant Medications   doxycycline (VIBRA-TABS) 100 MG tablet   Other Relevant Orders   CBC with Differential/Platelet   Right axillary hidradenitis       Relevant Medications   doxycycline (VIBRA-TABS) 100 MG tablet   Other Relevant Orders   CBC with  Differential/Platelet      Consistent with new problem bilateral axillary lymphadenopathy vs hidradenitis seems acute within few days, but some early symptoms >4 weeks No history of recurrent abcesses in this or other areas, not consistent with chronic suppurativa hidradenitis. No recent antibiotics. Identified dental infection (chronic) may be cause of reactive LAD  Plan: Check CBC  1. Start empiric trial antibiotic Doxycycline 100 BID x 10 days - meloxicam PRN for pain inflammation 2. F/u with Dentist for further treatment of dental infection 3. Monitor progress of swollen areas in axillary, may use warm compress 4. Return precautions given if worsening  Concerning for LAD, given only short duration present for few days, will monitor up to 3-4 weeks total, if source resolves and LAD resolves, reassurance given. If unresolved then will need further diagnostic, with Ultrasound and or referral to Gen Surgery for eval and possible biopsy.   Meds ordered this encounter  Medications  . doxycycline (VIBRA-TABS) 100 MG tablet    Sig: Take 1 tablet (100 mg total) by mouth 2 (two) times daily. For 10 days. Take with full glass of water, stay upright 30 min after taking.    Dispense:  20 tablet    Refill:  0  . meloxicam (MOBIC) 15 MG tablet    Sig: Take 1 tablet (15 mg total) by mouth daily. As needed up to 1-2 weeks at a time    Dispense:  90 tablet    Refill:  1      Follow up plan: Return in about 4 weeks (around 12/11/2018), or if symptoms worsen or fail to improve, for lymph nodes vs cyst.   [name], DO [facility] 11/13/2018, 9:57 AM

## 2018-11-13 NOTE — Patient Instructions (Addendum)
Thank you for coming to the office today.  You have several swollen spots within arm pits that can be either infected swollen cyst structures, caused by sweat glands. Or they could be lymph nodes. Difficult to tell at this time. I am concerned they may be lymph nodes and warrant further testing. But we will start with treating them with antibiotic.  Check blood test today for blood counts. To eval for possible infection or blood cell disorder  As discussed today, it did not seem like it would require drainage in office.  Start taking antibiotic as prescribed, Doxycycline 100mg  twice daily for 10 days, make sure to take with full glass of water and stay upright or seated for 30 min (do not lay down after taking or can cause burning irritation of throat).  Recommend warm soapy water soaks in bathtub or with warm compresses/wash cloth several times a day to help heal and may actually drain some pus on its own, which can be normal.   You may need to return soon for re-evaluation if worsening such as spreading redness or streaking redness, significantly larger size, increased pain, fevers/chills, nausea vomiting and cannot take antibiotic. If significantly worse symptoms or most of these symptoms, would recommend going straight to Hospital Emergency Dept as you may require IV antibiotics instead.  If these spots do NOT resolve by 3-4 weeks from now - then we would strongly recommend referral to a General Surgeon for evaluation and may consider a needle biopsy or other imaging test.  Keep track of dental infection as well. Maybe hold doxycycline for 1 day until you ask dentist.   Please schedule a Follow-up Appointment to: Return in about 4 weeks (around 12/11/2018), or if symptoms worsen or fail to improve, for lymph nodes vs cyst.  If you have any other questions or concerns, please feel free to call the office or send a message through [facility]. You may also schedule an earlier appointment if  necessary.  Additionally, you may be receiving a survey about your experience at our office within a few days to 1 week by e-mail or mail. We value your feedback.  [name], DO [facility]

## 2018-11-18 ENCOUNTER — Encounter: Payer: Self-pay | Admitting: Anesthesiology

## 2018-11-20 DIAGNOSIS — M25511 Pain in right shoulder: Secondary | ICD-10-CM | POA: Diagnosis not present

## 2018-11-20 DIAGNOSIS — M5412 Radiculopathy, cervical region: Secondary | ICD-10-CM | POA: Diagnosis not present

## 2018-11-20 DIAGNOSIS — G8929 Other chronic pain: Secondary | ICD-10-CM | POA: Diagnosis not present

## 2018-12-03 DIAGNOSIS — G8929 Other chronic pain: Secondary | ICD-10-CM | POA: Diagnosis not present

## 2018-12-03 DIAGNOSIS — M47812 Spondylosis without myelopathy or radiculopathy, cervical region: Secondary | ICD-10-CM | POA: Diagnosis not present

## 2018-12-03 DIAGNOSIS — M19011 Primary osteoarthritis, right shoulder: Secondary | ICD-10-CM | POA: Diagnosis not present

## 2018-12-03 DIAGNOSIS — M25511 Pain in right shoulder: Secondary | ICD-10-CM | POA: Diagnosis not present

## 2018-12-03 DIAGNOSIS — M7541 Impingement syndrome of right shoulder: Secondary | ICD-10-CM | POA: Diagnosis not present

## 2018-12-10 DIAGNOSIS — R590 Localized enlarged lymph nodes: Secondary | ICD-10-CM

## 2018-12-10 DIAGNOSIS — L732 Hidradenitis suppurativa: Secondary | ICD-10-CM

## 2018-12-10 NOTE — Addendum Note (Signed)
Addended by: Olin Hauser on: 12/10/2018 02:08 PM   Modules accepted: Orders

## 2018-12-14 ENCOUNTER — Other Ambulatory Visit: Payer: Self-pay | Admitting: Family Medicine

## 2018-12-14 DIAGNOSIS — G8929 Other chronic pain: Secondary | ICD-10-CM

## 2018-12-23 ENCOUNTER — Ambulatory Visit: Payer: BC Managed Care – PPO | Admitting: General Surgery

## 2018-12-25 ENCOUNTER — Ambulatory Visit: Payer: BC Managed Care – PPO | Admitting: General Surgery

## 2019-01-15 DIAGNOSIS — L0231 Cutaneous abscess of buttock: Secondary | ICD-10-CM | POA: Diagnosis not present

## 2019-01-22 ENCOUNTER — Ambulatory Visit: Payer: BC Managed Care – PPO | Admitting: General Surgery

## 2019-02-17 DIAGNOSIS — K611 Rectal abscess: Secondary | ICD-10-CM | POA: Diagnosis not present

## 2019-03-03 DIAGNOSIS — L280 Lichen simplex chronicus: Secondary | ICD-10-CM | POA: Diagnosis not present

## 2019-03-03 DIAGNOSIS — D2262 Melanocytic nevi of left upper limb, including shoulder: Secondary | ICD-10-CM | POA: Diagnosis not present

## 2019-03-03 DIAGNOSIS — D2261 Melanocytic nevi of right upper limb, including shoulder: Secondary | ICD-10-CM | POA: Diagnosis not present

## 2019-03-03 DIAGNOSIS — D225 Melanocytic nevi of trunk: Secondary | ICD-10-CM | POA: Diagnosis not present

## 2019-03-11 DIAGNOSIS — M542 Cervicalgia: Secondary | ICD-10-CM | POA: Diagnosis not present

## 2019-03-11 DIAGNOSIS — R200 Anesthesia of skin: Secondary | ICD-10-CM | POA: Diagnosis not present

## 2019-03-18 DIAGNOSIS — H6123 Impacted cerumen, bilateral: Secondary | ICD-10-CM | POA: Diagnosis not present

## 2019-03-18 DIAGNOSIS — H903 Sensorineural hearing loss, bilateral: Secondary | ICD-10-CM | POA: Diagnosis not present

## 2019-03-18 DIAGNOSIS — H93293 Other abnormal auditory perceptions, bilateral: Secondary | ICD-10-CM | POA: Diagnosis not present

## 2019-05-03 ENCOUNTER — Other Ambulatory Visit: Payer: Self-pay | Admitting: Family Medicine

## 2019-05-03 ENCOUNTER — Other Ambulatory Visit: Payer: Self-pay | Admitting: Anesthesiology

## 2019-05-03 DIAGNOSIS — E785 Hyperlipidemia, unspecified: Secondary | ICD-10-CM

## 2019-05-13 ENCOUNTER — Encounter: Payer: Self-pay | Admitting: Anesthesiology

## 2019-05-13 ENCOUNTER — Other Ambulatory Visit: Payer: Self-pay

## 2019-05-13 ENCOUNTER — Ambulatory Visit: Payer: BC Managed Care – PPO | Attending: Anesthesiology | Admitting: Anesthesiology

## 2019-05-13 DIAGNOSIS — M4722 Other spondylosis with radiculopathy, cervical region: Secondary | ICD-10-CM | POA: Diagnosis not present

## 2019-05-13 DIAGNOSIS — M5030 Other cervical disc degeneration, unspecified cervical region: Secondary | ICD-10-CM

## 2019-05-13 DIAGNOSIS — M542 Cervicalgia: Secondary | ICD-10-CM | POA: Diagnosis not present

## 2019-05-13 DIAGNOSIS — R29898 Other symptoms and signs involving the musculoskeletal system: Secondary | ICD-10-CM

## 2019-05-13 DIAGNOSIS — M5412 Radiculopathy, cervical region: Secondary | ICD-10-CM

## 2019-05-13 DIAGNOSIS — M47812 Spondylosis without myelopathy or radiculopathy, cervical region: Secondary | ICD-10-CM

## 2019-05-13 MED ORDER — TRAMADOL HCL 50 MG PO TABS: 50.0000 mg | ORAL_TABLET | Freq: Three times a day (TID) | ORAL | 2 refills | Status: AC

## 2019-05-14 NOTE — Progress Notes (Signed)
Virtual Visit via Video Note  I connected with Isaiah Johnson on 05/14/19 at  1:00 PM EST by a video enabled telemedicine application and verified that I am speaking with the correct person using two identifiers.  Location: Patient: Home Provider: Pain control center   I discussed the limitations of evaluation and management by telemedicine and the availability of in person appointments. The patient expressed understanding and agreed to proceed.  History of Present Illness: I spoke with Isaiah Johnson via video conferencing for his virtual appointment.  He has had a considerable amount of neck pain similar to what he has been experiencing and is currently scheduled for decompressive surgery and 3 level fusion here in the next month.  He reports that the pain medications help considerably and he would like to continue on these continue on these.  He denies any side effects and based on our conversation continues to derive good functional lifestyle provement with the medicines and no untoward side effects are reported.  He continues to have to have some weakness primarily left upper extremity similar to what he has previously reported.  Otherwise his condition has been stable.    Observations/Objective:  Current Outpatient Medications:  .  acetaminophen (TYLENOL) 500 MG tablet, Take 500 mg by mouth every 6 (six) hours as needed., Disp: , Rfl:  .  aspirin EC 81 MG tablet, Take 81 mg by mouth daily., Disp: , Rfl:  .  augmented betamethasone dipropionate (DIPROLENE-AF) 0.05 % cream, , Disp: , Rfl:  .  b complex vitamins capsule, Take 1 capsule by mouth daily., Disp: , Rfl:  .  baclofen (LIORESAL) 10 MG tablet, TAKE 0.5-1 TABLETS (5-10 MG TOTAL) BY MOUTH 3 (THREE) TIMES DAILY AS NEEDED FOR MUSCLE SPASMS, Disp: 60 tablet, Rfl: 2 .  doxycycline (VIBRA-TABS) 100 MG tablet, Take 1 tablet (100 mg total) by mouth 2 (two) times daily. For 10 days. Take with full glass of water, stay upright 30 min after  taking., Disp: 20 tablet, Rfl: 0 .  fenofibrate micronized (LOFIBRA) 67 MG capsule, TAKE 1 CAPSULE (67 MG TOTAL) BY MOUTH DAILY BEFORE BREAKFAST., Disp: 90 capsule, Rfl: 3 .  fluocinonide cream (LIDEX) 6.65 %, Apply 1 application topically 2 (two) times daily as needed., Disp: , Rfl:  .  Melatonin 200 MCG TABS, Take by mouth., Disp: , Rfl:  .  meloxicam (MOBIC) 15 MG tablet, Take 1 tablet (15 mg total) by mouth daily. As needed up to 1-2 weeks at a time, Disp: 90 tablet, Rfl: 1 .  Multiple Vitamin (MULTIVITAMIN WITH MINERALS) TABS tablet, Take 1 tablet by mouth daily., Disp: , Rfl:  .  niacin 500 MG tablet, Take 500 mg by mouth at bedtime., Disp: , Rfl:  .  Omega-3 Fatty Acids (FISH OIL) 1000 MG CAPS, Take 1,000 mg by mouth daily., Disp: , Rfl:  .  pravastatin (PRAVACHOL) 40 MG tablet, TAKE 1 TABLET BY MOUTH EVERY DAY, Disp: 90 tablet, Rfl: 1 .  Probiotic Product (PROBIOTIC DAILY PO), Take by mouth daily., Disp: , Rfl:  .  traMADol (ULTRAM) 50 MG tablet, Take 1 tablet (50 mg total) by mouth 3 (three) times daily., Disp: 90 tablet, Rfl: 2 .  triamcinolone cream (KENALOG) 0.1 %, Apply 1 application topically daily as needed., Disp: , Rfl:  .  vitamin E (VITAMIN E) 1000 UNIT capsule, Take 1,000 Units by mouth daily., Disp: , Rfl:   Assessment and Plan: 1. Cervical radicular pain   2. Cervical radiculitis   3. Cervicalgia  4. Upper extremity weakness   5. DDD (degenerative disc disease), cervical   6. Cervical facet syndrome   7. Radiculitis of right cervical region   Based on discussion today upon review of the [facility] practitioner database information we will refill his tramadol as scheduled.  As noted he is having surgery within the next month for his neck fusion.  I have instructed him to contact us if he should have any questions about the anesthesia or pain management in the perioperative period.  I want him to continue with the stretching exercises as tolerated we did talk about some  options regarding sleep positioning and orthotic pillow neck pillows.  Also want him to continue with walking exercises and we will schedule him for return to clinic approximately 2 to 3 months.  Follow Up Instructions:    I discussed the assessment and treatment plan with the patient. The patient was provided an opportunity to ask questions and all were answered. The patient agreed with the plan and demonstrated an understanding of the instructions.   The patient was advised to call back or seek an in-person evaluation if the symptoms worsen or if the condition fails to improve as anticipated.  I provided 30 minutes of non-face-to-face time during this encounter.   Yevette Edwards, MD

## 2019-06-01 DIAGNOSIS — Z01818 Encounter for other preprocedural examination: Secondary | ICD-10-CM | POA: Diagnosis not present

## 2019-06-03 DIAGNOSIS — Z01818 Encounter for other preprocedural examination: Secondary | ICD-10-CM | POA: Diagnosis not present

## 2019-06-05 DIAGNOSIS — L409 Psoriasis, unspecified: Secondary | ICD-10-CM | POA: Diagnosis not present

## 2019-06-05 DIAGNOSIS — M4802 Spinal stenosis, cervical region: Secondary | ICD-10-CM | POA: Diagnosis not present

## 2019-06-05 DIAGNOSIS — Z79899 Other long term (current) drug therapy: Secondary | ICD-10-CM | POA: Diagnosis not present

## 2019-06-05 DIAGNOSIS — M5412 Radiculopathy, cervical region: Secondary | ICD-10-CM | POA: Diagnosis not present

## 2019-06-05 DIAGNOSIS — E785 Hyperlipidemia, unspecified: Secondary | ICD-10-CM | POA: Diagnosis not present

## 2019-06-05 DIAGNOSIS — Z7982 Long term (current) use of aspirin: Secondary | ICD-10-CM | POA: Diagnosis not present

## 2019-06-05 DIAGNOSIS — Z981 Arthrodesis status: Secondary | ICD-10-CM | POA: Diagnosis not present

## 2019-07-16 DIAGNOSIS — M5031 Other cervical disc degeneration,  high cervical region: Secondary | ICD-10-CM | POA: Diagnosis not present

## 2019-07-16 DIAGNOSIS — M5412 Radiculopathy, cervical region: Secondary | ICD-10-CM | POA: Diagnosis not present

## 2019-07-21 ENCOUNTER — Other Ambulatory Visit: Payer: Self-pay | Admitting: Family Medicine

## 2019-07-21 DIAGNOSIS — E7849 Other hyperlipidemia: Secondary | ICD-10-CM

## 2019-08-07 ENCOUNTER — Other Ambulatory Visit: Payer: Self-pay | Admitting: Family Medicine

## 2019-08-07 DIAGNOSIS — G8929 Other chronic pain: Secondary | ICD-10-CM

## 2019-08-07 DIAGNOSIS — M542 Cervicalgia: Secondary | ICD-10-CM

## 2019-08-07 NOTE — Telephone Encounter (Signed)
   Requested medications are on the active medication list yes  Last refill 2/2   Notes to clinic Instructions say "as needed up to 1-2 weeks. Picked up 90 tabs 90 days ago.

## 2019-08-19 DIAGNOSIS — D2262 Melanocytic nevi of left upper limb, including shoulder: Secondary | ICD-10-CM | POA: Diagnosis not present

## 2019-08-19 DIAGNOSIS — D2272 Melanocytic nevi of left lower limb, including hip: Secondary | ICD-10-CM | POA: Diagnosis not present

## 2019-08-19 DIAGNOSIS — D225 Melanocytic nevi of trunk: Secondary | ICD-10-CM | POA: Diagnosis not present

## 2019-08-19 DIAGNOSIS — D2261 Melanocytic nevi of right upper limb, including shoulder: Secondary | ICD-10-CM | POA: Diagnosis not present

## 2019-08-27 DIAGNOSIS — M4322 Fusion of spine, cervical region: Secondary | ICD-10-CM | POA: Diagnosis not present

## 2019-08-27 DIAGNOSIS — Z981 Arthrodesis status: Secondary | ICD-10-CM | POA: Diagnosis not present

## 2019-10-06 ENCOUNTER — Ambulatory Visit: Payer: BC Managed Care – PPO | Attending: Anesthesiology | Admitting: Anesthesiology

## 2019-10-06 ENCOUNTER — Other Ambulatory Visit: Payer: Self-pay

## 2019-10-06 ENCOUNTER — Encounter: Payer: Self-pay | Admitting: Anesthesiology

## 2019-10-06 VITALS — BP 147/102 | HR 83 | Temp 97.4°F | Ht 69.0 in | Wt 230.0 lb

## 2019-10-06 DIAGNOSIS — M5030 Other cervical disc degeneration, unspecified cervical region: Secondary | ICD-10-CM | POA: Diagnosis not present

## 2019-10-06 DIAGNOSIS — G8929 Other chronic pain: Secondary | ICD-10-CM

## 2019-10-06 DIAGNOSIS — M542 Cervicalgia: Secondary | ICD-10-CM

## 2019-10-06 DIAGNOSIS — M5412 Radiculopathy, cervical region: Secondary | ICD-10-CM

## 2019-10-06 DIAGNOSIS — M47812 Spondylosis without myelopathy or radiculopathy, cervical region: Secondary | ICD-10-CM

## 2019-10-06 MED ORDER — GABAPENTIN 300 MG PO CAPS: 300.0000 mg | ORAL_CAPSULE | Freq: Two times a day (BID) | ORAL | 2 refills | Status: DC

## 2019-10-06 MED ORDER — MELOXICAM 15 MG PO TABS: 15.0000 mg | ORAL_TABLET | Freq: Every day | ORAL | 1 refills | Status: DC

## 2019-10-06 MED ORDER — CYCLOBENZAPRINE HCL 10 MG PO TABS: 10.0000 mg | ORAL_TABLET | Freq: Every day | ORAL | 3 refills | Status: DC

## 2019-10-06 MED ORDER — TRAMADOL HCL 50 MG PO TABS: 100.0000 mg | ORAL_TABLET | Freq: Two times a day (BID) | ORAL | 2 refills | Status: AC

## 2019-10-06 NOTE — Progress Notes (Signed)
Subjective:  Patient ID: Isaiah DesanctisDaniel Conkle, male    DOB: 03-16-1966  Age: 54 y.o. MRN: [id number]  CC: Shoulder Pain   Procedure: None  HPI Isaiah DesanctisDaniel Paolo presents for reevaluation.  He is now status post neck surgery 4 months.  He had a 3 level cervical fusion.  This was with Dr. Marcell Barlow[place].  He reports that his right arm weakness has resolved.  He still has occasional cervical pain with some periodic radiation into the left posterior trapezius and left mid deltoid region.  No weakness is reported.  No numbness or tingling is reported.  This is worse with certain activity and he has held off with weight lifting up to this point.  He is no longer taking any of his medications other than the tramadol.  Otherwise he reports that he is doing well.  Outpatient Medications Prior to Visit  Medication Sig Dispense Refill  . acetaminophen (TYLENOL) 500 MG tablet Take 500 mg by mouth every 6 (six) hours as needed.    Marland Kitchen. aspirin EC 81 MG tablet Take 81 mg by mouth daily.    Marland Kitchen. augmented betamethasone dipropionate (DIPROLENE-AF) 0.05 % cream     . b complex vitamins capsule Take 1 capsule by mouth daily.    Marland Kitchen. doxycycline (VIBRA-TABS) 100 MG tablet Take 1 tablet (100 mg total) by mouth 2 (two) times daily. For 10 days. Take with full glass of water, stay upright 30 min after taking. 20 tablet 0  . fenofibrate micronized (LOFIBRA) 67 MG capsule TAKE 1 CAPSULE (67 MG TOTAL) BY MOUTH DAILY BEFORE BREAKFAST. 90 capsule 3  . fluocinonide cream (LIDEX) 0.05 % Apply 1 application topically 2 (two) times daily as needed.    . Melatonin 200 MCG TABS Take by mouth.    . Multiple Vitamin (MULTIVITAMIN WITH MINERALS) TABS tablet Take 1 tablet by mouth daily.    . niacin 500 MG tablet Take 500 mg by mouth at bedtime.    . Omega-3 Fatty Acids (FISH OIL) 1000 MG CAPS Take 1,000 mg by mouth daily.    . pravastatin (PRAVACHOL) 40 MG tablet TAKE 1 TABLET BY MOUTH EVERY DAY 90 tablet 1  . Probiotic Product (PROBIOTIC DAILY  PO) Take by mouth daily.    Marland Kitchen. triamcinolone cream (KENALOG) 0.1 % Apply 1 application topically daily as needed.    . vitamin E (VITAMIN E) 1000 UNIT capsule Take 1,000 Units by mouth daily.    . baclofen (LIORESAL) 10 MG tablet TAKE 0.5-1 TABLETS (5-10 MG TOTAL) BY MOUTH 3 (THREE) TIMES DAILY AS NEEDED FOR MUSCLE SPASMS (Patient not taking: Reported on 10/06/2019) 60 tablet 2  . meloxicam (MOBIC) 15 MG tablet Take 1 tablet (15 mg total) by mouth daily. As needed up to 1-2 weeks at a time (Patient not taking: Reported on 10/06/2019) 90 tablet 1   No facility-administered medications prior to visit.    Review of Systems CNS: No confusion or sedation Cardiac: No angina or palpitations GI: No abdominal pain or constipation Constitutional: No nausea vomiting fevers or chills  Objective:  BP (!) 147/102   Pulse 83   Temp (!) 97.4 F (36.3 C)   Ht 5\' 9"  (1.753 m)   Wt 230 lb (104.3 kg)   SpO2 99%   BMI 33.97 kg/m    BP Readings from Last 3 Encounters:  10/06/19 (!) 147/102  11/13/18 126/80  06/06/18 (!) 126/94     Wt Readings from Last 3 Encounters:  10/06/19 230 lb (104.3 kg)  11/13/18 241 lb 12.8 oz (109.7 kg)  06/06/18 230 lb (104.3 kg)     Physical Exam Pt is alert and oriented PERRL EOMI HEART IS RRR no murmur or rub LCTA no wheezing or rales MUSCULOSKELETAL reveals a trigger point in the mid body left trapezius muscle.  He has good range of motion at the neck with slightly greater lateral rotation to the right than left but well preserved flexion extension at the atlantooccipital joint.  His muscle tone and bulk is good.  His strength is good in the upper extremities.  Labs  Lab Results  Component Value Date   HGBA1C 5.4 05/29/2017   Lab Results  Component Value Date   LDLCALC 134 (H) 05/29/2017   CREATININE 1.01 05/29/2017    -------------------------------------------------------------------------------------------------------------------- Lab Results   Component Value Date   WBC 11.8 (H) 11/13/2018   HGB 16.3 11/13/2018   HCT 48.4 11/13/2018   PLT 308 11/13/2018   GLUCOSE 88 05/29/2017   CHOL 199 05/29/2017   TRIG 145 05/29/2017   HDL 38 (L) 05/29/2017   LDLCALC 134 (H) 05/29/2017   ALT 25 05/29/2017   AST 20 05/29/2017   NA 139 05/29/2017   K 4.6 05/29/2017   CL 104 05/29/2017   CREATININE 1.01 05/29/2017   BUN 20 05/29/2017   CO2 26 05/29/2017   HGBA1C 5.4 05/29/2017    --------------------------------------------------------------------------------------------------------------------- MR CERVICAL SPINE WO CONTRAST  Result Date: 11/04/2018 CLINICAL DATA:  Initial evaluation for chronic neck and bilateral arm pain, worse on the left. EXAM: MRI CERVICAL SPINE WITHOUT CONTRAST TECHNIQUE: Multiplanar, multisequence MR imaging of the cervical spine was performed. No intravenous contrast was administered. COMPARISON:  None available. FINDINGS: Alignment: Straightening with mild reversal of the normal cervical lordosis. Trace anterolisthesis of C2 on C3, likely chronic and facet mediated. Vertebrae: Vertebral body height maintained without evidence for acute or chronic fracture. Bone marrow signal intensity within normal limits. No discrete or worrisome osseous lesions. Mild reactive endplate changes present at C3-4 through C6-7. No other abnormal marrow edema. Cord: Signal intensity within the cervical spinal cord is normal. Posterior Fossa, vertebral arteries, paraspinal tissues: Cerebellar tonsils minimally low lying by approximately 2-3 mm at the foramen magnum without frank Chiari malformation. Craniocervical junction otherwise unremarkable. Paraspinous and prevertebral soft tissues within normal limits. Normal intravascular flow voids seen within the vertebral arteries bilaterally. Disc levels: C2-C3: Trace anterolisthesis. Minimal disc bulge with bilateral uncovertebral hypertrophy. Advanced left with mild right facet degeneration.  Central canal remains widely patent. Moderate left C3 foraminal stenosis. No significant right foraminal narrowing. C3-C4: Chronic intervertebral disc space narrowing with diffuse degenerative disc osteophyte. Broad posterior component flattens and partially faces the ventral thecal sac and results in mild spinal stenosis. Minimal flattening of the ventral spinal cord without cord signal changes. Superimposed left greater than right facet hypertrophy. Moderate left with mild right C4 foraminal stenosis. C4-C5: Chronic intervertebral disc space narrowing with diffuse degenerative disc osteophyte. Flattening and partial effacement of the ventral thecal sac, greater on the right. Mild spinal stenosis with minimal flattening of the ventral right hemicord. No cord signal changes. Superimposed mild facet hypertrophy. Severe left with moderate right C5 foraminal narrowing. C5-C6: Chronic intervertebral disc space narrowing with diffuse degenerative disc osteophyte. Broad posterior component indents the ventral thecal sac resultant mild spinal stenosis. Minimal flattening of the ventral spinal cord without cord signal changes. Right worse than left uncovertebral hypertrophy with resultant severe right and moderate left C6 foraminal narrowing. C6-C7: Chronic intervertebral disc space narrowing  with diffuse degenerative disc osteophyte. Broad posterior component flattens and partially faces the ventral thecal sac resultant mild spinal stenosis. Superimposed mild facet and ligament flavum hypertrophy. Moderate bilateral C7 foraminal stenosis. C7-T1:  Mild facet hypertrophy.  No canal or foraminal stenosis. Visualized upper thoracic spine demonstrates no significant finding. IMPRESSION: 1. Moderate multilevel cervical spondylolysis with resultant mild diffuse spinal stenosis at C3-4 through C6-7. 2. Multifactorial degenerative changes with resultant multilevel foraminal narrowing as above. Notable findings include moderate left  C3 and C4 foraminal stenosis, severe left with moderate right C5 foraminal narrowing, severe right with moderate left C6 foraminal stenosis, with moderate bilateral C7 foraminal narrowing. Electronically Signed   By: Rise Mu M.D.   On: 11/04/2018 20:16     Assessment & Plan:   Nicholas was seen today for shoulder pain.  Diagnoses and all orders for this visit:  Cervical radicular pain  Cervicalgia  DDD (degenerative disc disease), cervical  Cervical facet syndrome  Cervical radiculitis  Chronic neck pain -     meloxicam (MOBIC) 15 MG tablet; Take 1 tablet (15 mg total) by mouth daily. As needed up to 1-2 weeks at a time  Other orders -     cyclobenzaprine (FLEXERIL) 10 MG tablet; Take 1 tablet (10 mg total) by mouth at bedtime. -     traMADol (ULTRAM) 50 MG tablet; Take 2 tablets (100 mg total) by mouth 2 (two) times daily. -     gabapentin (NEURONTIN) 300 MG capsule; Take 1 capsule (300 mg total) by mouth 2 (two) times daily.        ----------------------------------------------------------------------------------------------------------------------  Problem List Items Addressed This Visit      Unprioritized   Chronic neck pain   Relevant Medications   meloxicam (MOBIC) 15 MG tablet   cyclobenzaprine (FLEXERIL) 10 MG tablet   traMADol (ULTRAM) 50 MG tablet   gabapentin (NEURONTIN) 300 MG capsule    Other Visit Diagnoses    Cervical radicular pain    -  Primary   Cervicalgia       DDD (degenerative disc disease), cervical       Relevant Medications   meloxicam (MOBIC) 15 MG tablet   cyclobenzaprine (FLEXERIL) 10 MG tablet   traMADol (ULTRAM) 50 MG tablet   Cervical facet syndrome       Cervical radiculitis       Relevant Medications   cyclobenzaprine (FLEXERIL) 10 MG tablet   gabapentin (NEURONTIN) 300 MG capsule        ----------------------------------------------------------------------------------------------------------------------  1.  Cervical radicular pain I have encouraged him to continue efforts at stretching and strengthening.  We talked about several opportunities for weight training for upper extremity strength encouragement.  I am going to start him on some Flexeril to be taken 1 nightly to help with some of the muscle spasms when he is having bad days.  2. Cervicalgia As above  3. DDD (degenerative disc disease), cervical As above  4. Cervical facet syndrome As above and continue physical therapy.  5. Cervical radiculitis As above and we will also start him on gabapentin 300 mg 1 p.o. nightly if he is having radicular symptoms and we talked about this at length.  6. Chronic neck pain We will restart his meloxicam for baseline support.  He can also take the tramadol for breakthrough pain up to 3 times a day 3 times daily dosing.  We will schedule him for return to clinic in 3 months.  He is to continue follow-up with Dr. Marcell Barlow  per routine - meloxicam (MOBIC) 15 MG tablet; Take 1 tablet (15 mg total) by mouth daily. As needed up to 1-2 weeks at a time  Dispense: 90 tablet; Refill: 1    ----------------------------------------------------------------------------------------------------------------------  I am having Isaiah Johnson "Isaiah Johnson" start on cyclobenzaprine, traMADol, and gabapentin. I am also having him maintain his multivitamin with minerals, vitamin E, aspirin EC, niacin, Fish Oil, fluocinonide cream, triamcinolone cream, b complex vitamins, Probiotic Product (PROBIOTIC DAILY PO), acetaminophen, Melatonin, fenofibrate micronized, augmented betamethasone dipropionate, doxycycline, baclofen, pravastatin, and meloxicam.   Meds ordered this encounter  Medications  . meloxicam (MOBIC) 15 MG tablet    Sig: Take 1 tablet (15 mg total) by mouth daily. As needed up to 1-2 weeks at a time    Dispense:  90 tablet    Refill:  1  . cyclobenzaprine (FLEXERIL) 10 MG tablet    Sig: Take 1 tablet (10 mg total) by  mouth at bedtime.    Dispense:  30 tablet    Refill:  3  . traMADol (ULTRAM) 50 MG tablet    Sig: Take 2 tablets (100 mg total) by mouth 2 (two) times daily.    Dispense:  120 tablet    Refill:  2  . gabapentin (NEURONTIN) 300 MG capsule    Sig: Take 1 capsule (300 mg total) by mouth 2 (two) times daily.    Dispense:  60 capsule    Refill:  2   Patient's Medications  New Prescriptions   CYCLOBENZAPRINE (FLEXERIL) 10 MG TABLET    Take 1 tablet (10 mg total) by mouth at bedtime.   GABAPENTIN (NEURONTIN) 300 MG CAPSULE    Take 1 capsule (300 mg total) by mouth 2 (two) times daily.   TRAMADOL (ULTRAM) 50 MG TABLET    Take 2 tablets (100 mg total) by mouth 2 (two) times daily.  Previous Medications   ACETAMINOPHEN (TYLENOL) 500 MG TABLET    Take 500 mg by mouth every 6 (six) hours as needed.   ASPIRIN EC 81 MG TABLET    Take 81 mg by mouth daily.   AUGMENTED BETAMETHASONE DIPROPIONATE (DIPROLENE-AF) 0.05 % CREAM       B COMPLEX VITAMINS CAPSULE    Take 1 capsule by mouth daily.   BACLOFEN (LIORESAL) 10 MG TABLET    TAKE 0.5-1 TABLETS (5-10 MG TOTAL) BY MOUTH 3 (THREE) TIMES DAILY AS NEEDED FOR MUSCLE SPASMS   DOXYCYCLINE (VIBRA-TABS) 100 MG TABLET    Take 1 tablet (100 mg total) by mouth 2 (two) times daily. For 10 days. Take with full glass of water, stay upright 30 min after taking.   FENOFIBRATE MICRONIZED (LOFIBRA) 67 MG CAPSULE    TAKE 1 CAPSULE (67 MG TOTAL) BY MOUTH DAILY BEFORE BREAKFAST.   FLUOCINONIDE CREAM (LIDEX) 0.05 %    Apply 1 application topically 2 (two) times daily as needed.   MELATONIN 200 MCG TABS    Take by mouth.   MULTIPLE VITAMIN (MULTIVITAMIN WITH MINERALS) TABS TABLET    Take 1 tablet by mouth daily.   NIACIN 500 MG TABLET    Take 500 mg by mouth at bedtime.   OMEGA-3 FATTY ACIDS (FISH OIL) 1000 MG CAPS    Take 1,000 mg by mouth daily.   PRAVASTATIN (PRAVACHOL) 40 MG TABLET    TAKE 1 TABLET BY MOUTH EVERY DAY   PROBIOTIC PRODUCT (PROBIOTIC DAILY PO)    Take by  mouth daily.   TRIAMCINOLONE CREAM (KENALOG) 0.1 %    Apply  1 application topically daily as needed.   VITAMIN E (VITAMIN E) 1000 UNIT CAPSULE    Take 1,000 Units by mouth daily.  Modified Medications   Modified Medication Previous Medication   MELOXICAM (MOBIC) 15 MG TABLET meloxicam (MOBIC) 15 MG tablet      Take 1 tablet (15 mg total) by mouth daily. As needed up to 1-2 weeks at a time    Take 1 tablet (15 mg total) by mouth daily. As needed up to 1-2 weeks at a time  Discontinued Medications   No medications on file   ----------------------------------------------------------------------------------------------------------------------  Follow-up: Return in about 3 months (around 01/06/2020) for evaluation, med refill.    Yevette Edwards, MD

## 2019-10-06 NOTE — Progress Notes (Signed)
Nursing Pain Medication Assessment:  Safety precautions to be maintained throughout the outpatient stay will include: orient to surroundings, keep bed in low position, maintain call bell within reach at all times, provide assistance with transfer out of bed and ambulation.  Medication Inspection Compliance: Isaiah Johnson did not comply with our request to bring his pills to be counted. He was reminded that bringing the medication bottles, even when empty, is a requirement.  Medication: None brought in. Pill/Patch Count: None available to be counted. Bottle Appearance: No container available. Did not bring bottle(s) to appointment. Filled Date: N/A Last Medication intake:  YesterdaySafety precautions to be maintained throughout the outpatient stay will include: orient to surroundings, keep bed in low position, maintain call bell within reach at all times, provide assistance with transfer out of bed and ambulation.

## 2019-10-07 ENCOUNTER — Other Ambulatory Visit: Payer: Self-pay | Admitting: Pain Medicine

## 2019-10-07 DIAGNOSIS — G8929 Other chronic pain: Secondary | ICD-10-CM | POA: Diagnosis not present

## 2019-10-07 DIAGNOSIS — M542 Cervicalgia: Secondary | ICD-10-CM | POA: Diagnosis not present

## 2019-10-08 ENCOUNTER — Other Ambulatory Visit: Payer: Self-pay | Admitting: *Deleted

## 2019-10-08 DIAGNOSIS — G8929 Other chronic pain: Secondary | ICD-10-CM

## 2019-10-08 DIAGNOSIS — M542 Cervicalgia: Secondary | ICD-10-CM

## 2019-10-13 ENCOUNTER — Telehealth: Payer: Self-pay | Admitting: Family Medicine

## 2019-10-13 DIAGNOSIS — E7849 Other hyperlipidemia: Secondary | ICD-10-CM

## 2019-10-13 NOTE — Telephone Encounter (Signed)
Requested medications are due for refill today?  Yes  Requested medications are on active medication list?  Yes  Last Refill:   09/17/2018  # 90 with 3 refills   Future visit scheduled?  No   Notes to Clinic:  Medication failed RX refill protocol due to no valid labs performed within the past 180 / 360 days.  Last labs performed on 05/29/2017.

## 2019-10-15 LAB — TOXASSURE SELECT 13 (MW), URINE

## 2019-10-19 NOTE — Telephone Encounter (Signed)
Patient scheduled for CPE on 03/22/2020. Patient would like to know if this medication can be refilled since he is scheduled for appointment.

## 2019-10-21 ENCOUNTER — Encounter: Payer: BC Managed Care – PPO | Admitting: Family Medicine

## 2019-10-28 ENCOUNTER — Ambulatory Visit (INDEPENDENT_AMBULATORY_CARE_PROVIDER_SITE_OTHER): Payer: BC Managed Care – PPO | Admitting: Family Medicine

## 2019-10-28 ENCOUNTER — Other Ambulatory Visit: Payer: Self-pay

## 2019-10-28 ENCOUNTER — Encounter: Payer: Self-pay | Admitting: Family Medicine

## 2019-10-28 VITALS — BP 118/85 | HR 80 | Temp 97.7°F | Resp 16 | Ht 69.0 in | Wt 234.0 lb

## 2019-10-28 DIAGNOSIS — Z0000 Encounter for general adult medical examination without abnormal findings: Secondary | ICD-10-CM | POA: Diagnosis not present

## 2019-10-28 DIAGNOSIS — E7849 Other hyperlipidemia: Secondary | ICD-10-CM

## 2019-10-28 DIAGNOSIS — Z981 Arthrodesis status: Secondary | ICD-10-CM | POA: Insufficient documentation

## 2019-10-28 DIAGNOSIS — Z125 Encounter for screening for malignant neoplasm of prostate: Secondary | ICD-10-CM

## 2019-10-28 DIAGNOSIS — E669 Obesity, unspecified: Secondary | ICD-10-CM

## 2019-10-28 DIAGNOSIS — Z1159 Encounter for screening for other viral diseases: Secondary | ICD-10-CM | POA: Diagnosis not present

## 2019-10-28 DIAGNOSIS — R7309 Other abnormal glucose: Secondary | ICD-10-CM | POA: Diagnosis not present

## 2019-10-28 MED ORDER — FENOFIBRATE MICRONIZED 67 MG PO CAPS: 67.0000 mg | ORAL_CAPSULE | Freq: Every day | ORAL | 3 refills | Status: DC

## 2019-10-28 MED ORDER — PRAVASTATIN SODIUM 40 MG PO TABS: 40.0000 mg | ORAL_TABLET | Freq: Every day | ORAL | 3 refills | Status: DC

## 2019-10-28 NOTE — Progress Notes (Signed)
Subjective:    Patient ID: Isaiah Johnson, male    DOB: December 27, 1965, 54 y.o.   MRN: [id number]  Isaiah Johnson is a 54 y.o. male presenting on 10/28/2019 for Annual Exam   HPI   Here for Annual Physical and due for fasting labs today.  HYPERLIPIDEMIA / Low HDL / Obesity BMI >34 - Reports no concerns. Last lipid panel 2020, mostly controlled except mild low HDL - Currently taking Pravastatin 40mg  and Fenofibrate 67mg  daily, tolerating well without side effects or myalgias - needs refill Lifestyle - Diet: Balanced diet, still improving. - Exercise: walking exercise. - Taking ASA 81  Chronic Neck / Shoulder Pain / History of DJD Cervical  Followed by Dr [facility] Pain Management), [facility] Neurosurgery, s/p cervical spinal fusion Dr [facility] (08/2019)   Health Maintenance: UTD Flu vaccine, TDap UTD routine HIV screening  Prostate CA Screening: Prior PSA reported normal. Last PSA 1.2 (2019). Currently asymptomatic. No known family history of prostate CA. Due for screening.  Colon CA Screening: Last Colonoscopy 2013 and then most recent 09/28/17 (done by [facility] GI Dr 09/30/17), results with polyp x 1 adenoma benign, good for 5 years, next due return 09/2022. Currently asymptomatic. Known family history of colon CA, maternal uncle age 23s. Not due yet for repeat.  Depression screen [facility] 2/9 10/28/2019 11/13/2018 06/06/2018  Decreased Interest 0 0 0  Down, Depressed, Hopeless 0 0 0  PHQ - 2 Score 0 0 0    Past Medical History:  Diagnosis Date  . Chicken pox   . Colon polyps   . H/O rubella   . History of measles   . Hyperlipidemia   . Psoriasis    Past Surgical History:  Procedure Laterality Date  . CHOLECYSTECTOMY  1996   Social History   Socioeconomic History  . Marital status: Married    Spouse name: 01/13/2019  . Number of children: Not on file  . Years of education: High school  . Highest education level: Not on file  Occupational History  . Occupation:  06/08/2018  Tobacco Use  . Smoking status: Never Smoker  . Smokeless tobacco: Never Used  Substance and Sexual Activity  . Alcohol use: Yes    Comment: 1 drink  . Drug use: No  . Sexual activity: Not on file  Other Topics Concern  . Not on file  Social History Narrative  . Not on file   Social Determinants of Health   Financial Resource Strain:   . Difficulty of Paying Living Expenses:   Food Insecurity:   . Worried About Chiropodist in the Last Year:   . Environmental consultant in the Last Year:   Transportation Needs:   . Programme researcher, broadcasting/film/video (Medical):   Barista Lack of Transportation (Non-Medical):   Physical Activity:   . Days of Exercise per Week:   . Minutes of Exercise per Session:   Stress:   . Feeling of Stress :   Social Connections:   . Frequency of Communication with Friends and Family:   . Frequency of Social Gatherings with Friends and Family:   . Attends Religious Services:   . Active Member of Clubs or Organizations:   . Attends Freight forwarder Meetings:   Marland Kitchen Marital Status:   Intimate Partner Violence:   . Fear of Current or Ex-Partner:   . Emotionally Abused:   Banker Physically Abused:   . Sexually Abused:    Family History  Problem Relation  Age of Onset  . Diabetes Mother   . Heart disease Mother   . Heart disease Father   . Depression Father   . Sleep apnea Brother   . Colon cancer Maternal Uncle   . Prostate cancer Neg Hx    Current Outpatient Medications on File Prior to Visit  Medication Sig  . acetaminophen (TYLENOL) 500 MG tablet Take 500 mg by mouth every 6 (six) hours as needed.  Marland Kitchen aspirin EC 81 MG tablet Take 81 mg by mouth daily.  Marland Kitchen augmented betamethasone dipropionate (DIPROLENE-AF) 0.05 % cream   . b complex vitamins capsule Take 1 capsule by mouth daily.  . cyclobenzaprine (FLEXERIL) 10 MG tablet Take 1 tablet (10 mg total) by mouth at bedtime.  . fluocinonide cream (LIDEX) 0.05 % Apply 1 application topically 2 (two)  times daily as needed.  . gabapentin (NEURONTIN) 300 MG capsule Take 1 capsule (300 mg total) by mouth 2 (two) times daily.  . Melatonin 200 MCG TABS Take by mouth.  . meloxicam (MOBIC) 15 MG tablet Take 1 tablet (15 mg total) by mouth daily. As needed up to 1-2 weeks at a time  . Multiple Vitamin (MULTIVITAMIN WITH MINERALS) TABS tablet Take 1 tablet by mouth daily.  . niacin 500 MG tablet Take 500 mg by mouth at bedtime.  . Omega-3 Fatty Acids (FISH OIL) 1000 MG CAPS Take 1,000 mg by mouth daily.  . Probiotic Product (PROBIOTIC DAILY PO) Take by mouth daily.  . traMADol (ULTRAM) 50 MG tablet Take 2 tablets (100 mg total) by mouth 2 (two) times daily.  Marland Kitchen triamcinolone cream (KENALOG) 0.1 % Apply 1 application topically daily as needed.   No current facility-administered medications on file prior to visit.    Review of Systems  Constitutional: Negative for activity change, appetite change, chills, diaphoresis, fatigue and fever.  HENT: Negative for congestion and hearing loss.   Eyes: Negative for visual disturbance.  Respiratory: Negative for apnea, cough, chest tightness, shortness of breath and wheezing.   Cardiovascular: Negative for chest pain, palpitations and leg swelling.  Gastrointestinal: Negative for abdominal pain, constipation, diarrhea, nausea and vomiting.  Endocrine: Negative for cold intolerance.  Genitourinary: Negative for difficulty urinating, dysuria, frequency and hematuria.  Musculoskeletal: Negative for arthralgias and neck pain.  Skin: Negative for rash.  Allergic/Immunologic: Negative for environmental allergies.  Neurological: Negative for dizziness, weakness, light-headedness, numbness and headaches.  Hematological: Negative for adenopathy.  Psychiatric/Behavioral: Negative for behavioral problems, dysphoric mood and sleep disturbance.   Per HPI unless specifically indicated above       Objective:    BP 118/85   Pulse 80   Temp 97.7 F (36.5 C)  (Temporal)   Resp 16   Ht 5\' 9"  (1.753 m)   Wt 234 lb (106.1 kg)   SpO2 97%   BMI 34.56 kg/m   Wt Readings from Last 3 Encounters:  10/28/19 234 lb (106.1 kg)  10/06/19 230 lb (104.3 kg)  11/13/18 241 lb 12.8 oz (109.7 kg)    Physical Exam Vitals and nursing note reviewed.  Constitutional:      General: He is not in acute distress.    Appearance: He is well-developed. He is not diaphoretic.     Comments: Well-appearing, comfortable, cooperative  HENT:     Head: Normocephalic and atraumatic.     Right Ear: Tympanic membrane, ear canal and external ear normal.     Left Ear: Tympanic membrane, ear canal and external ear normal.  Eyes:  General:        Right eye: No discharge.        Left eye: No discharge.     Conjunctiva/sclera: Conjunctivae normal.     Pupils: Pupils are equal, round, and reactive to light.  Neck:     Thyroid: No thyromegaly.     Vascular: No carotid bruit.  Cardiovascular:     Rate and Rhythm: Normal rate and regular rhythm.     Heart sounds: Normal heart sounds. No murmur heard.   Pulmonary:     Effort: Pulmonary effort is normal. No respiratory distress.     Breath sounds: Normal breath sounds. No wheezing or rales.  Abdominal:     General: Bowel sounds are normal. There is no distension.     Palpations: Abdomen is soft. There is no mass.     Tenderness: There is no abdominal tenderness.  Musculoskeletal:        General: No tenderness. Normal range of motion.     Cervical back: Normal range of motion and neck supple.     Right lower leg: No edema.     Left lower leg: No edema.     Comments: Upper / Lower Extremities: - Normal muscle tone, strength bilateral upper extremities 5/5, lower extremities 5/5  Lymphadenopathy:     Cervical: No cervical adenopathy.  Skin:    General: Skin is warm and dry.     Findings: No erythema or rash.  Neurological:     Mental Status: He is alert and oriented to person, place, and time.     Comments: Distal  sensation intact to light touch all extremities  Psychiatric:        Behavior: Behavior normal.     Comments: Well groomed, good eye contact, normal speech and thoughts        Results for orders placed or performed in visit on 10/08/19  ToxASSURE Select 13 (MW), Urine  Result Value Ref Range   Summary Note       Assessment & Plan:   Problem List Items Addressed This Visit    S/P cervical spinal fusion   Obesity (BMI 30.0-34.9)    BMI >34 Improving weight loss and lifestyle      Hyperlipidemia    Previously mostly controlled cholesterol on statin fibrate and lifestyle Last lipid panel 2019  Check lipids fasting today  Plan: 1. Continue current meds - Pravastatin 40mg , Fenofibrate 67 2. Continue ASA 81mg  for primary ASCVD risk reduction 3. Encourage improved lifestyle - low carb/cholesterol, reduce portion size, continue improving regular exercise      Relevant Medications   pravastatin (PRAVACHOL) 40 MG tablet   fenofibrate micronized (LOFIBRA) 67 MG capsule   Other Relevant Orders   CBC with Differential/Platelet   COMPLETE METABOLIC PANEL WITH GFR   Lipid panel    Other Visit Diagnoses    Annual physical exam    -  Primary   Relevant Orders   Hemoglobin A1c   CBC with Differential/Platelet   COMPLETE METABOLIC PANEL WITH GFR   Lipid panel   PSA   Need for hepatitis C screening test       Relevant Orders   Hepatitis C antibody   Screening for prostate cancer       Relevant Orders   PSA   Abnormal glucose       Relevant Orders   Hemoglobin A1c     Updated Health Maintenance information Ordered new labs today fasting. Encouraged improvement to lifestyle with diet  and exercise - Goal of weight loss    Orders Placed This Encounter  Procedures  . Hemoglobin A1c  . CBC with Differential/Platelet  . COMPLETE METABOLIC PANEL WITH GFR  . Lipid panel    Order Specific Question:   Has the patient fasted?    Answer:   Yes  . PSA  . Hepatitis C  antibody     Meds ordered this encounter  Medications  . pravastatin (PRAVACHOL) 40 MG tablet    Sig: Take 1 tablet (40 mg total) by mouth daily.    Dispense:  90 tablet    Refill:  3  . fenofibrate micronized (LOFIBRA) 67 MG capsule    Sig: Take 1 capsule (67 mg total) by mouth daily before breakfast.    Dispense:  90 capsule    Refill:  3     Follow up plan: Return in about 1 year (around 10/27/2020) for Annual Physical (fasting lab AFTER apt).  Saralyn Pilar, DO [facility] 10/28/2019, 9:46 AM

## 2019-10-28 NOTE — Assessment & Plan Note (Addendum)
Previously mostly controlled cholesterol on statin fibrate and lifestyle Last lipid panel 2019  Check lipids fasting today  Plan: 1. Continue current meds - Pravastatin 40mg , Fenofibrate 67 2. Continue ASA 81mg  for primary ASCVD risk reduction 3. Encourage improved lifestyle - low carb/cholesterol, reduce portion size, continue improving regular exercise

## 2019-10-28 NOTE — Patient Instructions (Addendum)
Thank you for coming to the office today.  Refilled cholesterol meds Pravastatin, Fenofibrate for 1 year, 90 days  Can try topical Voltaren (Diclofenac) gel as needed for anti inflammatory up to 2 to 4 times a day as needed can limit meloxicam pill if needed.  DUE for FASTING BLOOD WORK (no food or drink after midnight before the lab appointment, only water or coffee without cream/sugar on the morning of)  SCHEDULE "Lab Only" visit in the morning at the clinic for lab draw in 1 YEAR  - Make sure Lab Only appointment is at about 1 week before your next appointment, so that results will be available  For Lab Results, once available within 2-3 days of blood draw, you can can log in to MyChart online to view your results and a brief explanation. Also, we can discuss results at next follow-up visit.   Please schedule a Follow-up Appointment to: Return in about 1 year (around 10/27/2020) for Annual Physical (fasting lab AFTER apt).  If you have any other questions or concerns, please feel free to call the office or send a message through MyChart. You may also schedule an earlier appointment if necessary.  Additionally, you may be receiving a survey about your experience at our office within a few days to 1 week by e-mail or mail. We value your feedback.  Saralyn Pilar, DO [facility], [place]

## 2019-10-28 NOTE — Assessment & Plan Note (Signed)
BMI >34 Improving weight loss and lifestyle

## 2019-10-29 LAB — COMPLETE METABOLIC PANEL WITH GFR
AG Ratio: 2.4 (calc) (ref 1.0–2.5)
ALT: 22 U/L (ref 9–46)
AST: 18 U/L (ref 10–35)
Albumin: 4.6 g/dL (ref 3.6–5.1)
Alkaline phosphatase (APISO): 73 U/L (ref 35–144)
BUN: 15 mg/dL (ref 7–25)
CO2: 27 mmol/L (ref 20–32)
Calcium: 9.5 mg/dL (ref 8.6–10.3)
Chloride: 103 mmol/L (ref 98–110)
Creat: 0.78 mg/dL (ref 0.70–1.33)
GFR, Est African American: 119 mL/min/{1.73_m2} (ref >=60)
GFR, Est Non African American: 103 mL/min/{1.73_m2} (ref >=60)
Globulin: 1.9 g/dL (calc) (ref 1.9–3.7)
Glucose, Bld: 96 mg/dL (ref 65–99)
Potassium: 4.6 mmol/L (ref 3.5–5.3)
Sodium: 139 mmol/L (ref 135–146)
Total Bilirubin: 0.5 mg/dL (ref 0.2–1.2)
Total Protein: 6.5 g/dL (ref 6.1–8.1)

## 2019-10-29 LAB — LIPID PANEL
Cholesterol: 169 mg/dL (ref <200)
HDL: 44 mg/dL (ref >=40)
LDL Cholesterol (Calc): 101 mg/dL (calc) — ABNORMAL HIGH
Non-HDL Cholesterol (Calc): 125 mg/dL (calc) (ref <130)
Total CHOL/HDL Ratio: 3.8 (calc) (ref <5.0)
Triglycerides: 144 mg/dL (ref <150)

## 2019-10-29 LAB — CBC WITH DIFFERENTIAL/PLATELET
Absolute Monocytes: 445 cells/uL (ref 200–950)
Basophils Absolute: 62 cells/uL (ref 0–200)
Basophils Relative: 0.7 %
Eosinophils Absolute: 605 cells/uL — ABNORMAL HIGH (ref 15–500)
Eosinophils Relative: 6.8 %
HCT: 49 % (ref 38.5–50.0)
Hemoglobin: 16.3 g/dL (ref 13.2–17.1)
Lymphs Abs: 1575 cells/uL (ref 850–3900)
MCH: 28.8 pg (ref 27.0–33.0)
MCHC: 33.3 g/dL (ref 32.0–36.0)
MCV: 86.7 fL (ref 80.0–100.0)
MPV: 9.6 fL (ref 7.5–12.5)
Monocytes Relative: 5 %
Neutro Abs: 6212 cells/uL (ref 1500–7800)
Neutrophils Relative %: 69.8 %
Platelets: 269 10*3/uL (ref 140–400)
RBC: 5.65 10*6/uL (ref 4.20–5.80)
RDW: 12.1 % (ref 11.0–15.0)
Total Lymphocyte: 17.7 %
WBC: 8.9 10*3/uL (ref 3.8–10.8)

## 2019-10-29 LAB — HEPATITIS C ANTIBODY
Hepatitis C Ab: NONREACTIVE
SIGNAL TO CUT-OFF: 0 (ref <1.00)

## 2019-10-29 LAB — PSA: PSA: 2 ng/mL (ref <=4.0)

## 2019-10-29 LAB — HEMOGLOBIN A1C
Hgb A1c MFr Bld: 5.5 % of total Hgb (ref <5.7)
Mean Plasma Glucose: 111 (calc)
eAG (mmol/L): 6.2 (calc)

## 2019-12-01 DIAGNOSIS — L309 Dermatitis, unspecified: Secondary | ICD-10-CM | POA: Diagnosis not present

## 2019-12-06 ENCOUNTER — Other Ambulatory Visit: Payer: Self-pay | Admitting: Anesthesiology

## 2020-01-02 ENCOUNTER — Other Ambulatory Visit: Payer: Self-pay | Admitting: Anesthesiology

## 2020-01-30 ENCOUNTER — Other Ambulatory Visit: Payer: Self-pay | Admitting: Anesthesiology

## 2020-02-27 ENCOUNTER — Other Ambulatory Visit: Payer: Self-pay | Admitting: Anesthesiology

## 2020-03-07 ENCOUNTER — Other Ambulatory Visit: Payer: Self-pay

## 2020-03-07 ENCOUNTER — Encounter: Payer: Self-pay | Admitting: Anesthesiology

## 2020-03-07 ENCOUNTER — Ambulatory Visit: Payer: BC Managed Care – PPO | Attending: Anesthesiology | Admitting: Anesthesiology

## 2020-03-07 DIAGNOSIS — M5030 Other cervical disc degeneration, unspecified cervical region: Secondary | ICD-10-CM | POA: Diagnosis not present

## 2020-03-07 DIAGNOSIS — R29898 Other symptoms and signs involving the musculoskeletal system: Secondary | ICD-10-CM

## 2020-03-07 DIAGNOSIS — M47812 Spondylosis without myelopathy or radiculopathy, cervical region: Secondary | ICD-10-CM | POA: Diagnosis not present

## 2020-03-07 DIAGNOSIS — M5412 Radiculopathy, cervical region: Secondary | ICD-10-CM

## 2020-03-07 DIAGNOSIS — G8929 Other chronic pain: Secondary | ICD-10-CM

## 2020-03-07 DIAGNOSIS — M542 Cervicalgia: Secondary | ICD-10-CM

## 2020-03-07 MED ORDER — TRAMADOL HCL 50 MG PO TABS
100.0000 mg | ORAL_TABLET | Freq: Two times a day (BID) | ORAL | 2 refills | Status: AC | PRN
Start: 2020-03-07 — End: 2020-04-06

## 2020-03-07 NOTE — Progress Notes (Signed)
Virtual Visit via Video Note  I connected with Isaiah Johnson on 03/07/20 at  1:20 PM EST by a video enabled telemedicine application and verified that I am speaking with the correct person using two identifiers.  Location: Patient: Home Provider: Pain control center   I discussed the limitations of evaluation and management by telemedicine and the availability of in person appointments. The patient expressed understanding and agreed to proceed.  History of Present Illness: I spoke with Isaiah Johnson via video for his virtual conference he reports that he is been doing well regarding his cervical radicular type pain.  He still having some left side posterior neck pain and this is generally controlled with 2 tramadol in the morning and 2 tramadol in the evening.  He notes that when he fails to take his gabapentin on a twice daily dosing schedule his pain goes from a baseline 3 to an 8 and so he is feeling that the gabapentin is very effective in controlling his pain.  Otherwise he is not having any side effects with his medications continue to try and do stretching strengthening exercises as best tolerated.  He is scheduled to see his neurosurgeon for reevaluation following his preceding neck surgery in the next few weeks.   Observations/Objective:   Current Outpatient Medications:    acetaminophen (TYLENOL) 500 MG tablet, Take 500 mg by mouth every 6 (six) hours as needed., Disp: , Rfl:    aspirin EC 81 MG tablet, Take 81 mg by mouth daily., Disp: , Rfl:    b complex vitamins capsule, Take 1 capsule by mouth daily., Disp: , Rfl:    fenofibrate micronized (LOFIBRA) 67 MG capsule, Take 1 capsule (67 mg total) by mouth daily before breakfast., Disp: 90 capsule, Rfl: 3   Melatonin 200 MCG TABS, Take by mouth., Disp: , Rfl:    meloxicam (MOBIC) 15 MG tablet, Take 1 tablet (15 mg total) by mouth daily. As needed up to 1-2 weeks at a time, Disp: 90 tablet, Rfl: 1   Multiple Vitamin (MULTIVITAMIN  WITH MINERALS) TABS tablet, Take 1 tablet by mouth daily., Disp: , Rfl:    niacin 500 MG tablet, Take 500 mg by mouth at bedtime., Disp: , Rfl:    Omega-3 Fatty Acids (FISH OIL) 1000 MG CAPS, Take 1,000 mg by mouth daily., Disp: , Rfl:    pravastatin (PRAVACHOL) 40 MG tablet, Take 1 tablet (40 mg total) by mouth daily., Disp: 90 tablet, Rfl: 3   augmented betamethasone dipropionate (DIPROLENE-AF) 0.05 % cream, , Disp: , Rfl:    fluocinonide cream (LIDEX) 0.05 %, Apply 1 application topically 2 (two) times daily as needed., Disp: , Rfl:    gabapentin (NEURONTIN) 300 MG capsule, Take 1 capsule (300 mg total) by mouth 2 (two) times daily., Disp: 60 capsule, Rfl: 2   Probiotic Product (PROBIOTIC DAILY PO), Take by mouth daily., Disp: , Rfl:    traMADol (ULTRAM) 50 MG tablet, Take 2 tablets (100 mg total) by mouth 2 (two) times daily as needed for moderate pain., Disp: 120 tablet, Rfl: 2   triamcinolone cream (KENALOG) 0.1 %, Apply 1 application topically daily as needed., Disp: , Rfl:  Assessment and Plan:  1. Cervicalgia   2. Chronic neck pain   3. Cervical radicular pain   4. DDD (degenerative disc disease), cervical   5. Cervical facet syndrome   6. Upper extremity weakness   Based on our discussion today and upon review of the [facility] practitioner database information I  refill his tramadol.  Is a be for 2 tablets in the morning 2 tablets in the evening as needed for the next 3 months.  We will schedule him for a 2 to 36-month return to clinic.  I encouraged him to continue with stretching strengthening exercises and physical therapy exercises with follow-up with his neurosurgeon.  He is to continue follow-up with his primary care physicians for his baseline medical care as well.  30 Follow Up Instructions:    I discussed the assessment and treatment plan with the patient. The patient was provided an opportunity to ask questions and all were answered. The patient agreed with the  plan and demonstrated an understanding of the instructions.   The patient was advised to call back or seek an in-person evaluation if the symptoms worsen or if the condition fails to improve as anticipated.  I provided 30 minutes of non-face-to-face time during this encounter.   Yevette Edwards, MD

## 2020-05-21 ENCOUNTER — Other Ambulatory Visit: Payer: Self-pay | Admitting: Anesthesiology

## 2020-05-21 DIAGNOSIS — G8929 Other chronic pain: Secondary | ICD-10-CM

## 2020-06-26 ENCOUNTER — Other Ambulatory Visit: Payer: Self-pay | Admitting: Anesthesiology

## 2020-06-27 ENCOUNTER — Telehealth: Payer: Self-pay

## 2020-06-27 NOTE — Telephone Encounter (Signed)
Pt needs Tramadol refilled, he will be out of medication next week

## 2020-06-28 ENCOUNTER — Telehealth: Payer: Self-pay | Admitting: *Deleted

## 2020-06-29 NOTE — Telephone Encounter (Signed)
Scheduled virtual for 07/06/20

## 2020-07-06 ENCOUNTER — Encounter: Payer: Self-pay | Admitting: Anesthesiology

## 2020-07-06 ENCOUNTER — Other Ambulatory Visit: Payer: Self-pay

## 2020-07-06 ENCOUNTER — Ambulatory Visit: Payer: No Typology Code available for payment source | Attending: Anesthesiology | Admitting: Anesthesiology

## 2020-07-06 DIAGNOSIS — G8929 Other chronic pain: Secondary | ICD-10-CM

## 2020-07-06 DIAGNOSIS — R29898 Other symptoms and signs involving the musculoskeletal system: Secondary | ICD-10-CM

## 2020-07-06 DIAGNOSIS — M542 Cervicalgia: Secondary | ICD-10-CM | POA: Diagnosis not present

## 2020-07-06 DIAGNOSIS — M47812 Spondylosis without myelopathy or radiculopathy, cervical region: Secondary | ICD-10-CM | POA: Diagnosis not present

## 2020-07-06 DIAGNOSIS — M5412 Radiculopathy, cervical region: Secondary | ICD-10-CM | POA: Diagnosis not present

## 2020-07-06 DIAGNOSIS — M5030 Other cervical disc degeneration, unspecified cervical region: Secondary | ICD-10-CM

## 2020-07-06 MED ORDER — TRAMADOL HCL 50 MG PO TABS: 100.0000 mg | ORAL_TABLET | Freq: Four times a day (QID) | ORAL | 2 refills | Status: DC | PRN

## 2020-07-06 MED ORDER — GABAPENTIN 300 MG PO CAPS: 300.0000 mg | ORAL_CAPSULE | Freq: Two times a day (BID) | ORAL | 2 refills | Status: DC

## 2020-07-06 NOTE — Progress Notes (Signed)
Virtual Visit via Telephone Note  I connected with Isaiah Johnson on 07/06/20 at  2:50 PM EDT by telephone and verified that I am speaking with the correct person using two identifiers.  Location: Patient: Home Provider: Pain control center   I discussed the limitations, risks, security and privacy concerns of performing an evaluation and management service by telephone and the availability of in person appointments. I also discussed with the patient that there may be a patient responsible charge related to this service. The patient expressed understanding and agreed to proceed.   History of Present Illness: I spoke with Isaiah Johnson via telephone for his virtual conference.  He was unable to do the video portion of this.  He states that he is still having a fair amount of left posterior neck pain following his recent surgery and he has chronic intermittent neck pain depending on the activity level.  He still taking gabapentin on an as-needed basis up to twice a day and taking tramadol 2 tablets in the morning and occasionally 1 in the afternoon at 1 PM.  He gets 70 to 80% relief with this lasting about 6 hours.  No side effects reported.  His right upper extremity strength has improved following his recent surgery but he is frustrated that he still having some posterior neck pain.  The pain is worse with certain activities where he is using his arms.  No other changes are noted and his strength has been preserved.  No side effects are reported with his medication management.  He still taking meloxicam on a daily basis as well.  He does report that he is concerned about his mental acuity and questions whether this might be medication related age-related or more of a age or genetic related problem.  He feels that he is more forgetful and has some mental clouding periodically.   Observations/Objective:  Current Outpatient Medications:  .  traMADol (ULTRAM) 50 MG tablet, Take 2 tablets (100 mg total)  by mouth every 6 (six) hours as needed., Disp: 90 tablet, Rfl: 2 .  acetaminophen (TYLENOL) 500 MG tablet, Take 500 mg by mouth every 6 (six) hours as needed., Disp: , Rfl:  .  aspirin EC 81 MG tablet, Take 81 mg by mouth daily., Disp: , Rfl:  .  augmented betamethasone dipropionate (DIPROLENE-AF) 0.05 % cream, , Disp: , Rfl:  .  b complex vitamins capsule, Take 1 capsule by mouth daily., Disp: , Rfl:  .  fenofibrate micronized (LOFIBRA) 67 MG capsule, Take 1 capsule (67 mg total) by mouth daily before breakfast., Disp: 90 capsule, Rfl: 3 .  fluocinonide cream (LIDEX) 0.05 %, Apply 1 application topically 2 (two) times daily as needed., Disp: , Rfl:  .  gabapentin (NEURONTIN) 300 MG capsule, Take 1 capsule (300 mg total) by mouth 2 (two) times daily., Disp: 60 capsule, Rfl: 2 .  Melatonin 200 MCG TABS, Take by mouth., Disp: , Rfl:  .  meloxicam (MOBIC) 15 MG tablet, Take 1 tablet (15 mg total) by mouth daily. As needed up to 1-2 weeks at a time, Disp: 90 tablet, Rfl: 1 .  Multiple Vitamin (MULTIVITAMIN WITH MINERALS) TABS tablet, Take 1 tablet by mouth daily., Disp: , Rfl:  .  niacin 500 MG tablet, Take 500 mg by mouth at bedtime., Disp: , Rfl:  .  Omega-3 Fatty Acids (FISH OIL) 1000 MG CAPS, Take 1,000 mg by mouth daily., Disp: , Rfl:  .  pravastatin (PRAVACHOL) 40 MG tablet, Take 1 tablet (  40 mg total) by mouth daily., Disp: 90 tablet, Rfl: 3 .  Probiotic Product (PROBIOTIC DAILY PO), Take by mouth daily., Disp: , Rfl:  .  triamcinolone cream (KENALOG) 0.1 %, Apply 1 application topically daily as needed., Disp: , Rfl:   Assessment and Plan: 1. Cervicalgia   2. Chronic neck pain   3. Cervical radicular pain   4. DDD (degenerative disc disease), cervical   5. Cervical facet syndrome   6. Upper extremity weakness   7. Cervical radiculitis   At this point and we refill his medications for the tramadol.  Enabled him to take this up to 2 tablets 3 times a day if necessary.  2 refills will be  called in for that.  He can continue with his meloxicam and gabapentin as currently prescribed.  I did give him some advice on activities for stretching strengthening for strengthening of this is cervical posterior musculature and advised him to avoid certain activities which may be precipitating some of the cervical facet pain that he is describing.  He may be a candidate for a cervical facet block eventually as well.  I do not feel any further interventional studies are indicated at this time.  In regards to his mentation, I have reassured him that much of this may be age-related and secondary to some of the stress he is experienced with the recent loss of a relative.  Combination of several of these things may be playing into that however if this persists he should see his primary care physician for further evaluation.  Some of this also may be medication mediated and he could discontinue the gabapentin and tramadol for 3 to 5 days to see if some of this improves as well.  If it persists he needs to see his primary care physician.  He is currently scheduled for a 87-month return.  Follow Up Instructions:    I discussed the assessment and treatment plan with the patient. The patient was provided an opportunity to ask questions and all were answered. The patient agreed with the plan and demonstrated an understanding of the instructions.   The patient was advised to call back or seek an in-person evaluation if the symptoms worsen or if the condition fails to improve as anticipated.  I provided 30 minutes of non-face-to-face time during this encounter.   Yevette Edwards, MD Home

## 2020-07-12 NOTE — Addendum Note (Signed)
Addended by: Yevette Edwards on: 07/12/2020 02:39 PM   Modules accepted: Orders

## 2020-07-14 MED ORDER — TRAMADOL HCL 50 MG PO TABS: 100.0000 mg | ORAL_TABLET | Freq: Four times a day (QID) | ORAL | 2 refills | Status: AC | PRN

## 2020-07-14 NOTE — Addendum Note (Signed)
Addended by: Yevette Edwards on: 07/14/2020 06:50 AM   Modules accepted: Orders

## 2020-08-01 ENCOUNTER — Telehealth (INDEPENDENT_AMBULATORY_CARE_PROVIDER_SITE_OTHER): Payer: Self-pay | Admitting: Family Medicine

## 2020-08-01 ENCOUNTER — Encounter: Payer: Self-pay | Admitting: Family Medicine

## 2020-08-01 ENCOUNTER — Other Ambulatory Visit: Payer: Self-pay

## 2020-08-01 DIAGNOSIS — J9801 Acute bronchospasm: Secondary | ICD-10-CM

## 2020-08-01 DIAGNOSIS — J029 Acute pharyngitis, unspecified: Secondary | ICD-10-CM

## 2020-08-01 DIAGNOSIS — J0100 Acute maxillary sinusitis, unspecified: Secondary | ICD-10-CM

## 2020-08-01 MED ORDER — IPRATROPIUM BROMIDE 0.06 % NA SOLN: 2.0000 | Freq: Four times a day (QID) | NASAL | 0 refills | Status: DC

## 2020-08-01 MED ORDER — BENZONATATE 100 MG PO CAPS: 100.0000 mg | ORAL_CAPSULE | Freq: Three times a day (TID) | ORAL | 0 refills | Status: DC | PRN

## 2020-08-01 MED ORDER — ALBUTEROL SULFATE HFA 108 (90 BASE) MCG/ACT IN AERS: 2.0000 | INHALATION_SPRAY | RESPIRATORY_TRACT | 0 refills | Status: DC | PRN

## 2020-08-01 NOTE — Patient Instructions (Addendum)
Start Tessalon Perls take 1 capsule up to 3 times a day as needed for cough  Start Atrovent nasal spray decongestant 2 sprays in each nostril up to 4 times daily for 7 days  Albuterol as needed for coughing  If not improved can contact us within 48-72 hours if not improved   Please schedule a Follow-up Appointment to: Return in about 3 days (around 08/04/2020), or if symptoms worsen or fail to improve.  If you have any other questions or concerns, please feel free to call the office or send a message through MyChart. You may also schedule an earlier appointment if necessary.  Additionally, you may be receiving a survey about your experience at our office within a few days to 1 week by e-mail or mail. We value your feedback.  Saralyn Pilar, DO [facility], [place]

## 2020-08-01 NOTE — Progress Notes (Signed)
Virtual Visit via Telephone The purpose of this virtual visit is to provide medical care while limiting exposure to the novel coronavirus (COVID19) for both patient and office staff.  Consent was obtained for phone visit:  Yes.   Answered questions that patient had about telehealth interaction:  Yes.   I discussed the limitations, risks, security and privacy concerns of performing an evaluation and management service by telephone. I also discussed with the patient that there may be a patient responsible charge related to this service. The patient expressed understanding and agreed to proceed.  Patient Location: Home Provider Location: [facility] (Office)  Participants in virtual visit: - Patient: Isaiah Johnson" Jacques Navy - CMA: Burnell Blanks, CMA - Provider: Dr Althea Charon  ---------------------------------------------------------------------- Chief Complaint  Patient presents with  . Sore Throat    Coughing, chills, productive coughing, insomnia and nasal congestion x 4 days. X3 negative Home Covid test     S: Reviewed CMA documentation. I have called patient and gathered additional HPI as follows:  URI / Sore Throat / Coughing Reports that symptoms started x 4 days, with coughing congestion, productive cough, keeping him up at night, some headaches, nasal congestion. Home COVID test negative x 3. - Tried OTC NyQuil Cold & Flu, Cough drops PRN, Chlorospetic - Taking Meloxicam  - Needs note for work. - No history of asthma or COPD.  Denies any known or suspected exposure to person with or possibly with COVID19.  Denies any body ache, abdominal pain, diarrhea  Past Medical History:  Diagnosis Date  . Chicken pox   . Colon polyps   . H/O rubella   . History of measles   . Hyperlipidemia   . Psoriasis    Social History   Tobacco Use  . Smoking status: Never Smoker  . Smokeless tobacco: Never Used  Substance Use Topics  . Alcohol use: Yes    Comment: 1  drink  . Drug use: No    Current Outpatient Medications:  .  albuterol (VENTOLIN HFA) 108 (90 Base) MCG/ACT inhaler, Inhale 2 puffs into the lungs every 4 (four) hours as needed for wheezing or shortness of breath (cough)., Disp: 1 each, Rfl: 0 .  aspirin EC 81 MG tablet, Take 81 mg by mouth daily., Disp: , Rfl:  .  b complex vitamins capsule, Take 1 capsule by mouth daily., Disp: , Rfl:  .  benzonatate (TESSALON) 100 MG capsule, Take 1 capsule (100 mg total) by mouth 3 (three) times daily as needed for cough., Disp: 30 capsule, Rfl: 0 .  fenofibrate micronized (LOFIBRA) 67 MG capsule, Take 1 capsule (67 mg total) by mouth daily before breakfast., Disp: 90 capsule, Rfl: 3 .  fluocinonide cream (LIDEX) 0.05 %, Apply 1 application topically 2 (two) times daily as needed., Disp: , Rfl:  .  gabapentin (NEURONTIN) 300 MG capsule, Take 1 capsule (300 mg total) by mouth 2 (two) times daily., Disp: 60 capsule, Rfl: 2 .  ipratropium (ATROVENT) 0.06 % nasal spray, Place 2 sprays into both nostrils 4 (four) times daily. For up to 5-7 days then stop., Disp: 15 mL, Rfl: 0 .  Melatonin 200 MCG TABS, Take by mouth., Disp: , Rfl:  .  meloxicam (MOBIC) 15 MG tablet, Take 1 tablet (15 mg total) by mouth daily. As needed up to 1-2 weeks at a time, Disp: 90 tablet, Rfl: 1 .  Multiple Vitamin (MULTIVITAMIN WITH MINERALS) TABS tablet, Take 1 tablet by mouth daily., Disp: , Rfl:  .  niacin 500 MG tablet, Take 500 mg by mouth at bedtime., Disp: , Rfl:  .  Omega-3 Fatty Acids (FISH OIL) 1000 MG CAPS, Take 1,000 mg by mouth daily., Disp: , Rfl:  .  pravastatin (PRAVACHOL) 40 MG tablet, Take 1 tablet (40 mg total) by mouth daily., Disp: 90 tablet, Rfl: 3 .  Probiotic Product (PROBIOTIC DAILY PO), Take by mouth daily., Disp: , Rfl:  .  traMADol (ULTRAM) 50 MG tablet, Take 2 tablets (100 mg total) by mouth every 6 (six) hours as needed., Disp: 180 tablet, Rfl: 2 .  triamcinolone cream (KENALOG) 0.1 %, Apply 1 application  topically daily as needed., Disp: , Rfl:  .  acetaminophen (TYLENOL) 500 MG tablet, Take 500 mg by mouth every 6 (six) hours as needed. (Patient not taking: Reported on 08/01/2020), Disp: , Rfl:  .  augmented betamethasone dipropionate (DIPROLENE-AF) 0.05 % cream, , Disp: , Rfl:   Depression screen [facility] 2/9 10/28/2019 11/13/2018 06/06/2018  Decreased Interest 0 0 0  Down, Depressed, Hopeless 0 0 0  PHQ - 2 Score 0 0 0    No flowsheet data found.  -------------------------------------------------------------------------- O: No physical exam performed due to remote telephone encounter.  Lab results reviewed.  No results found for this or any previous visit (from the past 2160 hour(s)).  -------------------------------------------------------------------------- A&P:  Problem List Items Addressed This Visit   None   Visit Diagnoses    Acute non-recurrent maxillary sinusitis    -  Primary   Relevant Medications   ipratropium (ATROVENT) 0.06 % nasal spray   benzonatate (TESSALON) 100 MG capsule   Pharyngitis, unspecified etiology       Relevant Medications   ipratropium (ATROVENT) 0.06 % nasal spray   Cough due to bronchospasm       Relevant Medications   ipratropium (ATROVENT) 0.06 % nasal spray   benzonatate (TESSALON) 100 MG capsule   albuterol (VENTOLIN HFA) 108 (90 Base) MCG/ACT inhaler     Consistent with acute maxillary rhinosinusitis with pharyngitis, likely initially viral URI Onset within 4 days Sick contacts COVID Negative x 3 home test  Plan: 1. Reassurance, likely self-limited - no indication for antibiotics at this time 2. Start Atrovent nasal spray decongestant 2 sprays in each nostril up to 4 times daily for 7 days 3. Start Tessalon Perls take 1 capsule up to 3 times a day as needed for cough 4. Add mucinex 5. May take Meloxicam vs Ibuprofen as advised Return criteria reviewed 48-72 hours if not improved consider antibiotic or other evaluation.   Meds ordered  this encounter  Medications  . ipratropium (ATROVENT) 0.06 % nasal spray    Sig: Place 2 sprays into both nostrils 4 (four) times daily. For up to 5-7 days then stop.    Dispense:  15 mL    Refill:  0  . benzonatate (TESSALON) 100 MG capsule    Sig: Take 1 capsule (100 mg total) by mouth 3 (three) times daily as needed for cough.    Dispense:  30 capsule    Refill:  0  . albuterol (VENTOLIN HFA) 108 (90 Base) MCG/ACT inhaler    Sig: Inhale 2 puffs into the lungs every 4 (four) hours as needed for wheezing or shortness of breath (cough).    Dispense:  1 each    Refill:  0    Follow-up: - Return 3 days as needed if not improved  Patient verbalizes understanding with the above medical recommendations including the limitation of remote medical  advice.  Specific follow-up and call-back criteria were given for patient to follow-up or seek medical care more urgently if needed.   - Time spent in direct consultation with patient on phone: 10 minutes   Saralyn Pilar, DO [facility] 08/01/2020, 3:48 PM

## 2020-08-02 ENCOUNTER — Other Ambulatory Visit: Payer: Self-pay

## 2020-08-02 ENCOUNTER — Encounter: Payer: Self-pay | Admitting: Family Medicine

## 2020-08-02 ENCOUNTER — Telehealth (INDEPENDENT_AMBULATORY_CARE_PROVIDER_SITE_OTHER): Payer: Self-pay | Admitting: Family Medicine

## 2020-08-02 DIAGNOSIS — Z538 Procedure and treatment not carried out for other reasons: Secondary | ICD-10-CM

## 2020-08-03 ENCOUNTER — Encounter: Payer: Self-pay | Admitting: Family Medicine

## 2020-08-03 NOTE — Progress Notes (Signed)
Patient not evaluated or treated today. This was a duplicate scheduled virtual appointment. He was treated yesterday on 08/01/20.

## 2020-08-20 ENCOUNTER — Other Ambulatory Visit: Payer: Self-pay | Admitting: Anesthesiology

## 2020-08-20 DIAGNOSIS — M542 Cervicalgia: Secondary | ICD-10-CM

## 2020-08-20 DIAGNOSIS — G8929 Other chronic pain: Secondary | ICD-10-CM

## 2020-08-23 ENCOUNTER — Other Ambulatory Visit: Payer: Self-pay | Admitting: Family Medicine

## 2020-08-23 DIAGNOSIS — J9801 Acute bronchospasm: Secondary | ICD-10-CM

## 2020-08-23 DIAGNOSIS — J029 Acute pharyngitis, unspecified: Secondary | ICD-10-CM

## 2020-08-23 DIAGNOSIS — J0100 Acute maxillary sinusitis, unspecified: Secondary | ICD-10-CM

## 2020-08-23 NOTE — Telephone Encounter (Signed)
Patient called, left VM to return the call to the office if he requested the refill. It was noted on last refill to stop use after 5-7 days.

## 2020-08-23 NOTE — Telephone Encounter (Signed)
Requested medication (s) are due for refill today: Yes  Requested medication (s) are on the active medication list: Yes  Last refill:  08/01/20  Future visit scheduled: Yes  Notes to clinic:  Unsure if provider wants to continue, noted to stop after 5-7 days, left VM with patient to return if needing this refill     Requested Prescriptions  Pending Prescriptions Disp Refills   ipratropium (ATROVENT) 0.06 % nasal spray [Pharmacy Med Name: IPRATROPIUM 0.06% SPRAY]      Sig: Place 2 sprays into both nostrils 4 (four) times daily. For up to 5-7 days then stop.      Off-Protocol Failed - 08/23/2020  1:34 PM      Failed - Medication not assigned to a protocol, review manually.      Passed - Valid encounter within last 12 months    Recent Outpatient Visits           3 weeks ago Appointment canceled by hospital   [facility] Smitty Cords, DO   3 weeks ago Acute non-recurrent maxillary sinusitis   [facility], Netta Neat, DO   10 months ago Annual physical exam   [facility] Smitty Cords, DO   1 year ago Left axillary hidradenitis   [facility] Smitty Cords, DO   3 years ago Annual physical exam   [facility] Smitty Cords, DO       Future Appointments             In 2 months Althea Charon, Netta Neat, DO [facility], [facility]            Off-Protocol Failed - 08/23/2020  1:34 PM      Failed - Medication not assigned to a protocol, review manually.      Passed - Valid encounter within last 12 months    Recent Outpatient Visits           3 weeks ago Appointment canceled by hospital   [facility] Smitty Cords, DO   3 weeks ago Acute non-recurrent maxillary sinusitis   [facility], Netta Neat, DO   10 months ago Annual physical exam   [facility]  Smitty Cords, DO   1 year ago Left axillary hidradenitis   [facility] Smitty Cords, DO   3 years ago Annual physical exam   [facility] Smitty Cords, DO       Future Appointments             In 2 months Althea Charon, Netta Neat, DO [facility] [place] [facility], [facility]

## 2020-08-25 ENCOUNTER — Ambulatory Visit: Payer: Self-pay | Admitting: *Deleted

## 2020-08-25 DIAGNOSIS — U071 COVID-19: Secondary | ICD-10-CM

## 2020-08-25 NOTE — Addendum Note (Signed)
Addended by: Smitty Cords on: 08/25/2020 07:12 PM   Modules accepted: Orders

## 2020-08-25 NOTE — Telephone Encounter (Signed)
I returned pt's call.   He tested positive for covid.  Due to being overweight he is wondering if he would be a candidate for the oral antiviral therapy or monoclonal antibody infusion.  I let him know I would send his message to Dr. Althea Charon high priority and someone would get back with him  He was agreeable to this plan.  I sent my notes to [facility] for Dr. Althea Charon.    He can be reached at [phone].   Reason for Disposition . [1] HIGH RISK for severe COVID complications (e.g., weak immune system, age > 64 years, obesity with BMI > 25, pregnant, chronic lung disease or other chronic medical condition) AND [2] COVID symptoms (e.g., cough, fever)  (Exceptions: Already seen by PCP and no new or worsening symptoms.)    Overweight, asking about taking the oral antiviral medication.  Answer Assessment - Initial Assessment Questions 1. COVID-19 DIAGNOSIS: "Who made your COVID-19 diagnosis?" "Was it confirmed by a positive lab test or self-test?" If not diagnosed by a doctor (or NP/PA), ask "Are there lots of cases (community spread) where you live?" Note: See public health department website, if unsure.     I'm positive for covid.    He talked with a nurse from [facility] this morning who went over the quarantine protocol, being contagious, family protection and testing and when he could return to work 09/02/2020. 2. COVID-19 EXPOSURE: "Was there any known exposure to COVID before the symptoms began?" CDC Definition of close contact: within 6 feet (2 meters) for a total of 15 minutes or more over a 24-hour period.      Not asked 3. ONSET: "When did the COVID-19 symptoms start?"      Tuesday afternoon 4. WORST SYMPTOM: "What is your worst symptom?" (e.g., cough, fever, shortness of breath, muscle aches)     Having chills, congestion and a headache. 5. COUGH: "Do you have a cough?" If Yes, ask: "How bad is the cough?"        6. FEVER: "Do you have a fever?" If Yes, ask:  "What is your temperature, how was it measured, and when did it start?"     No 7. RESPIRATORY STATUS: "Describe your breathing?" (e.g., shortness of breath, wheezing, unable to speak)      No 8. BETTER-SAME-WORSE: "Are you getting better, staying the same or getting worse compared to yesterday?"  If getting worse, ask, "In what way?"     same 9. HIGH RISK DISEASE: "Do you have any chronic medical problems?" (e.g., asthma, heart or lung disease, weak immune system, obesity, etc.)     I'm overweight and wondering if I'm a candidate for the oral antivirals? 10. VACCINE: "Have you had the COVID-19 vaccine?" If Yes, ask: "Which one, how many shots, when did you get it?"       Did not ask 11. BOOSTER: "Have you received your COVID-19 booster?" If Yes, ask: "Which one and when did you get it?"       Did not ask 12. PREGNANCY: "Is there any chance you are pregnant?" "When was your last menstrual period?"       N/A 13. OTHER SYMPTOMS: "Do you have any other symptoms?"  (e.g., chills, fatigue, headache, loss of smell or taste, muscle pain, sore throat)       Chills, headache, congestion and feel terrible 14. O2 SATURATION MONITOR:  "Do you use an oxygen saturation monitor (pulse oximeter) at home?" If Yes, ask "What is  your reading (oxygen level) today?" "What is your usual oxygen saturation reading?" (e.g., 95%)       No  Protocols used: CORONAVIRUS (COVID-19) DIAGNOSED OR SUSPECTED-A-AH

## 2020-08-25 NOTE — Telephone Encounter (Signed)
His only risk factors are age >66 and obesity, but based on his BMI he is not in a high risk category.  His symptoms started on Tuesday.  He may be eligible. I can refer him to the COVID Treatment team.  Saralyn Pilar, DO [facility] 08/25/2020, 7:12 PM

## 2020-08-26 ENCOUNTER — Other Ambulatory Visit: Payer: Self-pay

## 2020-08-26 ENCOUNTER — Other Ambulatory Visit: Payer: Self-pay | Admitting: Physician Assistant

## 2020-08-26 MED ORDER — MOLNUPIRAVIR EUA 200MG CAPSULE
4.0000 | ORAL_CAPSULE | Freq: Two times a day (BID) | ORAL | 0 refills | Status: AC
  Filled 2020-08-26: qty 40, 5d supply, fill #0

## 2020-08-26 MED ORDER — CARESTART COVID-19 HOME TEST VI KIT
PACK | 0 refills | Status: DC
  Filled 2020-08-26: qty 2, 4d supply, fill #0

## 2020-08-26 NOTE — Telephone Encounter (Signed)
Pt. Given PCP's message. Verbalizes understanding.

## 2020-08-26 NOTE — Progress Notes (Signed)
Outpatient Oral COVID Treatment Note  I connected with Isaiah Johnson on 08/26/2020/11:20 AM by telephone and verified that I am speaking with the correct person using two identifiers.  I discussed the limitations, risks, security, and privacy concerns of performing an evaluation and management service by telephone and the availability of in person appointments. I also discussed with the patient that there may be a patient responsible charge related to this service. The patient expressed understanding and agreed to proceed.  Patient location:Home Provider location: Home  Diagnosis: COVID-19 infection  Purpose of visit: Discussion of potential use of Molnupiravir or Paxlovid, a new treatment for mild to moderate COVID-19 viral infection in non-hospitalized patients.   Subjective: Patient is a 55 y.o. male who has been diagnosed with COVID 19 viral infection.  Their symptoms began on 08/23/20 with sore throat, chills, congestion and fever.   Past Medical History:  Diagnosis Date  . Chicken pox   . Colon polyps   . H/O rubella   . History of measles   . Hyperlipidemia   . Psoriasis     No Known Allergies   Current Outpatient Medications:  .  acetaminophen (TYLENOL) 500 MG tablet, Take 500 mg by mouth every 6 (six) hours as needed. (Patient not taking: Reported on 08/01/2020), Disp: , Rfl:  .  albuterol (VENTOLIN HFA) 108 (90 Base) MCG/ACT inhaler, Inhale 2 puffs into the lungs every 4 (four) hours as needed for wheezing or shortness of breath (cough)., Disp: 1 each, Rfl: 0 .  aspirin EC 81 MG tablet, Take 81 mg by mouth daily., Disp: , Rfl:  .  augmented betamethasone dipropionate (DIPROLENE-AF) 0.05 % cream, , Disp: , Rfl:  .  b complex vitamins capsule, Take 1 capsule by mouth daily., Disp: , Rfl:  .  benzonatate (TESSALON) 100 MG capsule, Take 1 capsule (100 mg total) by mouth 3 (three) times daily as needed for cough., Disp: 30 capsule, Rfl: 0 .  fenofibrate micronized (LOFIBRA) 67 MG  capsule, Take 1 capsule (67 mg total) by mouth daily before breakfast., Disp: 90 capsule, Rfl: 3 .  fluocinonide cream (LIDEX) 0.05 %, Apply 1 application topically 2 (two) times daily as needed., Disp: , Rfl:  .  gabapentin (NEURONTIN) 300 MG capsule, Take 1 capsule (300 mg total) by mouth 2 (two) times daily., Disp: 60 capsule, Rfl: 2 .  ipratropium (ATROVENT) 0.06 % nasal spray, Place 2 sprays into both nostrils 4 (four) times daily. For up to 5-7 days then stop., Disp: 15 mL, Rfl: 0 .  Melatonin 200 MCG TABS, Take by mouth., Disp: , Rfl:  .  meloxicam (MOBIC) 15 MG tablet, Take 1 tablet (15 mg total) by mouth daily. As needed up to 1-2 weeks at a time, Disp: 90 tablet, Rfl: 1 .  Multiple Vitamin (MULTIVITAMIN WITH MINERALS) TABS tablet, Take 1 tablet by mouth daily., Disp: , Rfl:  .  niacin 500 MG tablet, Take 500 mg by mouth at bedtime., Disp: , Rfl:  .  Omega-3 Fatty Acids (FISH OIL) 1000 MG CAPS, Take 1,000 mg by mouth daily., Disp: , Rfl:  .  pravastatin (PRAVACHOL) 40 MG tablet, Take 1 tablet (40 mg total) by mouth daily., Disp: 90 tablet, Rfl: 3 .  Probiotic Product (PROBIOTIC DAILY PO), Take by mouth daily., Disp: , Rfl:  .  triamcinolone cream (KENALOG) 0.1 %, Apply 1 application topically daily as needed., Disp: , Rfl:   Objective: Patient appears/sounds sick.  They are in no apparent distress.  Breathing  is non labored.  Mood and behavior are normal.  Laboratory Data:  No results found for this or any previous visit (from the past 2160 hour(s)).   Assessment: 55 y.o. male with mild/moderate COVID 19 viral infection diagnosed on 08/25/20 at high risk for progression to severe COVID 19.  Plan:  This patient is a 55 y.o. male that meets the following criteria for Emergency Use Authorization of: Molnupiravir  1. Age >18 yr 2. SARS-COV-2 positive test 3. Symptom onset < 5 days 4. Mild-to-moderate COVID disease with high risk for severe progression to hospitalization or death   I  have spoken and communicated the following to the patient or parent/caregiver regarding: 1. Molnupiravir is an unapproved drug that is authorized for use under an TEFL teacher.  2. There are no adequate, approved, available products for the treatment of COVID-19 in adults who have mild-to-moderate COVID-19 and are at high risk for progressing to severe COVID-19, including hospitalization or death. 3. Other therapeutics are currently authorized. For additional information on all products authorized for treatment or prevention of COVID-19, please see https://www.graham-miller.com/.  4. There are benefits and risks of taking this treatment as outlined in the "Fact Sheet for Patients and Caregivers."  5. "Fact Sheet for Patients and Caregivers" was reviewed with patient. A hard copy will be provided to patient from pharmacy prior to the patient receiving treatment. 6. Patients should continue to self-isolate and use infection control measures (e.g., wear mask, isolate, social distance, avoid sharing personal items, clean and disinfect "high touch" surfaces, and frequent handwashing) according to CDC guidelines.  7. The patient or parent/caregiver has the option to accept or refuse treatment. 8. [employer] has established a pregnancy surveillance program. 9. Females of childbearing potential should use a reliable method of contraception correctly and consistently, as applicable, for the duration of treatment and for 4 days after the last dose of Molnupiravir. 10. Males of reproductive potential who are sexually active with females of childbearing potential should use a reliable method of contraception correctly and consistently during treatment and for at least 3 months after the last dose. 11. Pregnancy status and risk was assessed. Patient verbalized understanding of precautions.   After  reviewing above information with the patient, the patient agrees to receive molnupiravir.  Follow up instructions:    . Take prescription BID x 5 days as directed . Reach out to pharmacist for counseling on medication if desired . For concerns regarding further COVID symptoms please follow up with your PCP or urgent care . For urgent or life-threatening issues, seek care at your local emergency department  The patient was provided an opportunity to ask questions, and all were answered. The patient agreed with the plan and demonstrated an understanding of the instructions.   Script sent to [facility] and opted to pick up RX.  The patient was advised to call their PCP or seek an in-person evaluation if the symptoms worsen or if the condition fails to improve as anticipated.   I provided 20 minutes of non face-to-face telephone visit time during this encounter, and > 50% was spent counseling as documented under my assessment & plan.  [place], [place] 08/26/2020 /11:20 AM

## 2020-09-26 MED ORDER — CYCLOBENZAPRINE HCL 10 MG PO TABS: 10.0000 mg | ORAL_TABLET | Freq: Two times a day (BID) | ORAL | 5 refills | Status: DC

## 2020-09-26 NOTE — Addendum Note (Signed)
Addended by: Yevette Edwards on: 09/26/2020 12:27 PM   Modules accepted: Orders

## 2020-10-01 ENCOUNTER — Other Ambulatory Visit: Payer: Self-pay | Admitting: Family Medicine

## 2020-10-01 DIAGNOSIS — E7849 Other hyperlipidemia: Secondary | ICD-10-CM

## 2020-10-01 NOTE — Telephone Encounter (Signed)
Last RF 10/28/19 #90 3 RF

## 2020-10-06 IMAGING — MR MRI CERVICAL SPINE WITHOUT CONTRAST
5 series · 35 of 48 positions shown · non-contrast
Comparison: None available.

CLINICAL DATA: Initial evaluation for chronic neck and bilateral
arm pain, worse on the left.

EXAM:
MRI CERVICAL SPINE WITHOUT CONTRAST
TECHNIQUE: Multiplanar, multisequence MR imaging of the cervical spine was
performed. No intravenous contrast was administered.

[Series 5: T2 · sagittal · 3.0mm · 0.62mm/px · 6 of 15 slices shown (1 of 2)]
[im 1/15]
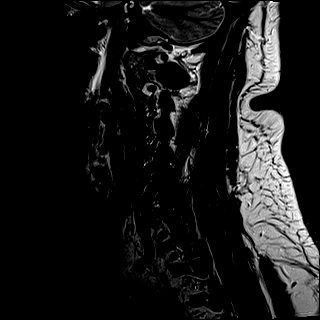
[im 3/15]
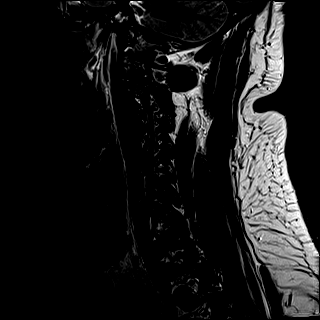
[im 6/15]
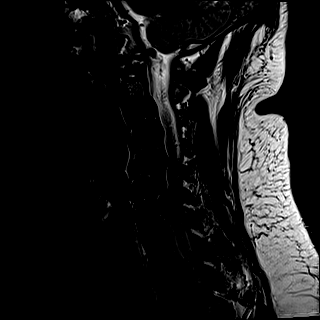
[im 9/15]
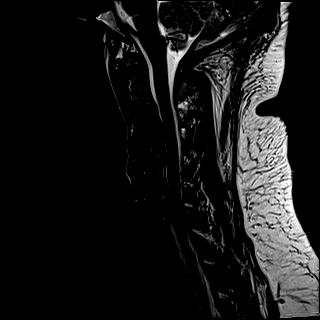
[im 12/15]
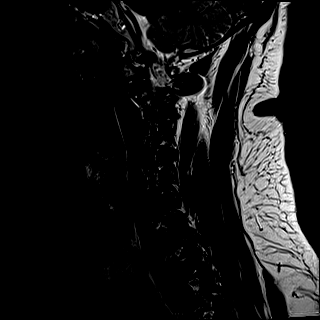
[im 15/15]
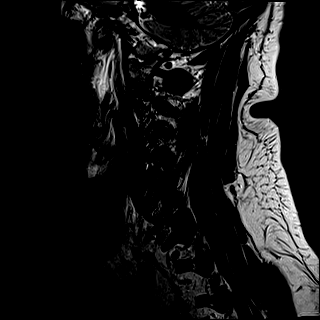

[Series 6: FLAIR · sagittal · 3.0mm · 0.78mm/px · 7 of 15 slices shown]
[im 1/15]
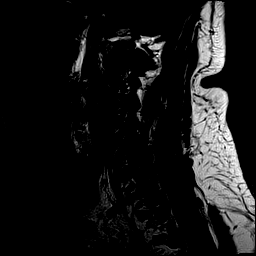
[im 3/15]
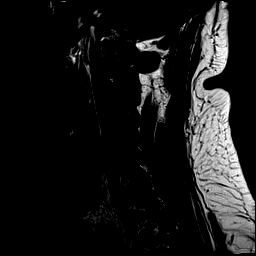
[im 5/15]
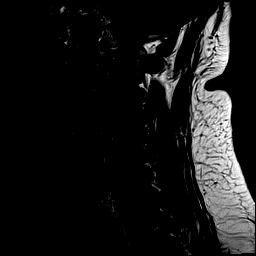
[im 8/15]
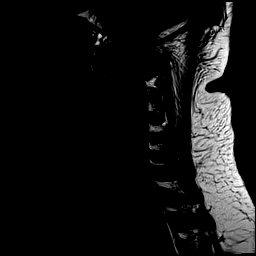
[im 10/15]
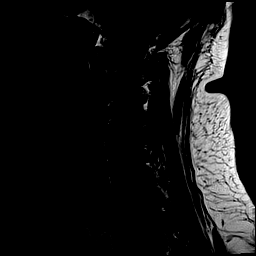
[im 12/15]
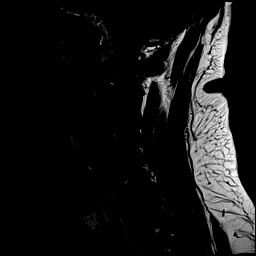
[im 15/15]
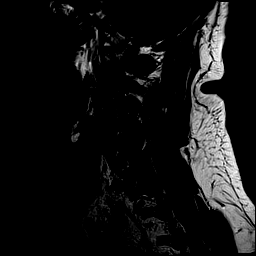

[Series 7: STIR · sagittal · 3.0mm · 0.62mm/px · 7 of 15 slices shown]
[im 1/15]
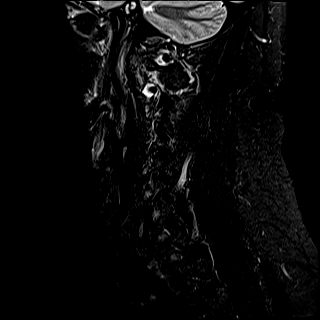
[im 3/15]
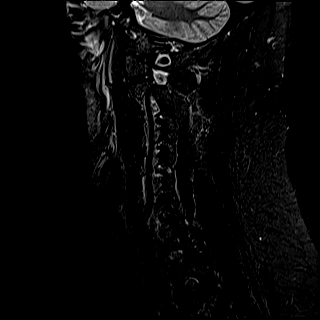
[im 5/15]
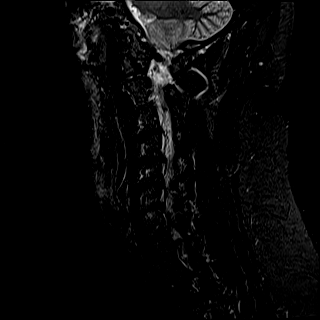
[im 8/15]
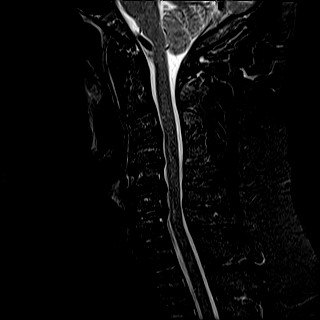
[im 10/15]
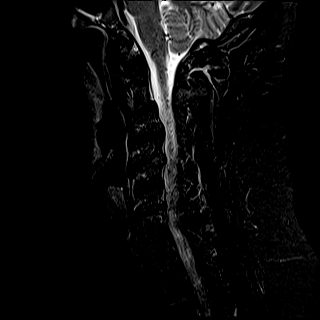
[im 12/15]
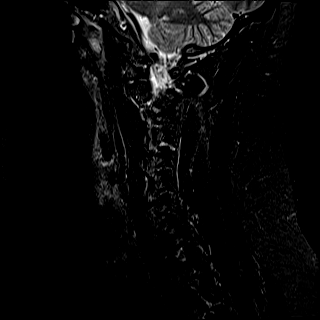
[im 15/15]
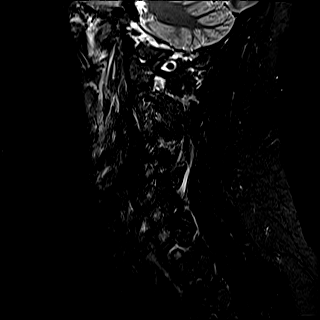

[Series 8: T2 · axial · 3.0mm · 0.70mm/px · z∈[-119,-22]mm · 8 of 29 slices shown (2 of 2)]
[im 1/29]
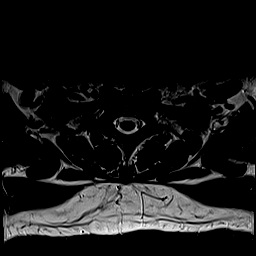
[im 5/29]
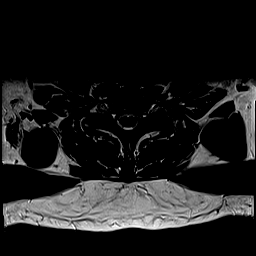
[im 9/29]
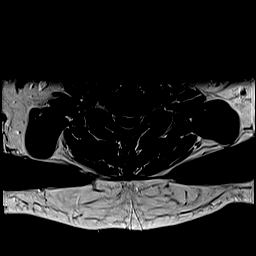
[im 13/29]
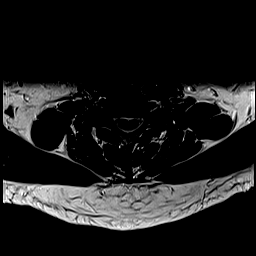
[im 16/29]
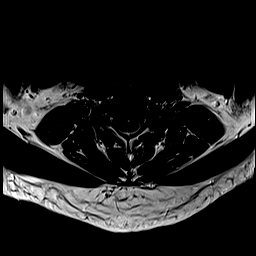
[im 20/29]
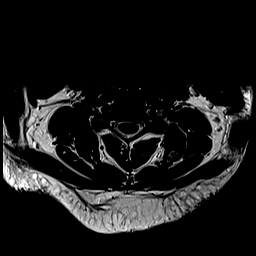
[im 24/29]
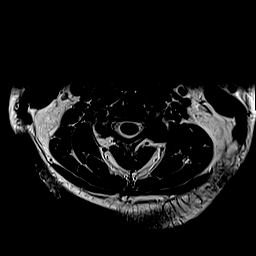
[im 29/29]
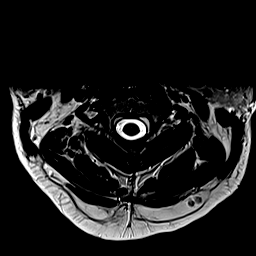

[Series 9: ax mpgr · axial · 3.0mm · 0.35mm/px · z∈[-119,-39]mm · 7 of 29 slices shown]
[im 1/29]
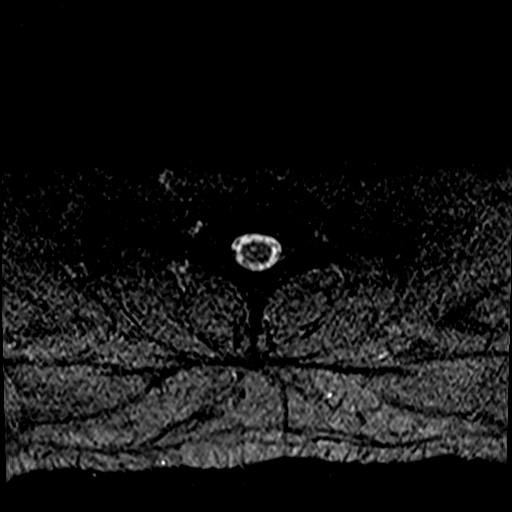
[im 5/29]
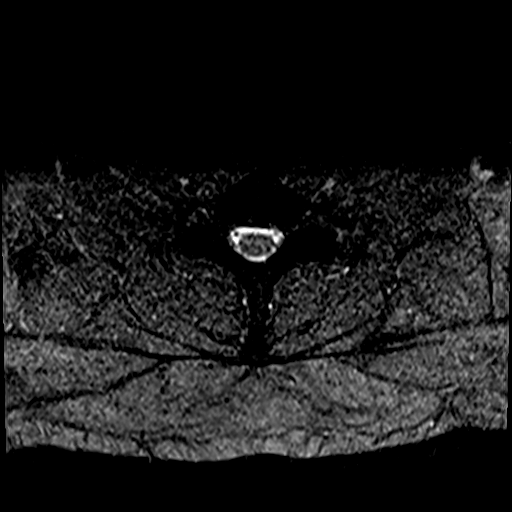
[im 9/29]
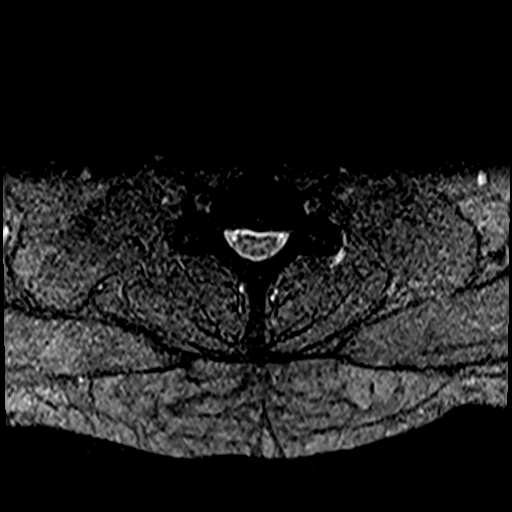
[im 13/29]
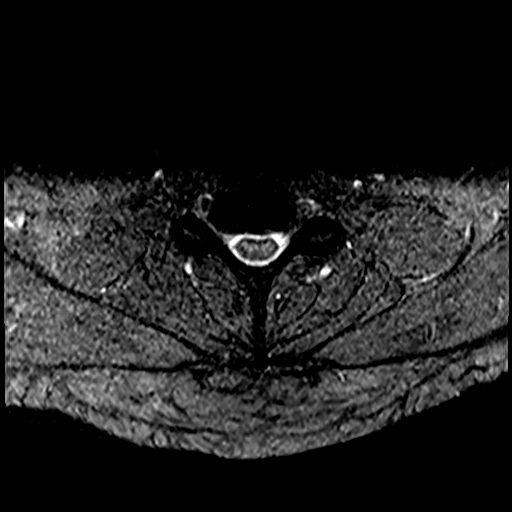
[im 16/29]
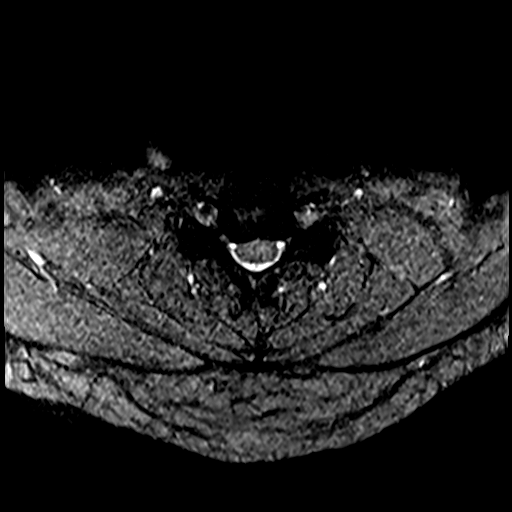
[im 20/29]
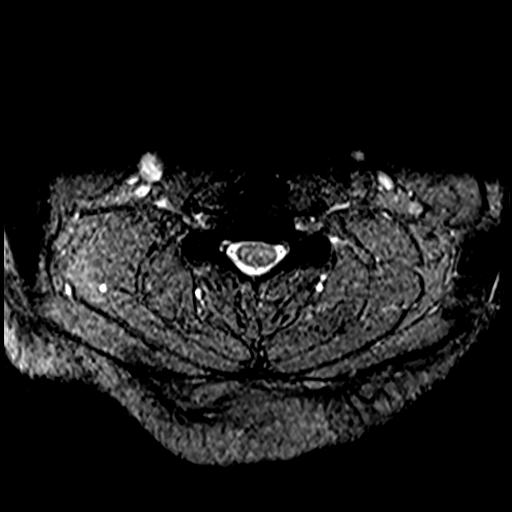
[im 24/29]
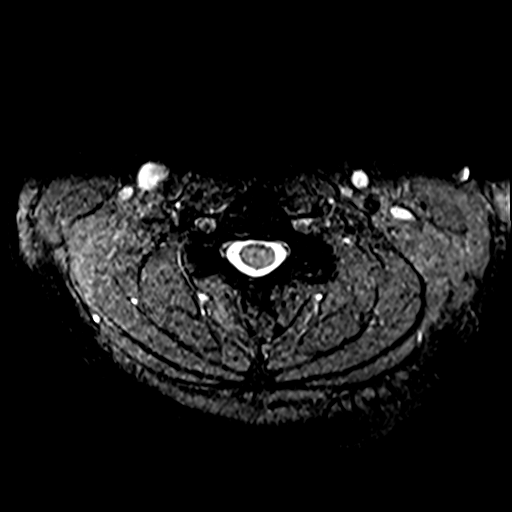

[35 of 48 positions shown; findings below may reference images not displayed]

FINDINGS: Alignment: Straightening with mild reversal of the normal cervical
lordosis. Trace anterolisthesis of C2 on C3, likely chronic and
facet mediated.

Vertebrae: Vertebral body height maintained without evidence for
acute or chronic fracture. Bone marrow signal intensity within
normal limits. No discrete or worrisome osseous lesions. Mild
reactive endplate changes present at C3-4 through C6-7. No other
abnormal marrow edema.

Cord: Signal intensity within the cervical spinal cord is normal.

Posterior Fossa, vertebral arteries, paraspinal tissues: Cerebellar
tonsils minimally low lying by approximately 2-3 mm at the foramen
magnum without frank Chiari malformation. Craniocervical junction
otherwise unremarkable. Paraspinous and prevertebral soft tissues
within normal limits. Normal intravascular flow voids seen within
the vertebral arteries bilaterally.

Disc levels:

C2-C3: Trace anterolisthesis. Minimal disc bulge with bilateral
uncovertebral hypertrophy. Advanced left with mild right facet
degeneration. Central canal remains widely patent. Moderate left C3
foraminal stenosis. No significant right foraminal narrowing.

C3-C4: Chronic intervertebral disc space narrowing with diffuse
degenerative disc osteophyte. Broad posterior component flattens and
partially faces the ventral thecal sac and results in mild spinal
stenosis. Minimal flattening of the ventral spinal cord without cord
signal changes. Superimposed left greater than right facet
hypertrophy. Moderate left with mild right C4 foraminal stenosis.

C4-C5: Chronic intervertebral disc space narrowing with diffuse
degenerative disc osteophyte. Flattening and partial effacement of
the ventral thecal sac, greater on the right. Mild spinal stenosis
with minimal flattening of the ventral right hemicord. No cord
signal changes. Superimposed mild facet hypertrophy. Severe left
with moderate right C5 foraminal narrowing.

C5-C6: Chronic intervertebral disc space narrowing with diffuse
degenerative disc osteophyte. Broad posterior component indents the
ventral thecal sac resultant mild spinal stenosis. Minimal
flattening of the ventral spinal cord without cord signal changes.
Right worse than left uncovertebral hypertrophy with resultant
severe right and moderate left C6 foraminal narrowing.

C6-C7: Chronic intervertebral disc space narrowing with diffuse
degenerative disc osteophyte. Broad posterior component flattens and
partially faces the ventral thecal sac resultant mild spinal
stenosis. Superimposed mild facet and ligament flavum hypertrophy.
Moderate bilateral C7 foraminal stenosis.

C7-T1:  Mild facet hypertrophy.  No canal or foraminal stenosis.

Visualized upper thoracic spine demonstrates no significant finding.
IMPRESSION: 1. Moderate multilevel cervical spondylolysis with resultant mild
diffuse spinal stenosis at C3-4 through C6-7.
2. Multifactorial degenerative changes with resultant multilevel
foraminal narrowing as above. Notable findings include moderate left
C3 and C4 foraminal stenosis, severe left with moderate right C5
foraminal narrowing, severe right with moderate left C6 foraminal
stenosis, with moderate bilateral C7 foraminal narrowing.

## 2020-10-18 ENCOUNTER — Other Ambulatory Visit: Payer: Self-pay | Admitting: Anesthesiology

## 2020-10-18 DIAGNOSIS — G8929 Other chronic pain: Secondary | ICD-10-CM

## 2020-10-21 ENCOUNTER — Other Ambulatory Visit: Payer: Self-pay | Admitting: Family Medicine

## 2020-10-21 DIAGNOSIS — E7849 Other hyperlipidemia: Secondary | ICD-10-CM

## 2020-11-02 ENCOUNTER — Encounter: Payer: BC Managed Care – PPO | Admitting: Family Medicine

## 2020-11-09 ENCOUNTER — Other Ambulatory Visit: Payer: Self-pay

## 2020-11-09 ENCOUNTER — Encounter: Payer: Self-pay | Admitting: Family Medicine

## 2020-11-09 ENCOUNTER — Ambulatory Visit (INDEPENDENT_AMBULATORY_CARE_PROVIDER_SITE_OTHER): Payer: No Typology Code available for payment source | Admitting: Family Medicine

## 2020-11-09 VITALS — BP 137/85 | HR 84 | Ht 69.0 in | Wt 243.0 lb

## 2020-11-09 DIAGNOSIS — E669 Obesity, unspecified: Secondary | ICD-10-CM

## 2020-11-09 DIAGNOSIS — E782 Mixed hyperlipidemia: Secondary | ICD-10-CM

## 2020-11-09 DIAGNOSIS — Z125 Encounter for screening for malignant neoplasm of prostate: Secondary | ICD-10-CM

## 2020-11-09 DIAGNOSIS — Z0000 Encounter for general adult medical examination without abnormal findings: Secondary | ICD-10-CM | POA: Diagnosis not present

## 2020-11-09 DIAGNOSIS — R7309 Other abnormal glucose: Secondary | ICD-10-CM

## 2020-11-09 DIAGNOSIS — E7849 Other hyperlipidemia: Secondary | ICD-10-CM | POA: Diagnosis not present

## 2020-11-09 MED ORDER — PRAVASTATIN SODIUM 40 MG PO TABS: 40.0000 mg | ORAL_TABLET | Freq: Every day | ORAL | 3 refills | Status: DC

## 2020-11-09 NOTE — Progress Notes (Signed)
Subjective:    Patient ID: Isaiah Johnson, male    DOB: 1965/11/19, 55 y.o.   MRN: [id number]  Isaiah Johnson is a 55 y.o. male presenting on 11/09/2020 for Annual Exam   HPI  Here for Annual Physical and due for fasting labs today.   HYPERLIPIDEMIA / Low HDL / Obesity BMI >35 - Reports no concerns. Last lipid panel 2020, mostly controlled except mild low HDL - Currently taking Pravastatin 19m and Fenofibrate 690mdaily, tolerating well without side effects or myalgias - needs refill Lifestyle - Diet: Balanced diet, still improving. - Exercise: walking exercise. - Taking ASA 81   Chronic Neck / Shoulder Pain / History of DJD Cervical  [facility] s/p cervical spinal fusion Dr YaIzora Ribas5/2021)     Health Maintenance: UTD Flu vaccine, TDap UTD routine HIV screening  UTD COVID vaccines, and boosters need copy of booster   Prostate CA Screening: Prior PSA reported normal. Prior PSA 2 - now due today for labs Currently asymptomatic. No known family history of prostate CA. Due for screening.   Colon CA Screening: Last Colonoscopy 2013 and then most recent [facility]28/17 [facility] results with polyp x 1 adenoma benign, good for 5 years, next due return 09/2022. Currently asymptomatic. Known family history of colon CA, maternal uncle age [id number] due yet for repeat.   [facility] 11/09/2020 10/28/2019 11/13/2018  Decreased Interest 0 0 0  Down, Depressed, Hopeless 0 0 0  PHQ - 2 Score 0 0 0  Altered sleeping 2 - -  Tired, decreased energy 0 - -  Change in appetite 0 - -  Feeling bad or failure about yourself  0 - -  Trouble concentrating 0 - -  Moving slowly or fidgety/restless 0 - -  Suicidal thoughts 0 - -  PHQ-9 Score 2 - -  Difficult doing work/chores Not difficult at all - -    Past Medical History:  Diagnosis Date   Chicken pox    Colon polyps    H/O rubella    History of measles     Hyperlipidemia    Psoriasis    Past Surgical History:  Procedure Laterality Date   CHOLECYSTECTOMY  1996   Social History   Socioeconomic History   Marital status: Married    Spouse name: StProofreader Number of children: Not on file   Years of education: High school   Highest education level: Not on file  Occupational History   Occupation: CaDesigner, fashion/clothingTobacco Use   Smoking status: Never   Smokeless tobacco: Never  Substance and Sexual Activity   Alcohol use: Yes    Comment: 1 drink   Drug use: No   Sexual activity: Not on file  Other Topics Concern   Not on file  Social History Narrative   Not on file   Social Determinants of Health   Financial Resource Strain: Not on file  Food Insecurity: Not on file  Transportation Needs: Not on file  Physical Activity: Not on file  Stress: Not on file  Social Connections: Not on file  Intimate Partner Violence: Not on file   Family History  Problem Relation Age of Onset   Diabetes Mother    Heart disease Mother    Heart disease Father    Depression Father    Sleep apnea Brother    Colon cancer Maternal Uncle    Prostate  cancer Neg Hx    Current Outpatient Medications on File Prior to Visit  Medication Sig   albuterol (VENTOLIN HFA) 108 (90 Base) MCG/ACT inhaler Inhale 2 puffs into the lungs every 4 (four) hours as needed for wheezing or shortness of breath (cough).   aspirin EC 81 MG tablet Take 81 mg by mouth daily.   b complex vitamins capsule Take 1 capsule by mouth daily.   COVID-19 At Home Antigen Test [facility] COVID-19 HOME TEST) KIT use as directed   cyclobenzaprine (FLEXERIL) 10 MG tablet TAKE 1 TABLET BY MOUTH EVERYDAY AT BEDTIME   fenofibrate micronized (LOFIBRA) 67 MG capsule Take 1 capsule (67 mg total) by mouth daily before breakfast.   fluocinonide cream (LIDEX) 3.97 % Apply 1 application topically 2 (two) times daily as needed.   ipratropium (ATROVENT) 0.06 % nasal spray Place 2 sprays  into both nostrils 4 (four) times daily. For up to 5-7 days then stop.   Melatonin 200 MCG TABS Take by mouth.   meloxicam (MOBIC) 15 MG tablet TAKE 1 TABLET (15 MG TOTAL) BY MOUTH DAILY. AS NEEDED UP TO 1-2 WEEKS AT A TIME   Multiple Vitamin (MULTIVITAMIN WITH MINERALS) TABS tablet Take 1 tablet by mouth daily.   niacin 500 MG tablet Take 500 mg by mouth at bedtime.   Omega-3 Fatty Acids (FISH OIL) 1000 MG CAPS Take 1,000 mg by mouth daily.   Probiotic Product (PROBIOTIC DAILY PO) Take by mouth daily.   triamcinolone cream (KENALOG) 0.1 % Apply 1 application topically daily as needed.   acetaminophen (TYLENOL) 500 MG tablet Take 500 mg by mouth every 6 (six) hours as needed. (Patient not taking: No sig reported)   augmented betamethasone dipropionate (DIPROLENE-AF) 0.05 % cream  (Patient not taking: No sig reported)   gabapentin (NEURONTIN) 300 MG capsule Take 1 capsule (300 mg total) by mouth 2 (two) times daily.   No current facility-administered medications on file prior to visit.    Review of Systems  Constitutional:  Negative for activity change, appetite change, chills, diaphoresis, fatigue and fever.  HENT:  Negative for congestion and hearing loss.   Eyes:  Negative for visual disturbance.  Respiratory:  Negative for cough, chest tightness, shortness of breath and wheezing.   Cardiovascular:  Negative for chest pain, palpitations and leg swelling.  Gastrointestinal:  Negative for abdominal pain, constipation, diarrhea, nausea and vomiting.  Genitourinary:  Negative for dysuria, frequency and hematuria.  Musculoskeletal:  Negative for arthralgias and neck pain.  Skin:  Negative for rash.  Neurological:  Negative for dizziness, weakness, light-headedness, numbness and headaches.  Hematological:  Negative for adenopathy.  Psychiatric/Behavioral:  Negative for behavioral problems, dysphoric mood and sleep disturbance.   Per HPI unless specifically indicated above      Objective:     BP 137/85 (BP Location: Left Arm, Cuff Size: Normal)   Pulse 84   Ht '5\' 9"'  (1.753 m)   Wt 243 lb (110.2 kg)   SpO2 98%   BMI 35.88 kg/m   Wt Readings from Last 3 Encounters:  11/09/20 243 lb (110.2 kg)  10/28/19 234 lb (106.1 kg)  10/06/19 230 lb (104.3 kg)    Physical Exam Vitals and nursing note reviewed.  Constitutional:      General: He is not in acute distress.    Appearance: He is well-developed. He is not diaphoretic.     Comments: Well-appearing, comfortable, cooperative  HENT:     Head: Normocephalic and atraumatic.  Eyes:  General:        Right eye: No discharge.        Left eye: No discharge.     Conjunctiva/sclera: Conjunctivae normal.     Pupils: Pupils are equal, round, and reactive to light.  Neck:     Thyroid: No thyromegaly.  Cardiovascular:     Rate and Rhythm: Normal rate and regular rhythm.     Pulses: Normal pulses.     Heart sounds: Normal heart sounds. No murmur heard. Pulmonary:     Effort: Pulmonary effort is normal. No respiratory distress.     Breath sounds: Normal breath sounds. No wheezing or rales.  Abdominal:     General: Bowel sounds are normal. There is no distension.     Palpations: Abdomen is soft. There is no mass.     Tenderness: There is no abdominal tenderness.  Musculoskeletal:        General: No tenderness. Normal range of motion.     Cervical back: Normal range of motion and neck supple.     Comments: Upper / Lower Extremities: - Normal muscle tone, strength bilateral upper extremities 5/5, lower extremities 5/5  Lymphadenopathy:     Cervical: No cervical adenopathy.  Skin:    General: Skin is warm and dry.     Findings: No erythema or rash.  Neurological:     Mental Status: He is alert and oriented to person, place, and time.     Comments: Distal sensation intact to light touch all extremities  Psychiatric:        Mood and Affect: Mood normal.        Behavior: Behavior normal.        Thought Content: Thought  content normal.     Comments: Well groomed, good eye contact, normal speech and thoughts   Results for orders placed or performed in visit on 10/28/19  Hemoglobin A1c  Result Value Ref Range   Hgb A1c MFr Bld 5.5 <5.7 % of total Hgb   Mean Plasma Glucose 111 (calc)   eAG (mmol/L) 6.2 (calc)  CBC with Differential/Platelet  Result Value Ref Range   WBC 8.9 3.8 - 10.8 Thousand/uL   RBC 5.65 4.20 - 5.80 Million/uL   Hemoglobin 16.3 13.2 - 17.1 g/dL   HCT 49.0 38.5 - 50.0 %   MCV 86.7 80.0 - 100.0 fL   MCH 28.8 27.0 - 33.0 pg   MCHC 33.3 32.0 - 36.0 g/dL   RDW 12.1 11.0 - 15.0 %   Platelets 269 140 - 400 Thousand/uL   MPV 9.6 7.5 - 12.5 fL   Neutro Abs 6,212 1,500 - 7,800 cells/uL   Lymphs Abs 1,575 850 - 3,900 cells/uL   Absolute Monocytes 445 200 - 950 cells/uL   Eosinophils Absolute 605 (H) 15 - 500 cells/uL   Basophils Absolute 62 0 - 200 cells/uL   Neutrophils Relative % 69.8 %   Total Lymphocyte 17.7 %   Monocytes Relative 5.0 %   Eosinophils Relative 6.8 %   Basophils Relative 0.7 %  COMPLETE METABOLIC PANEL WITH GFR  Result Value Ref Range   Glucose, Bld 96 65 - 99 mg/dL   BUN 15 7 - 25 mg/dL   Creat 0.78 0.70 - 1.33 mg/dL   GFR, Est Non African American 103 > OR = 60 mL/min/1.1m   GFR, Est African American 119 > OR = 60 mL/min/1.733m  BUN/Creatinine Ratio NOT APPLICABLE 6 - 22 (calc)   Sodium 139 135 - 146 mmol/L  Potassium 4.6 3.5 - 5.3 mmol/L   Chloride 103 98 - 110 mmol/L   CO2 27 20 - 32 mmol/L   Calcium 9.5 8.6 - 10.3 mg/dL   Total Protein 6.5 6.1 - 8.1 g/dL   Albumin 4.6 3.6 - 5.1 g/dL   Globulin 1.9 1.9 - 3.7 g/dL (calc)   AG Ratio 2.4 1.0 - 2.5 (calc)   Total Bilirubin 0.5 0.2 - 1.2 mg/dL   Alkaline phosphatase (APISO) 73 35 - 144 U/L   AST 18 10 - 35 U/L   ALT 22 9 - 46 U/L  Lipid panel  Result Value Ref Range   Cholesterol 169 <200 mg/dL   HDL 44 > OR = 40 mg/dL   Triglycerides 144 <150 mg/dL   LDL Cholesterol (Calc) 101 (H) mg/dL (calc)    Total CHOL/HDL Ratio 3.8 <5.0 (calc)   Non-HDL Cholesterol (Calc) 125 <130 mg/dL (calc)  PSA  Result Value Ref Range   PSA 2.0 < OR = 4.0 ng/mL  Hepatitis C antibody  Result Value Ref Range   Hepatitis C Ab NON-REACTIVE NON-REACTI   SIGNAL TO CUT-OFF 0.00 <1.00      Assessment & Plan:   Problem List Items Addressed This Visit     Obesity (BMI 35.0-39.9 without comorbidity)   Relevant Orders   COMPLETE METABOLIC PANEL WITH GFR   Lipid panel   Hyperlipidemia    Previously mostly controlled cholesterol on statin fibrate and lifestyle Last lipid panel 2021 - due today  Check lipids fasting today  Plan: 1. Continue current meds - Pravastatin 46m, Fenofibrate 67 2. Continue ASA 86mfor primary ASCVD risk reduction 3. Encourage improved lifestyle - low carb/cholesterol, reduce portion size, continue improving regular exercise       Relevant Medications   pravastatin (PRAVACHOL) 40 MG tablet   Other Visit Diagnoses     Annual physical exam    -  Primary   Relevant Orders   COMPLETE METABOLIC PANEL WITH GFR   Lipid panel   CBC with Differential/Platelet   Abnormal glucose       Relevant Orders   Hemoglobin A1c   Screening for prostate cancer       Relevant Orders   PSA       Updated Health Maintenance informatio Colonoscopy 2024 UTD Vaccines including COVID Fasting lab due today Encouraged improvement to lifestyle with diet and exercise Goal of weight loss    Meds ordered this encounter  Medications   pravastatin (PRAVACHOL) 40 MG tablet    Sig: Take 1 tablet (40 mg total) by mouth daily.    Dispense:  90 tablet    Refill:  3      Follow up plan: Return in about 1 year (around 11/09/2021) for 1 year Annual Physical and fasting lab in AM AFTER.  AlNobie PutnamDOBuchanan Damroup 11/09/2020, 8:30 AM

## 2020-11-09 NOTE — Assessment & Plan Note (Signed)
Previously mostly controlled cholesterol on statin fibrate and lifestyle Last lipid panel 2021 - due today  Check lipids fasting today  Plan: 1. Continue current meds - Pravastatin 40mg , Fenofibrate 67 2. Continue ASA 81mg  for primary ASCVD risk reduction 3. Encourage improved lifestyle - low carb/cholesterol, reduce portion size, continue improving regular exercise

## 2020-11-09 NOTE — Patient Instructions (Addendum)
Thank you for coming to the office today.  DUE for FASTING BLOOD WORK (no food or drink after midnight before the lab appointment, only water or coffee without cream/sugar on the morning of)  SCHEDULE "Lab Only" visit in the morning at the clinic for lab draw in 1 YEAR  - Make sure Lab Only appointment is at about 1 week before your next appointment, so that results will be available  For Lab Results, once available within 2-3 days of blood draw, you can can log in to MyChart online to view your results and a brief explanation. Also, we can discuss results at next follow-up visit.   Please schedule a Follow-up Appointment to: Return in about 1 year (around 11/09/2021) for 1 year Annual Physical and fasting lab in AM AFTER.  If you have any other questions or concerns, please feel free to call the office or send a message through MyChart. You may also schedule an earlier appointment if necessary.  Additionally, you may be receiving a survey about your experience at our office within a few days to 1 week by e-mail or mail. We value your feedback.  Saralyn Pilar, DO [facility], [place]

## 2020-11-10 LAB — CBC WITH DIFFERENTIAL/PLATELET
Absolute Monocytes: 408 cells/uL (ref 200–950)
Basophils Absolute: 60 cells/uL (ref 0–200)
Basophils Relative: 0.7 %
Eosinophils Absolute: 680 cells/uL — ABNORMAL HIGH (ref 15–500)
Eosinophils Relative: 8 %
HCT: 49.6 % (ref 38.5–50.0)
Hemoglobin: 15.8 g/dL (ref 13.2–17.1)
Lymphs Abs: 1947 cells/uL (ref 850–3900)
MCH: 28.1 pg (ref 27.0–33.0)
MCHC: 31.9 g/dL — ABNORMAL LOW (ref 32.0–36.0)
MCV: 88.3 fL (ref 80.0–100.0)
MPV: 9.8 fL (ref 7.5–12.5)
Monocytes Relative: 4.8 %
Neutro Abs: 5406 cells/uL (ref 1500–7800)
Neutrophils Relative %: 63.6 %
Platelets: 276 10*3/uL (ref 140–400)
RBC: 5.62 10*6/uL (ref 4.20–5.80)
RDW: 12.6 % (ref 11.0–15.0)
Total Lymphocyte: 22.9 %
WBC: 8.5 10*3/uL (ref 3.8–10.8)

## 2020-11-10 LAB — LIPID PANEL
Cholesterol: 173 mg/dL (ref <200)
HDL: 48 mg/dL (ref >=40)
LDL Cholesterol (Calc): 102 mg/dL (calc) — ABNORMAL HIGH
Non-HDL Cholesterol (Calc): 125 mg/dL (calc) (ref <130)
Total CHOL/HDL Ratio: 3.6 (calc) (ref <5.0)
Triglycerides: 124 mg/dL (ref <150)

## 2020-11-10 LAB — COMPLETE METABOLIC PANEL WITH GFR
AG Ratio: 2 (calc) (ref 1.0–2.5)
ALT: 23 U/L (ref 9–46)
AST: 17 U/L (ref 10–35)
Albumin: 4.3 g/dL (ref 3.6–5.1)
Alkaline phosphatase (APISO): 69 U/L (ref 35–144)
BUN: 15 mg/dL (ref 7–25)
CO2: 27 mmol/L (ref 20–32)
Calcium: 9.5 mg/dL (ref 8.6–10.3)
Chloride: 105 mmol/L (ref 98–110)
Creat: 0.99 mg/dL (ref 0.70–1.30)
Globulin: 2.1 g/dL (calc) (ref 1.9–3.7)
Glucose, Bld: 100 mg/dL — ABNORMAL HIGH (ref 65–99)
Potassium: 4.7 mmol/L (ref 3.5–5.3)
Sodium: 140 mmol/L (ref 135–146)
Total Bilirubin: 0.3 mg/dL (ref 0.2–1.2)
Total Protein: 6.4 g/dL (ref 6.1–8.1)
eGFR: 91 mL/min/{1.73_m2} (ref >=60)

## 2020-11-10 LAB — HEMOGLOBIN A1C
Hgb A1c MFr Bld: 5.6 % of total Hgb (ref <5.7)
Mean Plasma Glucose: 114 mg/dL
eAG (mmol/L): 6.3 mmol/L

## 2020-11-10 LAB — PSA: PSA: 2.03 ng/mL (ref <=4.00)

## 2020-12-09 ENCOUNTER — Other Ambulatory Visit: Payer: Self-pay | Admitting: Family Medicine

## 2020-12-09 ENCOUNTER — Other Ambulatory Visit: Payer: Self-pay | Admitting: Anesthesiology

## 2020-12-09 DIAGNOSIS — J029 Acute pharyngitis, unspecified: Secondary | ICD-10-CM

## 2020-12-09 DIAGNOSIS — J9801 Acute bronchospasm: Secondary | ICD-10-CM

## 2020-12-09 DIAGNOSIS — J0100 Acute maxillary sinusitis, unspecified: Secondary | ICD-10-CM

## 2020-12-09 DIAGNOSIS — E7849 Other hyperlipidemia: Secondary | ICD-10-CM

## 2020-12-09 NOTE — Telephone Encounter (Signed)
Requested medication (s) are due for refill today - Expired Rx  Requested medication (s) are on the active medication list -yes  Future visit scheduled -no  Last refill: fenofibrate- 10/28/19 #90 3RF                                    Ipratropium -08/01/20  Notes to clinic: Request RF: fenofibrate-expired Rx, ipratropium- medication not assigned protocol  Requested Prescriptions  Pending Prescriptions Disp Refills   fenofibrate micronized (LOFIBRA) 67 MG capsule [Pharmacy Med Name: FENOFIBRATE 67 MG CAPSULE] 90 capsule 3    Sig: Take 1 capsule (67 mg total) by mouth daily before breakfast.     Cardiovascular:  Antilipid - Fibric Acid Derivatives Failed - 12/09/2020 12:35 PM      Failed - LDL in normal range and within 360 days    LDL Cholesterol (Calc)  Date Value Ref Range Status  11/09/2020 102 (H) mg/dL (calc) Final    Comment:    Reference range: <100 . Desirable range <100 mg/dL for primary prevention;   <70 mg/dL for patients with CHD or diabetic patients  with > or = 2 CHD risk factors. Marland Kitchen LDL-C is now calculated using the Martin-Hopkins  calculation, which is a validated novel method providing  better accuracy than the Friedewald equation in the  estimation of LDL-C.  Cresenciano Genre et al. Annamaria Helling. 3151;761(60): 2061-2068  (http://education.[facility].com/faq/FAQ164)           Passed - Total Cholesterol in normal range and within 360 days    Cholesterol  Date Value Ref Range Status  11/09/2020 173 <200 mg/dL Final          Passed - HDL in normal range and within 360 days    HDL  Date Value Ref Range Status  11/09/2020 48 > OR = 40 mg/dL Final          Passed - Triglycerides in normal range and within 360 days    Triglycerides  Date Value Ref Range Status  11/09/2020 124 <150 mg/dL Final          Passed - ALT in normal range and within 180 days    ALT  Date Value Ref Range Status  11/09/2020 23 9 - 46 U/L Final          Passed - AST in normal range  and within 180 days    AST  Date Value Ref Range Status  11/09/2020 17 10 - 35 U/L Final          Passed - Cr in normal range and within 180 days    Creat  Date Value Ref Range Status  11/09/2020 0.99 0.70 - 1.30 mg/dL Final          Passed - eGFR in normal range and within 180 days    GFR, Est African American  Date Value Ref Range Status  10/28/2019 119 > OR = 60 mL/min/1.4m Final   GFR, Est Non African American  Date Value Ref Range Status  10/28/2019 103 > OR = 60 mL/min/1.726mFinal   eGFR  Date Value Ref Range Status  11/09/2020 91 > OR = 60 mL/min/1.7332minal    Comment:    The eGFR is based on the CKD-EPI 2021 equation. To calculate  the new eGFR from a previous Creatinine or Cystatin C result, go to https://www.kidney.org/professionals/ kdoqi/gfr%5Fcalculator  Passed - Valid encounter within last 12 months    Recent Outpatient Visits           1 month ago Annual physical exam   [place], DO   4 months ago Appointment canceled by hospital   [place], DO   4 months ago Acute non-recurrent maxillary sinusitis   [facility] [place], Devonne Doughty, DO   1 year ago Annual physical exam   [facility] [place] Haus[place], DO   2 years ago Left axillary hidradenitis   [place], DO               ipratropium (ATROVENT) 0.06 % nasal spray [Pharmacy Med Name: IPRATROPIUM 0.06% SPRAY]      Sig: Place 2 sprays into both nostrils 4 (four) times daily. For up to 5-7 days then stop.     Off-Protocol Failed - 12/09/2020 12:35 PM      Failed - Medication not assigned to a protocol, review manually.      Passed - Valid encounter within last 12 months    Recent Outpatient Visits           1 month ago Annual physical exam   [facility], DO   4  months ago Appointment canceled by hospital   [facility] [place], Devonne Doughty, DO   4 months ago Acute non-recurrent maxillary sinusitis   [facility] [place], Devonne Doughty, DO   1 year ago Annual physical exam   [facility] Olin Hauser, DO   2 years ago Left axillary hidradenitis   [facility] Olin Hauser, DO             Off-Protocol Failed - 12/09/2020 12:35 PM      Failed - Medication not assigned to a protocol, review manually.      Passed - Valid encounter within last 12 months    Recent Outpatient Visits           1 month ago Annual physical exam   [place], DO   4 months ago Appointment canceled by hospital   [facility] [place], Devonne Doughty, DO   4 months ago Acute non-recurrent maxillary sinusitis   [facility] [place], Devonne Doughty, DO   1 year ago Annual physical exam   [facility] Olin Hauser, DO   2 years ago Left axillary hidradenitis   [place], DO                 Requested Prescriptions  Pending Prescriptions Disp Refills   fenofibrate micronized (LOFIBRA) 67 MG capsule [Pharmacy Med Name: FENOFIBRATE 67 MG CAPSULE] 90 capsule 3    Sig: Take 1 capsule (67 mg total) by mouth daily before breakfast.     Cardiovascular:  Antilipid - Fibric Acid Derivatives Failed - 12/09/2020 12:35 PM      Failed - LDL in normal range and within 360 days    LDL Cholesterol (Calc)  Date Value Ref Range Status  11/09/2020 102 (H) mg/dL (calc) Final    Comment:    Reference range: <100 . Desirable range <100 mg/dL for primary prevention;   <70 mg/dL for patients with CHD or diabetic patients  with > or =  2 CHD risk factors. Marland Kitchen LDL-C is now calculated using the Martin-Hopkins  calculation, which is a validated novel  method providing  better accuracy than the Friedewald equation in the  estimation of LDL-C.  Cresenciano Genre et al. Annamaria Helling. 3300;762(26): 2061-2068  (http://education.[facility].com/faq/FAQ164)           Passed - Total Cholesterol in normal range and within 360 days    Cholesterol  Date Value Ref Range Status  11/09/2020 173 <200 mg/dL Final          Passed - HDL in normal range and within 360 days    HDL  Date Value Ref Range Status  11/09/2020 48 > OR = 40 mg/dL Final          Passed - Triglycerides in normal range and within 360 days    Triglycerides  Date Value Ref Range Status  11/09/2020 124 <150 mg/dL Final          Passed - ALT in normal range and within 180 days    ALT  Date Value Ref Range Status  11/09/2020 23 9 - 46 U/L Final          Passed - AST in normal range and within 180 days    AST  Date Value Ref Range Status  11/09/2020 17 10 - 35 U/L Final          Passed - Cr in normal range and within 180 days    Creat  Date Value Ref Range Status  11/09/2020 0.99 0.70 - 1.30 mg/dL Final          Passed - eGFR in normal range and within 180 days    GFR, Est African American  Date Value Ref Range Status  10/28/2019 119 > OR = 60 mL/min/1.38m Final   GFR, Est Non African American  Date Value Ref Range Status  10/28/2019 103 > OR = 60 mL/min/1.781mFinal   eGFR  Date Value Ref Range Status  11/09/2020 91 > OR = 60 mL/min/1.7362minal    Comment:    The eGFR is based on the CKD-EPI 2021 equation. To calculate  the new eGFR from a previous Creatinine or Cystatin C result, go to https://www.kidney.org/professionals/ kdoqi/gfr%5Fcalculator           Passed - Valid encounter within last 12 months    Recent Outpatient Visits           1 month ago [place] physical exam   [place]   4 months ago Appointment canceled by hospital   [place]   4  months ago Acute non-recurrent maxillary sinusitis   [facility]   [facility]   2 years ago Left axillary hidradenitis   [place]               ipratropium (ATROVENT) 0.06 % nasal spray [Pharmacy Med Name: IPRATROPIUM 0.06% SPRAY]      Sig: Place 2 sprays into both nostrils 4 (four) times daily. For up to 5-7 days then stop.     Off-Protocol Failed - 12/09/2020 12:35 PM      Failed - Medication not assigned to a protocol, review manually.      Passed - Valid encounter within last 12 months    Recent Outpatient Visits  1 month ago Annual physical exam   [place], DO   4 months ago Appointment canceled by hospital   [facility] [place], Devonne Doughty, DO   4 months ago Acute non-recurrent maxillary sinusitis   [facility] [place], Devonne Doughty, DO   1 year ago Annual physical exam   [facility] Olin Hauser, DO   2 years ago Left axillary hidradenitis   [facility] Olin Hauser, DO             Off-Protocol Failed - 12/09/2020 12:35 PM      Failed - Medication not assigned to a protocol, review manually.      Passed - Valid encounter within last 12 months    Recent Outpatient Visits           1 month ago Annual physical exam   [place], DO   4 months ago Appointment canceled by hospital   [place], DO   4 months ago Acute non-recurrent maxillary sinusitis   [facility] [place], Devonne Doughty, DO   1 year ago Annual physical exam   [facility] Olin Hauser, DO   2 years ago Left axillary hidradenitis   [facility] [place], Devonne Doughty, [place]

## 2020-12-19 ENCOUNTER — Other Ambulatory Visit: Payer: Self-pay | Admitting: Anesthesiology

## 2020-12-20 ENCOUNTER — Other Ambulatory Visit: Payer: Self-pay | Admitting: Family Medicine

## 2020-12-20 DIAGNOSIS — J029 Acute pharyngitis, unspecified: Secondary | ICD-10-CM

## 2020-12-20 DIAGNOSIS — J9801 Acute bronchospasm: Secondary | ICD-10-CM

## 2020-12-20 DIAGNOSIS — J0100 Acute maxillary sinusitis, unspecified: Secondary | ICD-10-CM

## 2020-12-20 NOTE — Telephone Encounter (Signed)
Requested by interface surescripts. Receipt confirmed by pharmacy 12/09/20 at 4:32pm.

## 2020-12-27 ENCOUNTER — Ambulatory Visit: Payer: No Typology Code available for payment source | Attending: Anesthesiology | Admitting: Anesthesiology

## 2020-12-27 ENCOUNTER — Encounter: Payer: Self-pay | Admitting: Anesthesiology

## 2020-12-27 ENCOUNTER — Other Ambulatory Visit: Payer: Self-pay

## 2020-12-27 DIAGNOSIS — M47812 Spondylosis without myelopathy or radiculopathy, cervical region: Secondary | ICD-10-CM

## 2020-12-27 DIAGNOSIS — M5412 Radiculopathy, cervical region: Secondary | ICD-10-CM

## 2020-12-27 DIAGNOSIS — M542 Cervicalgia: Secondary | ICD-10-CM

## 2020-12-27 DIAGNOSIS — G8929 Other chronic pain: Secondary | ICD-10-CM

## 2020-12-27 DIAGNOSIS — M5030 Other cervical disc degeneration, unspecified cervical region: Secondary | ICD-10-CM | POA: Diagnosis not present

## 2020-12-27 DIAGNOSIS — R29898 Other symptoms and signs involving the musculoskeletal system: Secondary | ICD-10-CM

## 2020-12-27 MED ORDER — CYCLOBENZAPRINE HCL 10 MG PO TABS: 10.0000 mg | ORAL_TABLET | Freq: Two times a day (BID) | ORAL | 5 refills | Status: DC

## 2020-12-27 MED ORDER — GABAPENTIN 300 MG PO CAPS: 300.0000 mg | ORAL_CAPSULE | Freq: Two times a day (BID) | ORAL | 2 refills | Status: DC

## 2020-12-27 MED ORDER — TRAMADOL HCL 50 MG PO TABS: 100.0000 mg | ORAL_TABLET | Freq: Three times a day (TID) | ORAL | 2 refills | Status: AC

## 2020-12-27 MED ORDER — MELOXICAM 15 MG PO TABS: 15.0000 mg | ORAL_TABLET | Freq: Every day | ORAL | 5 refills | Status: DC

## 2020-12-27 NOTE — Progress Notes (Signed)
Virtual Visit via Telephone Note  I connected with Isaiah Johnson on 12/27/20 at  4:20 PM EDT by telephone and verified that I am speaking with the correct person using two identifiers.  Location: Patient: Home Provider: Pain control center   I discussed the limitations, risks, security and privacy concerns of performing an evaluation and management service by telephone and the availability of in person appointments. I also discussed with the patient that there may be a patient responsible charge related to this service. The patient expressed understanding and agreed to proceed.   History of Present Illness: I spoke today via telephone today.  We were unable to do the video portion of virtual conference he reports he is having a fair amount of left side posterior neck pain and spasming.  He feels that overall the ring finger and little finger dysesthesia and pain he was having in his right hand is better following the anterior cervical disc fusion.  He is doing some physical therapy but recently was pushing his mother-in-law in a wheelchair and this did aggravate his neck pain.  No change in strength is noted at this time.  He is taking his medications including the meloxicam gabapentin tramadol and Flexeril as prescribed and this combination is working well for him.  Especially he feels that the tramadol keeps his pain under good control when he takes 2 tablets in the morning afternoon and generally in the evening.  No other side effects are noted and no other changes in upper extremity strength noted.  Review of systems: General: No fevers or chills Pulmonary: No shortness of breath or dyspnea Cardiac: No angina or palpitations or lightheadedness GI: No abdominal pain or constipation Psych: No depression    Observations/Objective:  Current Outpatient Medications:    traMADol (ULTRAM) 50 MG tablet, Take 2 tablets (100 mg total) by mouth 3 (three) times daily., Disp: 180 tablet, Rfl: 2    acetaminophen (TYLENOL) 500 MG tablet, Take 500 mg by mouth every 6 (six) hours as needed. (Patient not taking: No sig reported), Disp: , Rfl:    albuterol (VENTOLIN HFA) 108 (90 Base) MCG/ACT inhaler, Inhale 2 puffs into the lungs every 4 (four) hours as needed for wheezing or shortness of breath (cough)., Disp: 1 each, Rfl: 0   aspirin EC 81 MG tablet, Take 81 mg by mouth daily., Disp: , Rfl:    augmented betamethasone dipropionate (DIPROLENE-AF) 0.05 % cream, , Disp: , Rfl:    b complex vitamins capsule, Take 1 capsule by mouth daily., Disp: , Rfl:    COVID-19 At Home Antigen Test (CARESTART COVID-19 HOME TEST) KIT, use as directed, Disp: 2 kit, Rfl: 0   cyclobenzaprine (FLEXERIL) 10 MG tablet, Take 1 tablet (10 mg total) by mouth 2 (two) times daily., Disp: 60 tablet, Rfl: 5   fenofibrate micronized (LOFIBRA) 67 MG capsule, TAKE 1 CAPSULE (67 MG TOTAL) BY MOUTH DAILY BEFORE BREAKFAST., Disp: 90 capsule, Rfl: 3   fluocinonide cream (LIDEX) 7.09 %, Apply 1 application topically 2 (two) times daily as needed., Disp: , Rfl:    gabapentin (NEURONTIN) 300 MG capsule, Take 1 capsule (300 mg total) by mouth 2 (two) times daily., Disp: 60 capsule, Rfl: 2   ipratropium (ATROVENT) 0.06 % nasal spray, PLACE 2 SPRAYS INTO BOTH NOSTRILS 4 (FOUR) TIMES DAILY. FOR UP TO 5-7 DAYS THEN STOP., Disp: 15 mL, Rfl: 2   Melatonin 200 MCG TABS, Take by mouth., Disp: , Rfl:    meloxicam (MOBIC) 15 MG tablet,  Take 1 tablet (15 mg total) by mouth daily. As needed up to 1-2 weeks at a time, Disp: 30 tablet, Rfl: 5   Multiple Vitamin (MULTIVITAMIN WITH MINERALS) TABS tablet, Take 1 tablet by mouth daily., Disp: , Rfl:    niacin 500 MG tablet, Take 500 mg by mouth at bedtime., Disp: , Rfl:    Omega-3 Fatty Acids (FISH OIL) 1000 MG CAPS, Take 1,000 mg by mouth daily., Disp: , Rfl:    pravastatin (PRAVACHOL) 40 MG tablet, Take 1 tablet (40 mg total) by mouth daily., Disp: 90 tablet, Rfl: 3   Probiotic Product (PROBIOTIC DAILY  PO), Take by mouth daily., Disp: , Rfl:    triamcinolone cream (KENALOG) 0.1 %, Apply 1 application topically daily as needed., Disp: , Rfl:    Assessment and Plan:  1. Cervicalgia   2. Chronic neck pain   3. Cervical radicular pain   4. DDD (degenerative disc disease), cervical   5. Cervical facet syndrome   6. Upper extremity weakness   Based on our discussion today I think is appropriate to continue his tramadol as this is working well for him.  I also refilled his Flexeril gabapentin and meloxicam for 15 mg once a day.  I want him to continue stretching exercises and I have advocated for a TENS unit and some ice periodically placed during the day to help with some of the spasm.  It is not better in a month we will plan on trigger point injection to see if this can help alleviate some of the spasming component and inflammation.  I have asked him to avoid certain activities such as the repetitive pushing or pulling motion which certainly is a likely culprit for his recent exacerbation.  We will schedule him for a 1 month return to clinic and have encouraged him to continue follow-up with his primary care physicians for baseline medical care. Follow Up Instructions:    I discussed the assessment and treatment plan with the patient. The patient was provided an opportunity to ask questions and all were answered. The patient agreed with the plan and demonstrated an understanding of the instructions.   The patient was advised to call back or seek an in-person evaluation if the symptoms worsen or if the condition fails to improve as anticipated.  I provided 30 minutes of non-face-to-face time during this encounter.   Molli Barrows, MD

## 2021-01-16 ENCOUNTER — Encounter: Payer: Self-pay | Admitting: General Surgery

## 2021-01-28 ENCOUNTER — Other Ambulatory Visit: Payer: Self-pay | Admitting: Anesthesiology

## 2021-02-16 ENCOUNTER — Telehealth: Payer: Self-pay | Admitting: Anesthesiology

## 2021-02-16 NOTE — Telephone Encounter (Signed)
Bubble message sent to Dr Adams 

## 2021-02-16 NOTE — Telephone Encounter (Addendum)
Left vmail on 02-10-21, did not receive until 02-15-21, not showing up on phones  Patient is calling for appt, we can shedule for mm, but dr Pernell Dupre put in discharge note for mnb, which has to be authorized,  Dr. Pernell Dupre did not put an order in for this. I looked in chart under procedures. Nothing there. Please contact Dr. Pernell Dupre to put order in for MNB, it does not have to be authorized.

## 2021-02-20 NOTE — Addendum Note (Signed)
Addended by: Yevette Edwards on: 02/20/2021 01:16 PM   Modules accepted: Orders

## 2021-02-20 NOTE — Telephone Encounter (Signed)
Patient does not need PA for a trigger point injection. Patient will be put on for regular MM and TPI can be done if needed.

## 2021-02-27 ENCOUNTER — Ambulatory Visit: Payer: No Typology Code available for payment source | Attending: Anesthesiology | Admitting: Anesthesiology

## 2021-02-27 ENCOUNTER — Other Ambulatory Visit: Payer: Self-pay

## 2021-02-27 ENCOUNTER — Encounter: Payer: Self-pay | Admitting: Anesthesiology

## 2021-02-27 VITALS — BP 147/101 | HR 105 | Temp 97.0°F | Resp 18 | Ht 69.0 in | Wt 243.0 lb

## 2021-02-27 DIAGNOSIS — G8929 Other chronic pain: Secondary | ICD-10-CM | POA: Insufficient documentation

## 2021-02-27 DIAGNOSIS — M47812 Spondylosis without myelopathy or radiculopathy, cervical region: Secondary | ICD-10-CM | POA: Diagnosis present

## 2021-02-27 DIAGNOSIS — M5030 Other cervical disc degeneration, unspecified cervical region: Secondary | ICD-10-CM | POA: Insufficient documentation

## 2021-02-27 DIAGNOSIS — M542 Cervicalgia: Secondary | ICD-10-CM | POA: Diagnosis present

## 2021-02-27 DIAGNOSIS — M5412 Radiculopathy, cervical region: Secondary | ICD-10-CM | POA: Insufficient documentation

## 2021-02-27 MED ORDER — TRAMADOL HCL 50 MG PO TABS: 50.0000 mg | ORAL_TABLET | Freq: Four times a day (QID) | ORAL | 3 refills | Status: DC

## 2021-02-27 MED ORDER — ROPIVACAINE HCL 2 MG/ML IJ SOLN
10.0000 mL | Freq: Once | INTRAMUSCULAR | Status: AC
  Administered 2021-02-27: 10 mL via EPIDURAL

## 2021-02-27 MED ORDER — ROPIVACAINE HCL 2 MG/ML IJ SOLN
INTRAMUSCULAR | Status: AC
  Filled 2021-02-27: qty 20

## 2021-02-27 MED ORDER — TRAMADOL HCL 50 MG PO TABS: 100.0000 mg | ORAL_TABLET | Freq: Four times a day (QID) | ORAL | 3 refills | Status: DC

## 2021-02-27 MED ORDER — DEXAMETHASONE SODIUM PHOSPHATE 10 MG/ML IJ SOLN
10.0000 mg | Freq: Once | INTRAMUSCULAR | Status: AC
  Administered 2021-02-27: 10 mg
  Filled 2021-02-27: qty 1

## 2021-02-27 NOTE — Progress Notes (Signed)
Subjective:  Patient ID: Isaiah Johnson, male    DOB: [birth year]  Age: 55 y.o. MRN: [id number]  CC: Neck Pain (left)   Procedure: Trigger point injection to the left trapezius muscle x3   HPI [name] presents for reevaluation.  [name] continues to have left side neck and shoulder pain which occasionally radiates into the left dorsum of his forearm.  This is worse with certain activities but not associated with any weakness or numbness.  In the past he had this on the right side but that is better following his cervical decompressive surgery.  He describes the pain as aching gnawing and unremitting.  He has been taking tramadol 2 tablets to 3 and sometimes 4 times a day with limited success.  The pain is a spasming pain and despite efforts at stretching strengthening and physical therapy continues to be unremitting.  Otherwise he is in his usual state of health.  No changes in lower extremity strength function balance are noted.  He is taking his tramadol with success and no side effects.    Outpatient Medications Prior to Visit  Medication Sig Dispense Refill   albuterol (VENTOLIN HFA) 108 (90 Base) MCG/ACT inhaler Inhale 2 puffs into the lungs every 4 (four) hours as needed for wheezing or shortness of breath (cough). 1 each 0   aspirin EC 81 MG tablet Take 81 mg by mouth daily.     b complex vitamins capsule Take 1 capsule by mouth daily.     COVID-19 At Home Antigen Test [facility] COVID-19 HOME TEST) KIT use as directed 2 kit 0   fenofibrate micronized (LOFIBRA) 67 MG capsule TAKE 1 CAPSULE (67 MG TOTAL) BY MOUTH DAILY BEFORE BREAKFAST. 90 capsule 3   fluocinonide cream (LIDEX) 0.17 % Apply 1 application topically 2 (two) times daily as needed.     gabapentin (NEURONTIN) 300 MG capsule Take 1 capsule (300 mg total) by mouth 2 (two) times daily. 60 capsule 2   ipratropium (ATROVENT) 0.06 % nasal spray PLACE 2 SPRAYS INTO BOTH NOSTRILS 4 (FOUR) TIMES DAILY. FOR UP TO 5-7 DAYS THEN STOP.  15 mL 2   Melatonin 200 MCG TABS Take by mouth.     Multiple Vitamin (MULTIVITAMIN WITH MINERALS) TABS tablet Take 1 tablet by mouth daily.     niacin 500 MG tablet Take 500 mg by mouth at bedtime.     Omega-3 Fatty Acids (FISH OIL) 1000 MG CAPS Take 1,000 mg by mouth daily.     pravastatin (PRAVACHOL) 40 MG tablet Take 1 tablet (40 mg total) by mouth daily. 90 tablet 3   Probiotic Product (PROBIOTIC DAILY PO) Take by mouth daily.     triamcinolone cream (KENALOG) 0.1 % Apply 1 application topically daily as needed.     acetaminophen (TYLENOL) 500 MG tablet Take 500 mg by mouth every 6 (six) hours as needed. (Patient not taking: Reported on 08/01/2020)     augmented betamethasone dipropionate (DIPROLENE-AF) 0.05 % cream  (Patient not taking: Reported on 03/07/2020)     No facility-administered medications prior to visit.    Review of Systems CNS: No confusion or sedation Cardiac: No angina or palpitations GI: No abdominal pain or constipation Constitutional: No nausea vomiting fevers or chills  Objective:  BP (!) 147/101   Pulse (!) 105   Temp (!) 97 F (36.1 C)   Resp 18   Ht _0  (1.753 m)   Wt 243 lb (110.2 kg)   SpO2 96%   BMI  35.88 kg/m    BP Readings from Last 3 Encounters:  02/27/21 (!) 147/101  11/09/20 137/85  10/28/19 118/85     Wt Readings from Last 3 Encounters:  02/27/21 243 lb (110.2 kg)  11/09/20 243 lb (110.2 kg)  10/28/19 234 lb (106.1 kg)     Physical Exam Pt is alert and oriented PERRL EOMI HEART IS RRR no murmur or rub LCTA no wheezing or rales MUSCULOSKELETAL reveals 3 trigger points in the mid body left trapezius.  These do radiate upon palpation into the left shoulder and down the left arm.  He has good range of motion at the atlantooccipital joint with limited pain but rotational motion does cause some neck pain.  Extension at the neck also creates some pain but no evidence of any upper extremity dysesthesia or weakness.  Labs  Lab  Results  Component Value Date   HGBA1C 5.6 11/09/2020   HGBA1C 5.5 10/28/2019   HGBA1C 5.4 05/29/2017   Lab Results  Component Value Date   LDLCALC 102 (H) 11/09/2020   CREATININE 0.99 11/09/2020    -------------------------------------------------------------------------------------------------------------------- Lab Results  Component Value Date   WBC 8.5 11/09/2020   HGB 15.8 11/09/2020   HCT 49.6 11/09/2020   PLT 276 11/09/2020   GLUCOSE 100 (H) 11/09/2020   CHOL 173 11/09/2020   TRIG 124 11/09/2020   HDL 48 11/09/2020   LDLCALC 102 (H) 11/09/2020   ALT 23 11/09/2020   AST 17 11/09/2020   NA 140 11/09/2020   K 4.7 11/09/2020   CL 105 11/09/2020   CREATININE 0.99 11/09/2020   BUN 15 11/09/2020   CO2 27 11/09/2020   PSA 2.03 11/09/2020   HGBA1C 5.6 11/09/2020    --------------------------------------------------------------------------------------------------------------------- MR CERVICAL SPINE WO CONTRAST  Result Date: 11/04/2018 CLINICAL DATA:  Initial evaluation for chronic neck and bilateral arm pain, worse on the left. EXAM: MRI CERVICAL SPINE WITHOUT CONTRAST TECHNIQUE: Multiplanar, multisequence MR imaging of the cervical spine was performed. No intravenous contrast was administered. COMPARISON:  None available. FINDINGS: Alignment: Straightening with mild reversal of the normal cervical lordosis. Trace anterolisthesis of C2 on C3, likely chronic and facet mediated. Vertebrae: Vertebral body height maintained without evidence for acute or chronic fracture. Bone marrow signal intensity within normal limits. No discrete or worrisome osseous lesions. Mild reactive endplate changes present at C3-4 through C6-7. No other abnormal marrow edema. Cord: Signal intensity within the cervical spinal cord is normal. Posterior Fossa, vertebral arteries, paraspinal tissues: Cerebellar tonsils minimally low lying by approximately 2-3 mm at the foramen magnum without frank Chiari  malformation. Craniocervical junction otherwise unremarkable. Paraspinous and prevertebral soft tissues within normal limits. Normal intravascular flow voids seen within the vertebral arteries bilaterally. Disc levels: C2-C3: Trace anterolisthesis. Minimal disc bulge with bilateral uncovertebral hypertrophy. Advanced left with mild right facet degeneration. Central canal remains widely patent. Moderate left C3 foraminal stenosis. No significant right foraminal narrowing. C3-C4: Chronic intervertebral disc space narrowing with diffuse degenerative disc osteophyte. Broad posterior component flattens and partially faces the ventral thecal sac and results in mild spinal stenosis. Minimal flattening of the ventral spinal cord without cord signal changes. Superimposed left greater than right facet hypertrophy. Moderate left with mild right C4 foraminal stenosis. C4-C5: Chronic intervertebral disc space narrowing with diffuse degenerative disc osteophyte. Flattening and partial effacement of the ventral thecal sac, greater on the right. Mild spinal stenosis with minimal flattening of the ventral right hemicord. No cord signal changes. Superimposed mild facet hypertrophy. Severe left with moderate right  C5 foraminal narrowing. C5-C6: Chronic intervertebral disc space narrowing with diffuse degenerative disc osteophyte. Broad posterior component indents the ventral thecal sac resultant mild spinal stenosis. Minimal flattening of the ventral spinal cord without cord signal changes. Right worse than left uncovertebral hypertrophy with resultant severe right and moderate left C6 foraminal narrowing. C6-C7: Chronic intervertebral disc space narrowing with diffuse degenerative disc osteophyte. Broad posterior component flattens and partially faces the ventral thecal sac resultant mild spinal stenosis. Superimposed mild facet and ligament flavum hypertrophy. Moderate bilateral C7 foraminal stenosis. C7-T1:  Mild facet hypertrophy.   No canal or foraminal stenosis. Visualized upper thoracic spine demonstrates no significant finding. IMPRESSION: 1. Moderate multilevel cervical spondylolysis with resultant mild diffuse spinal stenosis at C3-4 through C6-7. 2. Multifactorial degenerative changes with resultant multilevel foraminal narrowing as above. Notable findings include moderate left C3 and C4 foraminal stenosis, severe left with moderate right C5 foraminal narrowing, severe right with moderate left C6 foraminal stenosis, with moderate bilateral C7 foraminal narrowing. Electronically Signed   By: Jeannine Boga M.D.   On: 11/04/2018 20:16     Assessment & Plan:   Zayan was seen today for neck pain.  Diagnoses and all orders for this visit:  Cervicalgia  Chronic neck pain  Cervical radicular pain  DDD (degenerative disc disease), cervical  Cervical facet syndrome  Cervical radiculitis  Other orders -     dexamethasone (DECADRON) injection 10 mg -     ropivacaine (PF) 2 mg/mL (0.2%) (NAROPIN) injection 10 mL -     Discontinue: traMADol (ULTRAM) 50 MG tablet; Take 1 tablet (50 mg total) by mouth 4 (four) times daily. -     traMADol (ULTRAM) 50 MG tablet; Take 2 tablets (100 mg total) by mouth 4 (four) times daily.        ----------------------------------------------------------------------------------------------------------------------  Problem List Items Addressed This Visit       Unprioritized   Chronic neck pain   Relevant Medications   traMADol (ULTRAM) 50 MG tablet   Other Visit Diagnoses     Cervicalgia    -  Primary   Cervical radicular pain       DDD (degenerative disc disease), cervical       Relevant Medications   dexamethasone (DECADRON) injection 10 mg (Completed)   traMADol (ULTRAM) 50 MG tablet   Cervical facet syndrome       Cervical radiculitis              ----------------------------------------------------------------------------------------------------------------------  1. Cervicalgia With had a long discussion regarding care and I think he is a candidate for trigger point injection.  I think to do these on a every second week basis for 3 or 4 cycles would be beneficial as discussed with him today.  We will plan on his first trigger point injection today as reviewed with him.  The risks and benefits of been reviewed and all questions answered.  I want him to continue with stretching exercises as reviewed and some strengthening exercises additionally.  He is to continue with his orthotic pillow and continue with tramadol up to 2 tablets 4 times a day.  He has Flexeril available to him as well for some of the spasm.  We have also talked about TENS unit application  2. Chronic neck pain As above  3. Cervical radicular pain   4. DDD (degenerative disc disease), cervical   5. Cervical facet syndrome Continuous core neck strengthening exercises as reviewed  6. Cervical radiculitis     ----------------------------------------------------------------------------------------------------------------------  I  have changed Patriciaann Clan "Dan"'s traMADol. I am also having him maintain his multivitamin with minerals, aspirin EC, niacin, Fish Oil, fluocinonide cream, triamcinolone cream, b complex vitamins, Probiotic Product (PROBIOTIC DAILY PO), acetaminophen, Melatonin, augmented betamethasone dipropionate, albuterol, [facility] COVID-19 Home Test, pravastatin, fenofibrate micronized, ipratropium, and gabapentin. We administered dexamethasone and ropivacaine (PF) 2 mg/mL (0.2%).   Meds ordered this encounter  Medications   dexamethasone (DECADRON) injection 10 mg   ropivacaine (PF) 2 mg/mL (0.2%) (NAROPIN) injection 10 mL   DISCONTD: traMADol (ULTRAM) 50 MG tablet    Sig: Take 1 tablet (50 mg total) by mouth 4 (four) times daily.     Dispense:  120 tablet    Refill:  3   traMADol (ULTRAM) 50 MG tablet    Sig: Take 2 tablets (100 mg total) by mouth 4 (four) times daily.    Dispense:  180 tablet    Refill:  3   Patient's Medications  New Prescriptions   TRAMADOL (ULTRAM) 50 MG TABLET    Take 2 tablets (100 mg total) by mouth 4 (four) times daily.  Previous Medications   ACETAMINOPHEN (TYLENOL) 500 MG TABLET    Take 500 mg by mouth every 6 (six) hours as needed.   ALBUTEROL (VENTOLIN HFA) 108 (90 BASE) MCG/ACT INHALER    Inhale 2 puffs into the lungs every 4 (four) hours as needed for wheezing or shortness of breath (cough).   ASPIRIN EC 81 MG TABLET    Take 81 mg by mouth daily.   AUGMENTED BETAMETHASONE DIPROPIONATE (DIPROLENE-AF) 0.05 % CREAM       B COMPLEX VITAMINS CAPSULE    Take 1 capsule by mouth daily.   COVID-19 AT HOME ANTIGEN TEST ([facility] COVID-19 HOME TEST) KIT    use as directed   FENOFIBRATE MICRONIZED (LOFIBRA) 67 MG CAPSULE    TAKE 1 CAPSULE (67 MG TOTAL) BY MOUTH DAILY BEFORE BREAKFAST.   FLUOCINONIDE CREAM (LIDEX) 0.05 %    Apply 1 application topically 2 (two) times daily as needed.   GABAPENTIN (NEURONTIN) 300 MG CAPSULE    Take 1 capsule (300 mg total) by mouth 2 (two) times daily.   IPRATROPIUM (ATROVENT) 0.06 % NASAL SPRAY    PLACE 2 SPRAYS INTO BOTH NOSTRILS 4 (FOUR) TIMES DAILY. FOR UP TO 5-7 DAYS THEN STOP.   MELATONIN 200 MCG TABS    Take by mouth.   MULTIPLE VITAMIN (MULTIVITAMIN WITH MINERALS) TABS TABLET    Take 1 tablet by mouth daily.   NIACIN 500 MG TABLET    Take 500 mg by mouth at bedtime.   OMEGA-3 FATTY ACIDS (FISH OIL) 1000 MG CAPS    Take 1,000 mg by mouth daily.   PRAVASTATIN (PRAVACHOL) 40 MG TABLET    Take 1 tablet (40 mg total) by mouth daily.   PROBIOTIC PRODUCT (PROBIOTIC DAILY PO)    Take by mouth daily.   TRIAMCINOLONE CREAM (KENALOG) 0.1 %    Apply 1 application topically daily as needed.  Modified Medications   No medications on file  Discontinued Medications   No  medications on file   ----------------------------------------------------------------------------------------------------------------------  Follow-up: No follow-ups on file.  Trigger point injection: The area overlying the aforementioned trigger points were prepped with alcohol. They were then injected with a 25-gauge needle with 3cc of ropivacaine 0.2% and Decadron 3 mg at each site after negative aspiration for heme. This was performed after informed consent was obtained and risks and benefits reviewed. She tolerated this procedure without difficulty and  was convalesced and discharged to home in stable condition for follow-up as mentioned.  _0  Andree Elk, MD@   Molli Barrows, MD

## 2021-02-27 NOTE — Patient Instructions (Signed)

## 2021-02-27 NOTE — Progress Notes (Signed)
Nursing Pain Medication Assessment:  Safety precautions to be maintained throughout the outpatient stay will include: orient to surroundings, keep bed in low position, maintain call bell within reach at all times, provide assistance with transfer out of bed and ambulation.  Medication Inspection Compliance: Pill count conducted under aseptic conditions, in front of the patient. Neither the pills nor the bottle was removed from the patient's sight at any time. Once count was completed pills were immediately returned to the patient in their original bottle.  Medication: Tramadol (Ultram) Pill/Patch Count:  24 of 180 pills remain Pill/Patch Appearance: Markings consistent with prescribed medication Bottle Appearance: Standard pharmacy container. Clearly labeled. Filled Date: 05 / 14 / 2022 Last Medication intake:  Today

## 2021-03-15 ENCOUNTER — Other Ambulatory Visit: Payer: Self-pay

## 2021-03-15 ENCOUNTER — Ambulatory Visit (INDEPENDENT_AMBULATORY_CARE_PROVIDER_SITE_OTHER): Payer: No Typology Code available for payment source | Admitting: Family Medicine

## 2021-03-15 ENCOUNTER — Encounter: Payer: Self-pay | Admitting: Family Medicine

## 2021-03-15 VITALS — BP 144/100 | HR 91 | Ht 69.0 in | Wt 242.4 lb

## 2021-03-15 DIAGNOSIS — I10 Essential (primary) hypertension: Secondary | ICD-10-CM | POA: Insufficient documentation

## 2021-03-15 MED ORDER — AMLODIPINE BESYLATE 10 MG PO TABS: 10.0000 mg | ORAL_TABLET | Freq: Every day | ORAL | 3 refills | Status: DC

## 2021-03-15 NOTE — Assessment & Plan Note (Signed)
New dx Elevated BP multiple readings outside office, here and at home. No known complications    Plan:  1. START medication Amlodipine 10mg  daily - may try half tab 5mg  daily for initial 2-4 week course, if BP not optimal can inc to whole tab, review benefit risk potential side effect leg swelling 2. Encourage improved lifestyle - low sodium diet, regular exercise, limit caffeine, wt loss lifestyle - goal to control BP with lifestyle and eventually come off med 3. Continue monitor BP outside office, bring readings to next visit, if persistently >140/90 or new symptoms notify office sooner

## 2021-03-15 NOTE — Patient Instructions (Addendum)
Thank you for coming to the office today.  Start Amlodipine 10mg  daily for blood pressure, you can cut in half for 5mg  daily for now to trial if you prefer.   Check BP regularly. Few times a week.  Goal < 140/90, then the stretch goal is going to be around or under 130/80.  Try to limit some caffeine in future. But focus on DASH diet / weight first.  If interested in medicine for wt management we can reconsider.   Please schedule a Follow-up Appointment to: Return in about 4 months (around 07/14/2021) for 4 month follow-up HTN, PreDM A1c.  If you have any other questions or concerns, please feel free to call the office or send a message through MyChart. You may also schedule an earlier appointment if necessary.  Additionally, you may be receiving a survey about your experience at our office within a few days to 1 week by e-mail or mail. We value your feedback.  , DO [facility], [facility]  DASH Eating Plan DASH stands for Dietary Approaches to Stop Hypertension. The DASH eating plan is a healthy eating plan that has been shown to: Reduce high blood pressure (hypertension). Reduce your risk for type 2 diabetes, heart disease, and stroke. Help with weight loss. What are tips for following this plan? Reading food labels Check food labels for the amount of salt (sodium) per serving. Choose foods with less than 5 percent of the Daily Value of sodium. Generally, foods with less than 300 milligrams (mg) of sodium per serving fit into this eating plan. To find whole grains, look for the word "whole" as the first word in the ingredient list. Shopping Buy products labeled as "low-sodium" or "no salt added." Buy fresh foods. Avoid canned foods and pre-made or frozen meals. Cooking Avoid adding salt when cooking. Use salt-free seasonings or herbs instead of table salt or sea salt. Check with your health care provider or pharmacist before using salt  substitutes. Do not fry foods. Cook foods using healthy methods such as baking, boiling, grilling, roasting, and broiling instead. Cook with heart-healthy oils, such as olive, canola, avocado, soybean, or sunflower oil. Meal planning  Eat a balanced diet that includes: 4 or more servings of fruits and 4 or more servings of vegetables each day. Try to fill one-half of your plate with fruits and vegetables. 6-8 servings of whole grains each day. Less than 6 oz (170 g) of lean meat, poultry, or fish each day. A 3-oz (85-g) serving of meat is about the same size as a deck of cards. One egg equals 1 oz (28 g). 2-3 servings of low-fat dairy each day. One serving is 1 cup (237 mL). 1 serving of nuts, seeds, or beans 5 times each week. 2-3 servings of heart-healthy fats. Healthy fats called omega-3 fatty acids are found in foods such as walnuts, flaxseeds, fortified milks, and eggs. These fats are also found in cold-water fish, such as sardines, salmon, and mackerel. Limit how much you eat of: Canned or prepackaged foods. Food that is high in trans fat, such as some fried foods. Food that is high in saturated fat, such as fatty meat. Desserts and other sweets, sugary drinks, and other foods with added sugar. Full-fat dairy products. Do not salt foods before eating. Do not eat more than 4 egg yolks a week. Try to eat at least 2 vegetarian meals a week. Eat more home-cooked food and less restaurant, buffet, and fast food. Lifestyle When eating  at a restaurant, ask that your food be prepared with less salt or no salt, if possible. If you drink alcohol: Limit how much you use to: 0-1 drink a day for women who are not pregnant. 0-2 drinks a day for men. Be aware of how much alcohol is in your drink. In the [place] one drink equals one 12 oz bottle of beer (355 mL), one 5 oz glass of wine (148 mL), or one 1 oz glass of hard liquor (44 mL). General information Avoid eating more than 2,300 mg of salt a  day. If you have hypertension, you may need to reduce your sodium intake to 1,500 mg a day. Work with your health care provider to maintain a healthy body weight or to lose weight. Ask what an ideal weight is for you. Get at least 30 minutes of exercise that causes your heart to beat faster (aerobic exercise) most days of the week. Activities may include walking, swimming, or biking. Work with your health care provider or dietitian to adjust your eating plan to your individual calorie needs. What foods should I eat? Fruits All fresh, dried, or frozen fruit. Canned fruit in natural juice (without added sugar). Vegetables Fresh or frozen vegetables (raw, steamed, roasted, or grilled). Low-sodium or reduced-sodium tomato and vegetable juice. Low-sodium or reduced-sodium tomato sauce and tomato paste. Low-sodium or reduced-sodium canned vegetables. Grains Whole-grain or whole-wheat bread. Whole-grain or whole-wheat pasta. Brown rice. Orpah Cobb. Bulgur. Whole-grain and low-sodium cereals. Pita bread. Low-fat, low-sodium crackers. Whole-wheat flour tortillas. Meats and other proteins Skinless chicken or [place]. Ground chicken or [place]. Pork with fat trimmed off. Fish and seafood. Egg whites. Dried beans, peas, or lentils. Unsalted nuts, nut butters, and seeds. Unsalted canned beans. Lean cuts of beef with fat trimmed off. Low-sodium, lean precooked or cured meat, such as sausages or meat loaves. Dairy Low-fat (1%) or fat-free (skim) milk. Reduced-fat, low-fat, or fat-free cheeses. Nonfat, low-sodium ricotta or cottage cheese. Low-fat or nonfat yogurt. Low-fat, low-sodium cheese. Fats and oils Soft margarine without trans fats. Vegetable oil. Reduced-fat, low-fat, or light mayonnaise and salad dressings (reduced-sodium). Canola, safflower, olive, avocado, soybean, and sunflower oils. Avocado. Seasonings and condiments Herbs. Spices. Seasoning mixes without salt. Other foods Unsalted popcorn and  pretzels. Fat-free sweets. The items listed above may not be a complete list of foods and beverages you can eat. Contact a dietitian for more information. What foods should I avoid? Fruits Canned fruit in a light or heavy syrup. Fried fruit. Fruit in cream or butter sauce. Vegetables Creamed or fried vegetables. Vegetables in a cheese sauce. Regular canned vegetables (not low-sodium or reduced-sodium). Regular canned tomato sauce and paste (not low-sodium or reduced-sodium). Regular tomato and vegetable juice (not low-sodium or reduced-sodium). Rosita Fire. Olives. Grains Baked goods made with fat, such as croissants, muffins, or some breads. Dry pasta or rice meal packs. Meats and other proteins Fatty cuts of meat. Ribs. Fried meat. Tomasa Blase. Bologna, salami, and other precooked or cured meats, such as sausages or meat loaves. Fat from the back of a pig (fatback). Bratwurst. Salted nuts and seeds. Canned beans with added salt. Canned or smoked fish. Whole eggs or egg yolks. Chicken or [place] with skin. Dairy Whole or 2% milk, cream, and half-and-half. Whole or full-fat cream cheese. Whole-fat or sweetened yogurt. Full-fat cheese. Nondairy creamers. Whipped toppings. Processed cheese and cheese spreads. Fats and oils Butter. Stick margarine. Lard. Shortening. Ghee. Bacon fat. Tropical oils, such as coconut, palm kernel, or palm oil. Seasonings and condiments  Onion salt, garlic salt, seasoned salt, table salt, and sea salt. Worcestershire sauce. Tartar sauce. Barbecue sauce. Teriyaki sauce. Soy sauce, including reduced-sodium. Steak sauce. Canned and packaged gravies. Fish sauce. Oyster sauce. Cocktail sauce. Store-bought horseradish. Ketchup. Mustard. Meat flavorings and tenderizers. Bouillon cubes. Hot sauces. Pre-made or packaged marinades. Pre-made or packaged taco seasonings. Relishes. Regular salad dressings. Other foods Salted popcorn and pretzels. The items listed above may not be a complete list  of foods and beverages you should avoid. Contact a dietitian for more information. Where to find more information National Heart, Lung, and Blood Institute: PopSteam.is American Heart Association: www.heart.org Academy of Nutrition and Dietetics: www.eatright.org National Kidney Foundation: www.kidney.org Summary The DASH eating plan is a healthy eating plan that has been shown to reduce high blood pressure (hypertension). It may also reduce your risk for type 2 diabetes, heart disease, and stroke. When on the DASH eating plan, aim to eat more fresh fruits and vegetables, whole grains, lean proteins, low-fat dairy, and heart-healthy fats. With the DASH eating plan, you should limit salt (sodium) intake to 2,300 mg a day. If you have hypertension, you may need to reduce your sodium intake to 1,500 mg a day. Work with your health care provider or dietitian to adjust your eating plan to your individual calorie needs. This information is not intended to replace advice given to you by your health care provider. Make sure you discuss any questions you have with your health care provider. Document Revised: 02/27/2019 Document Reviewed: 02/27/2019 Elsevier Patient Education  2022 [employer].

## 2021-03-15 NOTE — Progress Notes (Signed)
Subjective:    Patient ID: Isaiah Johnson, male    DOB: [birth year], 54 y.o.   MRN: [id number]  Isaiah Johnson is a 55 y.o. male presenting on 03/15/2021 for Hypertension   HPI  New diagnosis, Hypertension Recent readings 02/27/21 - 147/101 BP, at Pain Dr office Has home BP readings 156/93, HR >100 and other readings similar Never on BP medication Lifestyle: - Diet: Drinks 16-18 oz coffee most days, soda in afternoon Admits occasional sweats / heat intolerance Denies CP, dyspnea, HA, edema, dizziness / lightheadedness   Health Maintenance:  Colon CA Screening last done 09/28/17 1 polyp adenoma, repeat 5 years, [facility] Dr Alice Reichert Fam history colon CA  Depression screen [facility] 2/9 02/27/2021 11/09/2020 10/28/2019  Decreased Interest 0 0 0  Down, Depressed, Hopeless 0 0 0  PHQ - 2 Score 0 0 0  Altered sleeping - 2 -  Tired, decreased energy - 0 -  Change in appetite - 0 -  Feeling bad or failure about yourself  - 0 -  Trouble concentrating - 0 -  Moving slowly or fidgety/restless - 0 -  Suicidal thoughts - 0 -  PHQ-9 Score - 2 -  Difficult doing work/chores - Not difficult at all -    Social History   Tobacco Use   Smoking status: Never   Smokeless tobacco: Never  Substance Use Topics   Alcohol use: Yes    Comment: 1 drink   Drug use: No    Review of Systems Per HPI unless specifically indicated above     Objective:    BP (!) 144/100 (BP Location: Left Arm, Cuff Size: Normal)   Pulse 91   Ht 5' 9" (1.753 m)   Wt 242 lb 6.4 oz (110 kg)   SpO2 96%   BMI 35.80 kg/m   Wt Readings from Last 3 Encounters:  03/15/21 242 lb 6.4 oz (110 kg)  02/27/21 243 lb (110.2 kg)  11/09/20 243 lb (110.2 kg)    Physical Exam Vitals and nursing note reviewed.  Constitutional:      General: He is not in acute distress.    Appearance: Normal appearance. He is well-developed. He is obese. He is not diaphoretic.     Comments: Well-appearing, comfortable, cooperative  HENT:     Head:  Normocephalic and atraumatic.  Eyes:     General:        Right eye: No discharge.        Left eye: No discharge.     Conjunctiva/sclera: Conjunctivae normal.  Cardiovascular:     Rate and Rhythm: Normal rate.  Pulmonary:     Effort: Pulmonary effort is normal.  Skin:    General: Skin is warm and dry.     Findings: No erythema or rash.  Neurological:     Mental Status: He is alert and oriented to person, place, and time.  Psychiatric:        Mood and Affect: Mood normal.        Behavior: Behavior normal.        Thought Content: Thought content normal.     Comments: Well groomed, good eye contact, normal speech and thoughts     Results for orders placed or performed in visit on 11/09/20  COMPLETE METABOLIC PANEL WITH GFR  Result Value Ref Range   Glucose, Bld 100 (H) 65 - 99 mg/dL   BUN 15 7 - 25 mg/dL   Creat 0.99 0.70 - 1.30 mg/dL   eGFR 91 > OR =  60 mL/min/1.87m   BUN/Creatinine Ratio NOT APPLICABLE 6 - 22 (calc)   Sodium 140 135 - 146 mmol/L   Potassium 4.7 3.5 - 5.3 mmol/L   Chloride 105 98 - 110 mmol/L   CO2 27 20 - 32 mmol/L   Calcium 9.5 8.6 - 10.3 mg/dL   Total Protein 6.4 6.1 - 8.1 g/dL   Albumin 4.3 3.6 - 5.1 g/dL   Globulin 2.1 1.9 - 3.7 g/dL (calc)   AG Ratio 2.0 1.0 - 2.5 (calc)   Total Bilirubin 0.3 0.2 - 1.2 mg/dL   Alkaline phosphatase (APISO) 69 35 - 144 U/L   AST 17 10 - 35 U/L   ALT 23 9 - 46 U/L  Lipid panel  Result Value Ref Range   Cholesterol 173 <200 mg/dL   HDL 48 > OR = 40 mg/dL   Triglycerides 124 <150 mg/dL   LDL Cholesterol (Calc) 102 (H) mg/dL (calc)   Total CHOL/HDL Ratio 3.6 <5.0 (calc)   Non-HDL Cholesterol (Calc) 125 <130 mg/dL (calc)  CBC with Differential/Platelet  Result Value Ref Range   WBC 8.5 3.8 - 10.8 Thousand/uL   RBC 5.62 4.20 - 5.80 Million/uL   Hemoglobin 15.8 13.2 - 17.1 g/dL   HCT 49.6 38.5 - 50.0 %   MCV 88.3 80.0 - 100.0 fL   MCH 28.1 27.0 - 33.0 pg   MCHC 31.9 (L) 32.0 - 36.0 g/dL   RDW 12.6 11.0 - 15.0 %    Platelets 276 140 - 400 Thousand/uL   MPV 9.8 7.5 - 12.5 fL   Neutro Abs 5,406 1,500 - 7,800 cells/uL   Lymphs Abs 1,947 850 - 3,900 cells/uL   Absolute Monocytes 408 200 - 950 cells/uL   Eosinophils Absolute 680 (H) 15 - 500 cells/uL   Basophils Absolute 60 0 - 200 cells/uL   Neutrophils Relative % 63.6 %   Total Lymphocyte 22.9 %   Monocytes Relative 4.8 %   Eosinophils Relative 8.0 %   Basophils Relative 0.7 %  Hemoglobin A1c  Result Value Ref Range   Hgb A1c MFr Bld 5.6 <5.7 % of total Hgb   Mean Plasma Glucose 114 mg/dL   eAG (mmol/L) 6.3 mmol/L  PSA  Result Value Ref Range   PSA 2.03 < OR = 4.00 ng/mL      Assessment & Plan:   Problem List Items Addressed This Visit     Essential hypertension - Primary    New dx Elevated BP multiple readings outside office, here and at home. No known complications    Plan:  1. START medication Amlodipine 122mdaily - may try half tab 3m70maily for initial 2-4 week course, if BP not optimal can inc to whole tab, review benefit risk potential side effect leg swelling 2. Encourage improved lifestyle - low sodium diet, regular exercise, limit caffeine, wt loss lifestyle - goal to control BP with lifestyle and eventually come off med 3. Continue monitor BP outside office, bring readings to next visit, if persistently >140/90 or new symptoms notify office sooner      Relevant Medications   amLODipine (NORVASC) 10 MG tablet    Meds ordered this encounter  Medications   amLODipine (NORVASC) 10 MG tablet    Sig: Take 1 tablet (10 mg total) by mouth daily.    Dispense:  90 tablet    Refill:  3      Follow up plan: Return in about 4 months (around 07/14/2021) for 4 month follow-up HTN, PreDM  A1c.   [name], DO [facility] 03/15/2021, 10:15 AM

## 2021-03-27 ENCOUNTER — Other Ambulatory Visit: Payer: Self-pay | Admitting: Anesthesiology

## 2021-03-29 ENCOUNTER — Ambulatory Visit
Admission: RE | Admit: 2021-03-29 | Discharge: 2021-03-29 | Disposition: A | Discharge status: Home / Self Care | Payer: No Typology Code available for payment source | Source: Ambulatory Visit | Attending: Anesthesiology | Admitting: Anesthesiology

## 2021-03-29 ENCOUNTER — Other Ambulatory Visit: Payer: Self-pay | Admitting: Anesthesiology

## 2021-03-29 ENCOUNTER — Ambulatory Visit: Payer: No Typology Code available for payment source | Attending: Anesthesiology | Admitting: Anesthesiology

## 2021-03-29 ENCOUNTER — Other Ambulatory Visit: Payer: Self-pay

## 2021-03-29 ENCOUNTER — Encounter: Payer: Self-pay | Admitting: Anesthesiology

## 2021-03-29 VITALS — BP 152/82 | HR 105 | Temp 97.1°F | Resp 16 | Ht 69.0 in | Wt 242.0 lb

## 2021-03-29 DIAGNOSIS — M5412 Radiculopathy, cervical region: Secondary | ICD-10-CM | POA: Insufficient documentation

## 2021-03-29 DIAGNOSIS — M5030 Other cervical disc degeneration, unspecified cervical region: Secondary | ICD-10-CM | POA: Diagnosis present

## 2021-03-29 DIAGNOSIS — M542 Cervicalgia: Secondary | ICD-10-CM | POA: Diagnosis not present

## 2021-03-29 DIAGNOSIS — R52 Pain, unspecified: Secondary | ICD-10-CM

## 2021-03-29 DIAGNOSIS — M47812 Spondylosis without myelopathy or radiculopathy, cervical region: Secondary | ICD-10-CM | POA: Diagnosis present

## 2021-03-29 DIAGNOSIS — G8929 Other chronic pain: Secondary | ICD-10-CM | POA: Diagnosis present

## 2021-03-29 MED ORDER — ROPIVACAINE HCL 2 MG/ML IJ SOLN
10.0000 mL | Freq: Once | INTRAMUSCULAR | Status: AC
  Administered 2021-03-29: 11:00:00 10 mL via EPIDURAL

## 2021-03-29 MED ORDER — DEXAMETHASONE SODIUM PHOSPHATE 10 MG/ML IJ SOLN
10.0000 mg | Freq: Once | INTRAMUSCULAR | Status: AC
  Administered 2021-03-29: 11:00:00 10 mg

## 2021-03-29 MED ORDER — CYCLOBENZAPRINE HCL 10 MG PO TABS: 10.0000 mg | ORAL_TABLET | Freq: Two times a day (BID) | ORAL | 2 refills | Status: DC

## 2021-03-29 MED ORDER — ROPIVACAINE HCL 2 MG/ML IJ SOLN
INTRAMUSCULAR | Status: AC
  Filled 2021-03-29: qty 20

## 2021-03-29 MED ORDER — DEXAMETHASONE SODIUM PHOSPHATE 10 MG/ML IJ SOLN
INTRAMUSCULAR | Status: AC
  Filled 2021-03-29: qty 1

## 2021-03-29 NOTE — Progress Notes (Signed)
Nursing Pain Medication Assessment:  Safety precautions to be maintained throughout the outpatient stay will include: orient to surroundings, keep bed in low position, maintain call bell within reach at all times, provide assistance with transfer out of bed and ambulation.  Medication Inspection Compliance: Pill count conducted under aseptic conditions, in front of the patient. Neither the pills nor the bottle was removed from the patient's sight at any time. Once count was completed pills were immediately returned to the patient in their original bottle.  Medication: Tramadol (Ultram) Pill/Patch Count:  31 of 180 pills remain Pill/Patch Appearance: Markings consistent with prescribed medication Bottle Appearance: Standard pharmacy container. Clearly labeled. Filled Date: 5 / 14 / 2022 Last Medication intake:  TodaySafety precautions to be maintained throughout the outpatient stay will include: orient to surroundings, keep bed in low position, maintain call bell within reach at all times, provide assistance with transfer out of bed and ambulation.

## 2021-03-29 NOTE — Patient Instructions (Signed)

## 2021-03-30 ENCOUNTER — Telehealth: Payer: Self-pay | Admitting: *Deleted

## 2021-03-30 NOTE — Telephone Encounter (Signed)
Post procedure call:   no  questions or concerns.  

## 2021-03-31 NOTE — Progress Notes (Signed)
Subjective:  Patient ID: Isaiah Johnson, male    DOB: 1965-09-25  Age: 55 y.o. MRN: [id number]  CC: Neck Pain   Procedure: Trigger point injection to the left lateral trapezius and left splenius capitis  HPI Isaiah Johnson presents for reevaluation.  He continues to have cervicalgia like symptoms affecting the left posterior shoulder left neck radiating into the left lateral upper arm and posterior tricep area.  No change in upper extremity strength is noted.  He is having occasional numbness affecting the left hand and this is worse with certain activities especially overhead type activities.  He is taking his medications but despite these he is not achieving adequate analgesia.  His medications have been beneficial but he still having breakthrough pain.  He is taking 2 tramadol 3 times a day and continuing to have discomfort.  He is implemented aN orthotic pillow.  He did have trigger point injections recently and these were beneficial for him and he presents today requesting the same.  Outpatient Medications Prior to Visit  Medication Sig Dispense Refill   albuterol (VENTOLIN HFA) 108 (90 Base) MCG/ACT inhaler Inhale 2 puffs into the lungs every 4 (four) hours as needed for wheezing or shortness of breath (cough). 1 each 0   amLODipine (NORVASC) 10 MG tablet Take 1 tablet (10 mg total) by mouth daily. 90 tablet 3   aspirin EC 81 MG tablet Take 81 mg by mouth daily.     b complex vitamins capsule Take 1 capsule by mouth daily.     COVID-19 At Home Antigen Test [facility] COVID-19 HOME TEST) KIT use as directed 2 kit 0   fenofibrate micronized (LOFIBRA) 67 MG capsule TAKE 1 CAPSULE (67 MG TOTAL) BY MOUTH DAILY BEFORE BREAKFAST. 90 capsule 3   fluocinonide cream (LIDEX) 6.43 % Apply 1 application topically 2 (two) times daily as needed.     ipratropium (ATROVENT) 0.06 % nasal spray PLACE 2 SPRAYS INTO BOTH NOSTRILS 4 (FOUR) TIMES DAILY. FOR UP TO 5-7 DAYS THEN STOP. 15 mL 2   Melatonin 200 MCG  TABS Take by mouth.     Multiple Vitamin (MULTIVITAMIN WITH MINERALS) TABS tablet Take 1 tablet by mouth daily.     niacin 500 MG tablet Take 500 mg by mouth at bedtime.     Omega-3 Fatty Acids (FISH OIL) 1000 MG CAPS Take 1,000 mg by mouth daily.     pravastatin (PRAVACHOL) 40 MG tablet Take 1 tablet (40 mg total) by mouth daily. 90 tablet 3   Probiotic Product (PROBIOTIC DAILY PO) Take by mouth daily.     traMADol (ULTRAM) 50 MG tablet Take 2 tablets (100 mg total) by mouth 4 (four) times daily. 180 tablet 3   triamcinolone cream (KENALOG) 0.1 % Apply 1 application topically daily as needed.     acetaminophen (TYLENOL) 500 MG tablet Take 500 mg by mouth every 6 (six) hours as needed. (Patient not taking: Reported on 08/01/2020)     augmented betamethasone dipropionate (DIPROLENE-AF) 0.05 % cream  (Patient not taking: Reported on 03/07/2020)     gabapentin (NEURONTIN) 300 MG capsule Take 1 capsule (300 mg total) by mouth 2 (two) times daily. 60 capsule 2   No facility-administered medications prior to visit.    Review of Systems CNS: No confusion or sedation Cardiac: No angina or palpitations GI: No abdominal pain or constipation Constitutional: No nausea vomiting fevers or chills  Objective:  BP (!) 152/82    Pulse (!) 105    Temp (!)  97.1 F (36.2 C)    Resp 16    Ht '5\' 9"'  (1.753 m)    Wt 242 lb (109.8 kg)    SpO2 96%    BMI 35.74 kg/m    BP Readings from Last 3 Encounters:  03/29/21 (!) 152/82  03/15/21 (!) 144/100  02/27/21 (!) 147/101     Wt Readings from Last 3 Encounters:  03/29/21 242 lb (109.8 kg)  03/15/21 242 lb 6.4 oz (110 kg)  02/27/21 243 lb (110.2 kg)     Physical Exam Pt is alert and oriented PERRL EOMI HEART IS RRR no murmur or rub LCTA no wheezing or rales MUSCULOSKELETAL reveals to trigger points 1 in the left splenius capitis near the proximal right trapezius and 1 in the left lateral trapezius almost at the acromioclavicular joint.  His upper  extremity strength appears well-preserved as does his muscle tone and bulk  Labs  Lab Results  Component Value Date   HGBA1C 5.6 11/09/2020   HGBA1C 5.5 10/28/2019   HGBA1C 5.4 05/29/2017   Lab Results  Component Value Date   LDLCALC 102 (H) 11/09/2020   CREATININE 0.99 11/09/2020    -------------------------------------------------------------------------------------------------------------------- Lab Results  Component Value Date   WBC 8.5 11/09/2020   HGB 15.8 11/09/2020   HCT 49.6 11/09/2020   PLT 276 11/09/2020   GLUCOSE 100 (H) 11/09/2020   CHOL 173 11/09/2020   TRIG 124 11/09/2020   HDL 48 11/09/2020   LDLCALC 102 (H) 11/09/2020   ALT 23 11/09/2020   AST 17 11/09/2020   NA 140 11/09/2020   K 4.7 11/09/2020   CL 105 11/09/2020   CREATININE 0.99 11/09/2020   BUN 15 11/09/2020   CO2 27 11/09/2020   PSA 2.03 11/09/2020   HGBA1C 5.6 11/09/2020    --------------------------------------------------------------------------------------------------------------------- No results found.   Assessment & Plan:   Isaiah Johnson was seen today for neck pain.  Diagnoses and all orders for this visit:  Cervicalgia -     dexamethasone (DECADRON) injection 10 mg -     ropivacaine (PF) 2 mg/mL (0.2%) (NAROPIN) injection 10 mL  Chronic neck pain -     dexamethasone (DECADRON) injection 10 mg -     ropivacaine (PF) 2 mg/mL (0.2%) (NAROPIN) injection 10 mL  Cervical radicular pain -     dexamethasone (DECADRON) injection 10 mg -     ropivacaine (PF) 2 mg/mL (0.2%) (NAROPIN) injection 10 mL  DDD (degenerative disc disease), cervical -     dexamethasone (DECADRON) injection 10 mg -     ropivacaine (PF) 2 mg/mL (0.2%) (NAROPIN) injection 10 mL  Cervical facet syndrome -     dexamethasone (DECADRON) injection 10 mg -     ropivacaine (PF) 2 mg/mL (0.2%) (NAROPIN) injection 10 mL  Cervical radiculitis -     dexamethasone (DECADRON) injection 10 mg -     ropivacaine (PF) 2  mg/mL (0.2%) (NAROPIN) injection 10 mL  Other orders -     cyclobenzaprine (FLEXERIL) 10 MG tablet; Take 1 tablet (10 mg total) by mouth 2 (two) times daily.        ----------------------------------------------------------------------------------------------------------------------  Problem List Items Addressed This Visit       Unprioritized   Chronic neck pain   Relevant Medications   cyclobenzaprine (FLEXERIL) 10 MG tablet   Other Visit Diagnoses     Cervicalgia    -  Primary   Relevant Medications   dexamethasone (DECADRON) injection 10 mg (Completed)   ropivacaine (PF) 2 mg/mL (0.2%) (  NAROPIN) injection 10 mL (Completed)   Cervical radicular pain       Relevant Medications   dexamethasone (DECADRON) injection 10 mg (Completed)   ropivacaine (PF) 2 mg/mL (0.2%) (NAROPIN) injection 10 mL (Completed)   DDD (degenerative disc disease), cervical       Relevant Medications   dexamethasone (DECADRON) injection 10 mg (Completed)   ropivacaine (PF) 2 mg/mL (0.2%) (NAROPIN) injection 10 mL (Completed)   cyclobenzaprine (FLEXERIL) 10 MG tablet   Cervical facet syndrome       Relevant Medications   dexamethasone (DECADRON) injection 10 mg (Completed)   ropivacaine (PF) 2 mg/mL (0.2%) (NAROPIN) injection 10 mL (Completed)   Cervical radiculitis       Relevant Medications   dexamethasone (DECADRON) injection 10 mg (Completed)   ropivacaine (PF) 2 mg/mL (0.2%) (NAROPIN) injection 10 mL (Completed)   cyclobenzaprine (FLEXERIL) 10 MG tablet         ----------------------------------------------------------------------------------------------------------------------  1. Cervicalgia We will proceed with repeat trigger point injections today.  The risks and benefits of been reviewed all questions answered.  We gone over some exercises to assist with stretching strengthening for the upper extremities and exercises to avoid.  We will schedule him for return to clinic in  approximately 4 to 6 weeks for possible repeat injection. - dexamethasone (DECADRON) injection 10 mg - ropivacaine (PF) 2 mg/mL (0.2%) (NAROPIN) injection 10 mL  2. Chronic neck pain As above.  We will keep him on his tramadol and refill Flexeril for nighttime dosing for muscle spasm - dexamethasone (DECADRON) injection 10 mg - ropivacaine (PF) 2 mg/mL (0.2%) (NAROPIN) injection 10 mL  3. Cervical radicular pain As above - dexamethasone (DECADRON) injection 10 mg - ropivacaine (PF) 2 mg/mL (0.2%) (NAROPIN) injection 10 mL  4. DDD (degenerative disc disease), cervical  - dexamethasone (DECADRON) injection 10 mg - ropivacaine (PF) 2 mg/mL (0.2%) (NAROPIN) injection 10 mL  5. Cervical facet syndrome  - dexamethasone (DECADRON) injection 10 mg - ropivacaine (PF) 2 mg/mL (0.2%) (NAROPIN) injection 10 mL  6. Cervical radiculitis  - dexamethasone (DECADRON) injection 10 mg - ropivacaine (PF) 2 mg/mL (0.2%) (NAROPIN) injection 10 mL    ----------------------------------------------------------------------------------------------------------------------  I have discontinued Isaiah Johnson "Dan"'s cyclobenzaprine and cyclobenzaprine. I am also having him start on cyclobenzaprine. Additionally, I am having him maintain his multivitamin with minerals, aspirin EC, niacin, Fish Oil, fluocinonide cream, triamcinolone cream, b complex vitamins, Probiotic Product (PROBIOTIC DAILY PO), acetaminophen, Melatonin, augmented betamethasone dipropionate, albuterol, [facility] COVID-19 Home Test, pravastatin, fenofibrate micronized, ipratropium, gabapentin, and amLODipine. We administered dexamethasone and ropivacaine (PF) 2 mg/mL (0.2%).   Meds ordered this encounter  Medications   dexamethasone (DECADRON) injection 10 mg   ropivacaine (PF) 2 mg/mL (0.2%) (NAROPIN) injection 10 mL   cyclobenzaprine (FLEXERIL) 10 MG tablet    Sig: Take 1 tablet (10 mg total) by mouth 2 (two) times daily.    Dispense:   60 tablet    Refill:  2   Patient's Medications  New Prescriptions   CYCLOBENZAPRINE (FLEXERIL) 10 MG TABLET    Take 1 tablet (10 mg total) by mouth 2 (two) times daily.  Previous Medications   ACETAMINOPHEN (TYLENOL) 500 MG TABLET    Take 500 mg by mouth every 6 (six) hours as needed.   ALBUTEROL (VENTOLIN HFA) 108 (90 BASE) MCG/ACT INHALER    Inhale 2 puffs into the lungs every 4 (four) hours as needed for wheezing or shortness of breath (cough).   AMLODIPINE (NORVASC) 10 MG TABLET  Take 1 tablet (10 mg total) by mouth daily.   ASPIRIN EC 81 MG TABLET    Take 81 mg by mouth daily.   AUGMENTED BETAMETHASONE DIPROPIONATE (DIPROLENE-AF) 0.05 % CREAM       B COMPLEX VITAMINS CAPSULE    Take 1 capsule by mouth daily.   COVID-19 AT HOME ANTIGEN TEST ([facility] COVID-19 HOME TEST) KIT    use as directed   FENOFIBRATE MICRONIZED (LOFIBRA) 67 MG CAPSULE    TAKE 1 CAPSULE (67 MG TOTAL) BY MOUTH DAILY BEFORE BREAKFAST.   FLUOCINONIDE CREAM (LIDEX) 0.05 %    Apply 1 application topically 2 (two) times daily as needed.   GABAPENTIN (NEURONTIN) 300 MG CAPSULE    Take 1 capsule (300 mg total) by mouth 2 (two) times daily.   IPRATROPIUM (ATROVENT) 0.06 % NASAL SPRAY    PLACE 2 SPRAYS INTO BOTH NOSTRILS 4 (FOUR) TIMES DAILY. FOR UP TO 5-7 DAYS THEN STOP.   MELATONIN 200 MCG TABS    Take by mouth.   MULTIPLE VITAMIN (MULTIVITAMIN WITH MINERALS) TABS TABLET    Take 1 tablet by mouth daily.   NIACIN 500 MG TABLET    Take 500 mg by mouth at bedtime.   OMEGA-3 FATTY ACIDS (FISH OIL) 1000 MG CAPS    Take 1,000 mg by mouth daily.   PRAVASTATIN (PRAVACHOL) 40 MG TABLET    Take 1 tablet (40 mg total) by mouth daily.   PROBIOTIC PRODUCT (PROBIOTIC DAILY PO)    Take by mouth daily.   TRIAMCINOLONE CREAM (KENALOG) 0.1 %    Apply 1 application topically daily as needed.  Modified Medications   No medications on file  Discontinued Medications   CYCLOBENZAPRINE (FLEXERIL) 10 MG TABLET    Take 1 tablet (10 mg total)  by mouth 2 (two) times daily.   CYCLOBENZAPRINE (FLEXERIL) 10 MG TABLET    Take 1 tablet (10 mg total) by mouth 2 (two) times daily.   ----------------------------------------------------------------------------------------------------------------------  Follow-up: Return in about 2 months (around 05/30/2021) for evaluation, procedure.  Trigger point injection: The area overlying the aforementioned trigger points were prepped with alcohol. They were then injected with a 25-gauge needle with 4 cc of ropivacaine 0.2% and Decadron 4 mg at each site after negative aspiration for heme. This was performed after informed consent was obtained and risks and benefits reviewed. She tolerated this procedure without difficulty and was convalesced and discharged to home in stable condition for follow-up as mentioned.  '@Aileen Amore'  [place], MD@   Molli Barrows, MD

## 2021-04-14 ENCOUNTER — Other Ambulatory Visit: Payer: Self-pay | Admitting: Anesthesiology

## 2021-04-17 ENCOUNTER — Telehealth: Payer: Self-pay | Admitting: Anesthesiology

## 2021-04-17 MED ORDER — GABAPENTIN 300 MG PO CAPS: 300.0000 mg | ORAL_CAPSULE | Freq: Two times a day (BID) | ORAL | 3 refills | Status: DC

## 2021-04-17 NOTE — Telephone Encounter (Signed)
Patient called asking about his medication refill? He was seen in December for a procedure. Does Dr. Pernell Dupre want him to continue taking them? Please advise patient

## 2021-04-17 NOTE — Telephone Encounter (Signed)
Needs Gabapentin. Unsure if it was discontinued. According to Dr. Pernell Dupre' notes, Cyclobenzaprine was d/c'd, but it was prescribed. Gabapentin has not been prescribed since 12/2020. Will ask Dr. Pernell Dupre when he comes to the office.

## 2021-04-17 NOTE — Addendum Note (Signed)
Addended by: Yevette Edwards on: 04/17/2021 03:34 PM   Modules accepted: Orders

## 2021-04-17 NOTE — Telephone Encounter (Signed)
Patient notified that script for Gabapentin has been sent to pharmacy.

## 2021-06-17 ENCOUNTER — Other Ambulatory Visit: Payer: Self-pay | Admitting: Anesthesiology

## 2021-06-17 DIAGNOSIS — G8929 Other chronic pain: Secondary | ICD-10-CM

## 2021-07-05 ENCOUNTER — Other Ambulatory Visit: Payer: Self-pay | Admitting: Anesthesiology

## 2021-07-05 DIAGNOSIS — G8929 Other chronic pain: Secondary | ICD-10-CM

## 2021-07-06 ENCOUNTER — Other Ambulatory Visit: Payer: Self-pay | Admitting: Anesthesiology

## 2021-07-16 ENCOUNTER — Other Ambulatory Visit: Payer: Self-pay | Admitting: Family Medicine

## 2021-07-16 DIAGNOSIS — J9801 Acute bronchospasm: Secondary | ICD-10-CM

## 2021-07-16 DIAGNOSIS — J0100 Acute maxillary sinusitis, unspecified: Secondary | ICD-10-CM

## 2021-07-16 DIAGNOSIS — J029 Acute pharyngitis, unspecified: Secondary | ICD-10-CM

## 2021-07-17 NOTE — Telephone Encounter (Signed)
Requested medication (s) are due for refill today: yes ? ?Requested medication (s) are on the active medication list: yes ? ?Last refill:  08/01/20 ? ?Future visit scheduled: no ? ?Notes to clinic:  Unable to refill per protocol, cannot delegate no protocol assigned. ? ? ?  ?Requested Prescriptions  ?Pending Prescriptions Disp Refills  ? ipratropium (ATROVENT) 0.06 % nasal spray [Pharmacy Med Name: IPRATROPIUM 0.06% SPRAY]  2  ?  Sig: PLACE 2 SPRAYS INTO BOTH NOSTRILS 4 (FOUR) TIMES DAILY. FOR UP TO 5-7 DAYS THEN STOP.  ?  ? Off-Protocol Failed - 07/16/2021  8:31 AM  ?  ?  Failed - Medication not assigned to a protocol, review manually.  ?  ?  Passed - Valid encounter within last 12 months  ?  Recent Outpatient Visits   ? ?      ? 4 months ago Essential hypertension  ? [place], DO  ? 8 months ago Annual physical exam  ? [facility] Olin Hauser, DO  ? 11 months ago Appointment canceled by hospital  ? [place], DO  ? 11 months ago Acute non-recurrent maxillary sinusitis  ? [place], DO  ? 1 year ago Annual physical exam  ? [place], DO  ? ?  ?  ? ?  ?  ? Off-Protocol Failed - 07/16/2021  8:31 AM  ?  ?  Failed - Medication not assigned to a protocol, review manually.  ?  ?  Passed - Valid encounter within last 12 months  ?  Recent Outpatient Visits   ? ?      ? 4 months ago Essential hypertension  ? [place], DO  ? 8 months ago Annual physical exam  ? [facility] Olin Hauser, DO  ? 11 months ago Appointment canceled by hospital  ? [place], DO  ? 11 months ago Acute non-recurrent maxillary sinusitis  ? [place], DO  ? 1 year ago Annual physical exam  ? [place], DO  ? ?  ?  ? ?  ?  ?  ? ? ?

## 2021-08-04 ENCOUNTER — Ambulatory Visit: Payer: Self-pay

## 2021-08-04 NOTE — Telephone Encounter (Signed)
Summary: Swelling in legs and feet  ? Pt called and wants to schedule an appt, he is having a reaction to his medication. He has been experiencing swelling in his legs and feet for about 5-6 weeks.  ? ?[phone]   ?  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 ?

## 2021-08-04 NOTE — Telephone Encounter (Signed)
Summary: Swelling in legs and feet  ? Pt called and wants to schedule an appt, he is having a reaction to his medication. He has been experiencing swelling in his legs and feet for about 5-6 weeks.  ? ?[phone]   ?  ?Called pt - lmom to return call. ?

## 2021-08-04 NOTE — Telephone Encounter (Signed)
Reason for Disposition ?? [1] MODERATE leg swelling (e.g., swelling extends up to knees) AND [2] new-onset or worsening ? ?Answer Assessment - Initial Assessment Questions ?1. ONSET: "When did the swelling start?" (e.g., minutes, hours, days) ?    Late Feb ?2. LOCATION: "What part of the leg is swollen?"  "Are both legs swollen or just one leg?" ?    Below knee down to feet ?3. SEVERITY: "How bad is the swelling?" (e.g., localized; mild, moderate, severe) ? - Localized - small area of swelling localized to one leg ? - MILD pedal edema - swelling limited to foot and ankle, pitting edema < 1/4 inch (6 mm) deep, rest and elevation eliminate most or all swelling ? - MODERATE edema - swelling of lower leg to knee, pitting edema > 1/4 inch (6 mm) deep, rest and elevation only partially reduce swelling ? - SEVERE edema - swelling extends above knee, facial or hand swelling present  ?    moderate ?4. REDNESS: "Does the swelling look red or infected?" ?    Brown dots lateral side left foot    middle of right leg to ankle itching ?5. PAIN: "Is the swelling painful to touch?" If Yes, ask: "How painful is it?"   (Scale 1-10; mild, moderate or severe) ?    no ?6. FEVER: "Do you have a fever?" If Yes, ask: "What is it, how was it measured, and when did it start?"  ?    no ?7. CAUSE: "What do you think is causing the leg swelling?" ?    Related yo medical  ?8. MEDICAL HISTORY: "Do you have a history of heart failure, kidney disease, liver failure, or cancer?" ?    no ?9. RECURRENT SYMPTOM: "Have you had leg swelling before?" If Yes, ask: "When was the last time?" "What happened that time?" ?    no ?10. OTHER SYMPTOMS: "Do you have any other symptoms?" (e.g., chest pain, difficulty breathing) ?      Rash ?11. PREGNANCY: "Is there any chance you are pregnant?" "When was your last menstrual period?" ?      N/a ? ?Protocols used: Leg Swelling and Edema-A-AH ? ?

## 2021-08-07 ENCOUNTER — Other Ambulatory Visit: Payer: Self-pay | Admitting: Anesthesiology

## 2021-08-07 NOTE — Telephone Encounter (Signed)
Would keep apt for 5/3 as that is in less than 2 days, already afternoon on 5/1 now. ? ?Isaiah Putnam, DO ?[facility] ?[facility] ?08/07/2021, 1:06 PM ? ?

## 2021-08-09 ENCOUNTER — Ambulatory Visit: Payer: No Typology Code available for payment source | Admitting: Family Medicine

## 2021-08-09 ENCOUNTER — Telehealth: Payer: Self-pay | Admitting: Anesthesiology

## 2021-08-09 ENCOUNTER — Ambulatory Visit (INDEPENDENT_AMBULATORY_CARE_PROVIDER_SITE_OTHER): Payer: No Typology Code available for payment source | Admitting: Family Medicine

## 2021-08-09 ENCOUNTER — Encounter: Payer: Self-pay | Admitting: Family Medicine

## 2021-08-09 VITALS — BP 132/88 | HR 90 | Ht 69.0 in | Wt 249.2 lb

## 2021-08-09 DIAGNOSIS — I10 Essential (primary) hypertension: Secondary | ICD-10-CM

## 2021-08-09 MED ORDER — VALSARTAN 160 MG PO TABS: 160.0000 mg | ORAL_TABLET | Freq: Every day | ORAL | 3 refills | Status: DC

## 2021-08-09 NOTE — Telephone Encounter (Signed)
Returned patient phone call, he states that he needs a PA for Tramadol.  Reviewed chart, last Rx 02/28/21.  Called pharmacy and they can fill his Rx, No PA needed.  Called patient back to let him know that he does need an appt with JA so that his medication does not run out.  Patient verbalizes u/o information.  States he will call back to schedule an appt.  ?

## 2021-08-09 NOTE — Patient Instructions (Addendum)
Thank you for coming to the office today. ? ?Stop Amlodipine 10mg . ? ?Switch to Valsartan 160mg  daily. New BP pill. It works through the Kidneys. Should NOT cause swelling. Probably after 1-2 weeks swelling should certainly subside. ? ?RICE therapy ? ?Use RICE therapy: ?- R - Rest / relative rest with activity modification avoid overuse of joint ?- I - Ice packs (make sure you use a towel or sock / something to protect skin) ?- C - Compression with stockings / ACE wrap to apply pressure and reduce swelling allowing more support ?- E - Elevation - if significant swelling, lift leg above heart level (toes above your nose) to help reduce swelling, most helpful at night after day of being on your feet ? ?Labs in 3 months to double check kidneys / potassium. ? ?Stop the Pravastatin 40mg  daily for now, see if symptoms side effects improve. ?If they do improve, we can lower the dose to HALF or try intermittent. ?Future we can switch the med, or trial off for a period of time and see if you need it. ? ? ?DUE for FASTING BLOOD WORK (no food or drink after midnight before the lab appointment, only water or coffee without cream/sugar on the morning of) ? ?SCHEDULE "Lab Only" visit in the morning at the clinic for lab draw in 3 MONTHS  ? ?- Make sure Lab Only appointment is at about 1 week before your next appointment, so that results will be available ? ?For Lab Results, once available within 2-3 days of blood draw, you can can log in to MyChart online to view your results and a brief explanation. Also, we can discuss results at next follow-up visit. ? ? ?Please schedule a Follow-up Appointment to: Return in about 3 months (around 11/09/2021) for 3 month fasting lab only then 1 week later Annual Physical. ? ?If you have any other questions or concerns, please feel free to call the office or send a message through MyChart. You may also schedule an earlier appointment if necessary. ? ?Additionally, you may be receiving a survey  about your experience at our office within a few days to 1 week by e-mail or mail. We value your feedback. ? ? , DO ?[facility], 01/09/2022 ?

## 2021-08-09 NOTE — Progress Notes (Signed)
? ?Subjective:  ? ? Patient ID: Isaiah Johnson, male    DOB: 1965/07/24, 56 y.o.   MRN: [id number] ? ?Isaiah Johnson is a 56 y.o. male presenting on 08/09/2021 for Hypertension and Foot Swelling ? ? ?HPI ? ?Hypertension ?Last visit 03/15/22 new dx HTN and started on Amlodipine 25m daily. He did develop side effect with swelling in bilateral lower extremities ankles and feet. No other areas of swelling. ? ?No other BP medication tried before. ? ?Interested to switch meds today. ? ?Home Readings improved overall ? ?Med: Amlodipine 152mdaily ?Lifestyle: ?Drinks coffee, soda ?Admits occasional sweats / heat intolerance ?Denies CP, dyspnea, HA, edema, dizziness / lightheadedness ? ?Hyperlipidemia ?On Pravastatin 4033maily. Now has reported some myalgia side effect. ? ? ? ?  02/27/2021  ?  1:03 PM 11/09/2020  ?  8:24 AM 10/28/2019  ?  9:33 AM  ?Depression screen PHQ 2/9  ?Decreased Interest 0 0 0  ?Down, Depressed, Hopeless 0 0 0  ?PHQ - 2 Score 0 0 0  ?Altered sleeping  2   ?Tired, decreased energy  0   ?Change in appetite  0   ?Feeling bad or failure about yourself   0   ?Trouble concentrating  0   ?Moving slowly or fidgety/restless  0   ?Suicidal thoughts  0   ?PHQ-9 Score  2   ?Difficult doing work/chores  Not difficult at all   ? ? ?Social History  ? ?Tobacco Use  ? Smoking status: Never  ? Smokeless tobacco: Never  ?Substance Use Topics  ? Alcohol use: Yes  ?  Comment: 1 drink  ? Drug use: No  ? ? ?Review of Systems ?Per HPI unless specifically indicated above ? ?   ?Objective:  ?  ?BP 132/88   Pulse 90   Ht '5\' 9"'  (1.753 m)   Wt 249 lb 3.2 oz (113 kg)   SpO2 98%   BMI 36.80 kg/m?   ?Wt Readings from Last 3 Encounters:  ?08/09/21 249 lb 3.2 oz (113 kg)  ?03/29/21 242 lb (109.8 kg)  ?03/15/21 242 lb 6.4 oz (110 kg)  ?  ?Physical Exam ?Vitals and nursing note reviewed.  ?Constitutional:   ?   General: He is not in acute distress. ?   Appearance: Normal appearance. He is well-developed. He is not diaphoretic.  ?    Comments: Well-appearing, comfortable, cooperative  ?HENT:  ?   Head: Normocephalic and atraumatic.  ?Eyes:  ?   General:     ?   Right eye: No discharge.     ?   Left eye: No discharge.  ?   Conjunctiva/sclera: Conjunctivae normal.  ?Cardiovascular:  ?   Rate and Rhythm: Normal rate.  ?Pulmonary:  ?   Effort: Pulmonary effort is normal.  ?Musculoskeletal:  ?   Right lower leg: Edema present.  ?   Left lower leg: Edema present.  ?Skin: ?   General: Skin is warm and dry.  ?   Findings: No erythema or rash.  ?Neurological:  ?   Mental Status: He is alert and oriented to person, place, and time.  ?Psychiatric:     ?   Mood and Affect: Mood normal.     ?   Behavior: Behavior normal.     ?   Thought Content: Thought content normal.  ?   Comments: Well groomed, good eye contact, normal speech and thoughts  ? ? ? ?Results for orders placed or performed in visit on 11/09/20  ?  COMPLETE METABOLIC PANEL WITH GFR  ?Result Value Ref Range  ? Glucose, Bld 100 (H) 65 - 99 mg/dL  ? BUN 15 7 - 25 mg/dL  ? Creat 0.99 0.70 - 1.30 mg/dL  ? eGFR 91 > OR = 60 mL/min/1.38m  ? BUN/Creatinine Ratio NOT APPLICABLE 6 - 22 (calc)  ? Sodium 140 135 - 146 mmol/L  ? Potassium 4.7 3.5 - 5.3 mmol/L  ? Chloride 105 98 - 110 mmol/L  ? CO2 27 20 - 32 mmol/L  ? Calcium 9.5 8.6 - 10.3 mg/dL  ? Total Protein 6.4 6.1 - 8.1 g/dL  ? Albumin 4.3 3.6 - 5.1 g/dL  ? Globulin 2.1 1.9 - 3.7 g/dL (calc)  ? AG Ratio 2.0 1.0 - 2.5 (calc)  ? Total Bilirubin 0.3 0.2 - 1.2 mg/dL  ? Alkaline phosphatase (APISO) 69 35 - 144 U/L  ? AST 17 10 - 35 U/L  ? ALT 23 9 - 46 U/L  ?Lipid panel  ?Result Value Ref Range  ? Cholesterol 173 <200 mg/dL  ? HDL 48 > OR = 40 mg/dL  ? Triglycerides 124 <150 mg/dL  ? LDL Cholesterol (Calc) 102 (H) mg/dL (calc)  ? Total CHOL/HDL Ratio 3.6 <5.0 (calc)  ? Non-HDL Cholesterol (Calc) 125 <130 mg/dL (calc)  ?CBC with Differential/Platelet  ?Result Value Ref Range  ? WBC 8.5 3.8 - 10.8 Thousand/uL  ? RBC 5.62 4.20 - 5.80 Million/uL  ? Hemoglobin  15.8 13.2 - 17.1 g/dL  ? HCT 49.6 38.5 - 50.0 %  ? MCV 88.3 80.0 - 100.0 fL  ? MCH 28.1 27.0 - 33.0 pg  ? MCHC 31.9 (L) 32.0 - 36.0 g/dL  ? RDW 12.6 11.0 - 15.0 %  ? Platelets 276 140 - 400 Thousand/uL  ? MPV 9.8 7.5 - 12.5 fL  ? Neutro Abs 5,406 1,500 - 7,800 cells/uL  ? Lymphs Abs 1,947 850 - 3,900 cells/uL  ? Absolute Monocytes 408 200 - 950 cells/uL  ? Eosinophils Absolute 680 (H) 15 - 500 cells/uL  ? Basophils Absolute 60 0 - 200 cells/uL  ? Neutrophils Relative % 63.6 %  ? Total Lymphocyte 22.9 %  ? Monocytes Relative 4.8 %  ? Eosinophils Relative 8.0 %  ? Basophils Relative 0.7 %  ?Hemoglobin A1c  ?Result Value Ref Range  ? Hgb A1c MFr Bld 5.6 <5.7 % of total Hgb  ? Mean Plasma Glucose 114 mg/dL  ? eAG (mmol/L) 6.3 mmol/L  ?PSA  ?Result Value Ref Range  ? PSA 2.03 < OR = 4.00 ng/mL  ? ?   ?Assessment & Plan:  ? ?Problem List Items Addressed This Visit   ? ? Essential hypertension - Primary  ? Relevant Medications  ? valsartan (DIOVAN) 160 MG tablet  ? ?Stop Amlodipine 131m ? ?Switch to Valsartan 16063maily. New BP pill. It works through the Kidneys. Should NOT cause swelling. Probably after 1-2 weeks swelling should certainly subside. ? ?RICE therapy ? ?Use RICE therapy: ?- R - Rest / relative rest with activity modification avoid overuse of joint ?- I - Ice packs (make sure you use a towel or sock / something to protect skin) ?- C - Compression with stockings / ACE wrap to apply pressure and reduce swelling allowing more support ?- E - Elevation - if significant swelling, lift leg above heart level (toes above your nose) to help reduce swelling, most helpful at night after day of being on your feet ? ?Labs in 3 months to double check  kidneys / potassium. ? ?Stop the Pravastatin 7m daily for now, see if symptoms side effects improve. ?If they do improve, we can lower the dose to HALF or try intermittent. ?Future we can switch the med, or trial off for a period of time and see if you need it. ?  ?Meds  ordered this encounter  ?Medications  ? valsartan (DIOVAN) 160 MG tablet  ?  Sig: Take 1 tablet (160 mg total) by mouth daily.  ?  Dispense:  90 tablet  ?  Refill:  3  ?  Discontinue Amlodipine, switch to Valsartan  ? ? ?Follow up plan: ?Return in about 3 months (around 11/09/2021) for 3 month fasting lab only then 1 week later Annual Physical. ? ?Labs ordered ? ?ANobie Putnam DO ?[facility] ?08/09/2021, 3:28 PM ?

## 2021-08-10 ENCOUNTER — Other Ambulatory Visit: Payer: Self-pay | Admitting: Family Medicine

## 2021-08-10 DIAGNOSIS — E782 Mixed hyperlipidemia: Secondary | ICD-10-CM

## 2021-08-10 DIAGNOSIS — E669 Obesity, unspecified: Secondary | ICD-10-CM

## 2021-08-10 DIAGNOSIS — Z0000 Encounter for general adult medical examination without abnormal findings: Secondary | ICD-10-CM

## 2021-08-10 DIAGNOSIS — R7309 Other abnormal glucose: Secondary | ICD-10-CM

## 2021-08-10 DIAGNOSIS — I10 Essential (primary) hypertension: Secondary | ICD-10-CM

## 2021-08-10 DIAGNOSIS — Z125 Encounter for screening for malignant neoplasm of prostate: Secondary | ICD-10-CM

## 2021-08-15 ENCOUNTER — Ambulatory Visit: Payer: No Typology Code available for payment source | Attending: Anesthesiology | Admitting: Anesthesiology

## 2021-08-15 ENCOUNTER — Other Ambulatory Visit: Payer: Self-pay | Admitting: Anesthesiology

## 2021-08-15 ENCOUNTER — Encounter: Payer: Self-pay | Admitting: Anesthesiology

## 2021-08-15 ENCOUNTER — Other Ambulatory Visit: Payer: Self-pay

## 2021-08-15 ENCOUNTER — Ambulatory Visit: Admission: RE | Admit: 2021-08-15 | Payer: No Typology Code available for payment source | Source: Ambulatory Visit

## 2021-08-15 VITALS — BP 105/94 | HR 92 | Temp 97.6°F | Resp 16 | Ht 69.0 in | Wt 245.0 lb

## 2021-08-15 DIAGNOSIS — G8929 Other chronic pain: Secondary | ICD-10-CM

## 2021-08-15 DIAGNOSIS — M47812 Spondylosis without myelopathy or radiculopathy, cervical region: Secondary | ICD-10-CM

## 2021-08-15 DIAGNOSIS — M542 Cervicalgia: Secondary | ICD-10-CM | POA: Diagnosis present

## 2021-08-15 DIAGNOSIS — R29898 Other symptoms and signs involving the musculoskeletal system: Secondary | ICD-10-CM | POA: Diagnosis present

## 2021-08-15 DIAGNOSIS — M5030 Other cervical disc degeneration, unspecified cervical region: Secondary | ICD-10-CM

## 2021-08-15 DIAGNOSIS — M79601 Pain in right arm: Secondary | ICD-10-CM

## 2021-08-15 DIAGNOSIS — M5412 Radiculopathy, cervical region: Secondary | ICD-10-CM

## 2021-08-15 DIAGNOSIS — R52 Pain, unspecified: Secondary | ICD-10-CM

## 2021-08-15 MED ORDER — CYCLOBENZAPRINE HCL 10 MG PO TABS
10.0000 mg | ORAL_TABLET | Freq: Two times a day (BID) | ORAL | 2 refills | Status: DC
Start: 2021-08-15 — End: 2022-03-29

## 2021-08-15 MED ORDER — DEXAMETHASONE SODIUM PHOSPHATE 10 MG/ML IJ SOLN
INTRAMUSCULAR | Status: AC
  Filled 2021-08-15: qty 1

## 2021-08-15 MED ORDER — DEXAMETHASONE SODIUM PHOSPHATE 10 MG/ML IJ SOLN
10.0000 mg | Freq: Once | INTRAMUSCULAR | Status: AC
  Administered 2021-08-15: 10 mg

## 2021-08-15 MED ORDER — ROPIVACAINE HCL 2 MG/ML IJ SOLN
10.0000 mL | Freq: Once | INTRAMUSCULAR | Status: AC
  Administered 2021-08-15: 10 mL via EPIDURAL

## 2021-08-15 MED ORDER — ROPIVACAINE HCL 2 MG/ML IJ SOLN
INTRAMUSCULAR | Status: AC
  Filled 2021-08-15: qty 20

## 2021-08-15 MED ORDER — TRAMADOL HCL 50 MG PO TABS: 100.0000 mg | ORAL_TABLET | Freq: Three times a day (TID) | ORAL | 2 refills | Status: AC

## 2021-08-15 NOTE — Progress Notes (Signed)
Safety precautions to be maintained throughout the outpatient stay will include: orient to surroundings, keep bed in low position, maintain call bell within reach at all times, provide assistance with transfer out of bed and ambulation.  

## 2021-08-15 NOTE — Patient Instructions (Signed)
Pain Management Discharge Instructions ? ?General Discharge Instructions : ? ?If you need to reach your doctor call: Monday-Friday 8:00 am - 4:00 pm at [phone] or toll free [phone].  After clinic hours [phone] to have operator reach doctor. ? ?Bring all of your medication bottles to all your appointments in the pain clinic. ? ?To cancel or reschedule your appointment with Pain Management please remember to call 24 hours in advance to avoid a fee. ? ?Refer to the educational materials which you have been given on: General Risks, I had my Procedure. Discharge Instructions, Post Sedation. ? ?Post Procedure Instructions: ? ?The drugs you were given will stay in your system until tomorrow, so for the next 24 hours you should not drive, make any legal decisions or drink any alcoholic beverages. ? ?You may eat anything you prefer, but it is better to start with liquids then soups and crackers, and gradually work up to solid foods. ? ?Please notify your doctor immediately if you have any unusual bleeding, trouble breathing or pain that is not related to your normal pain. ? ?Depending on the type of procedure that was done, some parts of your body may feel week and/or numb.  This usually clears up by tonight or the next day. ? ?Walk with the use of an assistive device or accompanied by an adult for the 24 hours. ? ?You may use ice on the affected area for the first 24 hours.  Put ice in a Ziploc bag and cover with a towel and place against area 15 minutes on 15 minutes off.  You may switch to heat after 24 hours.GENERAL RISKS AND COMPLICATIONS ? ?What are the risk, side effects and possible complications? ?Generally speaking, most procedures are safe.  However, with any procedure there are risks, side effects, and the possibility of complications.  The risks and complications are dependent upon the sites that are lesioned, or the type of nerve block to be performed.  The closer the procedure is to the spine,  the more serious the risks are.  Great care is taken when placing the radio frequency needles, block needles or lesioning probes, but sometimes complications can occur. ?Infection: Any time there is an injection through the skin, there is a risk of infection.  This is why sterile conditions are used for these blocks.  There are four possible types of infection. ?Localized skin infection. ?Central Nervous System Infection-This can be in the form of Meningitis, which can be deadly. ?Epidural Infections-This can be in the form of an epidural abscess, which can cause pressure inside of the spine, causing compression of the spinal cord with subsequent paralysis. This would require an emergency surgery to decompress, and there are no guarantees that the patient would recover from the paralysis. ?Discitis-This is an infection of the intervertebral discs.  It occurs in about 1% of discography procedures.  It is difficult to treat and it may lead to surgery. ? ?      2. Pain: the needles have to go through skin and soft tissues, will cause soreness. ?      3. Damage to internal structures:  The nerves to be lesioned may be near blood vessels or   ? other nerves which can be potentially damaged. ?      4. Bleeding: Bleeding is more common if the patient is taking blood thinners such as  aspirin, Coumadin, Ticiid, Plavix, etc., or if he/she have some genetic predisposition  such as hemophilia. Bleeding into the spinal   canal can cause compression of the spinal  cord with subsequent paralysis.  This would require an emergency surgery to  decompress and there are no guarantees that the patient would recover from the  paralysis. ?      5. Pneumothorax:  Puncturing of a lung is a possibility, every time a needle is introduced in  the area of the chest or upper back.  Pneumothorax refers to free air around the  collapsed lung(s), inside of the thoracic cavity (chest cavity).  Another two possible  complications related to a similar  event would include: Hemothorax and Chylothorax.   These are variations of the Pneumothorax, where instead of air around the collapsed  lung(s), you may have blood or chyle, respectively. ?      6. Spinal headaches: They may occur with any procedures in the area of the spine. ?      7. Persistent CSF (Cerebro-Spinal Fluid) leakage: This is a rare problem, but may occur  with prolonged intrathecal or epidural catheters either due to the formation of a fistulous  track or a dural tear. ?      8. Nerve damage: By working so close to the spinal cord, there is always a possibility of  nerve damage, which could be as serious as a permanent spinal cord injury with  paralysis. ?      9. Death:  Although rare, severe deadly allergic reactions known as "Anaphylactic  reaction" can occur to any of the medications used. ?     10. Worsening of the symptoms:  We can always make thing worse. ? ?What are the chances of something like this happening? ?Chances of any of this occuring are extremely low.  By statistics, you have more of a chance of getting killed in a motor vehicle accident: while driving to the hospital than any of the above occurring .  Nevertheless, you should be aware that they are possibilities.  In general, it is similar to taking a shower.  Everybody knows that you can slip, hit your head and get killed.  Does that mean that you should not shower again?  Nevertheless always keep in mind that statistics do not mean anything if you happen to be on the wrong side of them.  Even if a procedure has a 1 (one) in a 1,000,000 (million) chance of going wrong, it you happen to be that one..Also, keep in mind that by statistics, you have more of a chance of having something go wrong when taking medications. ? ?Who should not have this procedure? ?If you are on a blood thinning medication (e.g. Coumadin, Plavix, see list of "Blood Thinners"), or if you have an active infection going on, you should not have the procedure.   If you are taking any blood thinners, please inform your physician. ? ?How should I prepare for this procedure? ?Do not eat or drink anything at least six hours prior to the procedure. ?Bring a driver with you .  It cannot be a taxi. ?Come accompanied by an adult that can drive you back, and that is strong enough to help you if your legs get weak or numb from the local anesthetic. ?Take all of your medicines the morning of the procedure with just enough water to swallow them. ?If you have diabetes, make sure that you are scheduled to have your procedure done first thing in the morning, whenever possible. ?If you have diabetes, take only half of your insulin dose and notify our nurse  that you have done so as soon as you arrive at the clinic. ?If you are diabetic, but only take blood sugar pills (oral hypoglycemic), then do not take them on the morning of your procedure.  You may take them after you have had the procedure. ?Do not take aspirin or any aspirin-containing medications, at least eleven (11) days prior to the procedure.  They may prolong bleeding. ?Wear loose fitting clothing that may be easy to take off and that you would not mind if it got stained with Betadine or blood. ?Do not wear any jewelry or perfume ?Remove any nail coloring.  It will interfere with some of our monitoring equipment. ? ?NOTE: Remember that this is not meant to be interpreted as a complete list of all possible complications.  Unforeseen problems may occur. ? ?BLOOD THINNERS ?The following drugs contain aspirin or other products, which can cause increased bleeding during surgery and should not be taken for 2 weeks prior to and 1 week after surgery.  If you should need take something for relief of minor pain, you may take acetaminophen which is found in Tylenol,m Datril, Anacin-3 and Panadol. It is not blood thinner. The products listed below are.  Do not take any of the products listed below in addition to any listed on your  instruction sheet. ? ?A.P.C or A.P.C with Codeine Codeine Phosphate Capsules #3 Ibuprofen Ridaura  ?ABC compound Congesprin Imuran rimadil  ?Advil Cope Indocin Robaxisal  ?Alka-Seltzer Effervescent Pain Reliev

## 2021-08-16 ENCOUNTER — Telehealth: Payer: Self-pay | Admitting: *Deleted

## 2021-08-16 NOTE — Progress Notes (Signed)
? ?Subjective:  ?Patient ID: Isaiah Johnson, male    DOB: 1965-08-27  Age: 57 y.o. MRN: [id number] ? ?CC: Neck Pain (left) and Shoulder Pain (right, radiating to right arm) ? ? ?Procedure: Trigger point injections to the mid body of the trapezius left side x3 ? ?HPI ?Isaiah Johnson presents for reevaluation.  Isaiah Johnson is complaining of recurrent neck and shoulder pain similar to what he has experienced in the past.  Previously he has had trigger point injections for this and they have worked well for him.  He has recurrence of some left neck and left posterior trapezius and lateral deltoid pain.  Presently, this does not radiate into the left upper arm.  He occasionally gets some right sided neck and shoulder pain radiating into the right lateral shoulder and upper lateral arm.  No effect on upper extremity grip or bicep tricep strength is noted.  The pain is described as a spasming type pain worse with activity and occasionally protracted in nature.  He presently takes tramadol 2 tablets approximately 3 times a day and this works well keeping the pain under decent control.  Otherwise he is in his usual state of health. ? ?Outpatient Medications Prior to Visit  ?Medication Sig Dispense Refill  ? acetaminophen (TYLENOL) 500 MG tablet Take 500 mg by mouth every 6 (six) hours as needed.    ? albuterol (VENTOLIN HFA) 108 (90 Base) MCG/ACT inhaler Inhale 2 puffs into the lungs every 4 (four) hours as needed for wheezing or shortness of breath (cough). 1 each 0  ? aspirin EC 81 MG tablet Take 81 mg by mouth daily.    ? b complex vitamins capsule Take 1 capsule by mouth daily.    ? fenofibrate micronized (LOFIBRA) 67 MG capsule TAKE 1 CAPSULE (67 MG TOTAL) BY MOUTH DAILY BEFORE BREAKFAST. 90 capsule 3  ? gabapentin (NEURONTIN) 300 MG capsule Take 1 capsule (300 mg total) by mouth 2 (two) times daily. 60 capsule 3  ? Melatonin 200 MCG TABS Take by mouth.    ? Multiple Vitamin (MULTIVITAMIN WITH MINERALS) TABS tablet Take 1 tablet  by mouth daily.    ? niacin 500 MG tablet Take 500 mg by mouth at bedtime.    ? Omega-3 Fatty Acids (FISH OIL) 1000 MG CAPS Take 1,000 mg by mouth daily.    ? pravastatin (PRAVACHOL) 40 MG tablet Take 1 tablet (40 mg total) by mouth daily. 90 tablet 3  ? Probiotic Product (PROBIOTIC DAILY PO) Take by mouth daily.    ? valsartan (DIOVAN) 160 MG tablet Take 1 tablet (160 mg total) by mouth daily. 90 tablet 3  ? cyclobenzaprine (FLEXERIL) 10 MG tablet Take 1 tablet (10 mg total) by mouth 2 (two) times daily. 60 tablet 2  ? augmented betamethasone dipropionate (DIPROLENE-AF) 0.05 % cream  (Patient not taking: Reported on 03/07/2020)    ? COVID-19 At Home Antigen Test [facility] COVID-19 HOME TEST) KIT use as directed (Patient not taking: Reported on 08/15/2021) 2 kit 0  ? fluocinonide cream (LIDEX) 2.99 % Apply 1 application topically 2 (two) times daily as needed. (Patient not taking: Reported on 08/15/2021)    ? ipratropium (ATROVENT) 0.06 % nasal spray PLACE 2 SPRAYS INTO BOTH NOSTRILS 4 (FOUR) TIMES DAILY FOR UP TO 5-7 DAYS THEN STOP. (Patient not taking: Reported on 08/15/2021) 15 mL 2  ? triamcinolone cream (KENALOG) 0.1 % Apply 1 application topically daily as needed. (Patient not taking: Reported on 08/15/2021)    ? ?No facility-administered  medications prior to visit.  ? ? ?Review of Systems ?CNS: No confusion or sedation ?Cardiac: No angina or palpitations ?GI: No abdominal pain or constipation ?Constitutional: No nausea vomiting fevers or chills ? ?Objective:  ?BP (!) 105/94 (BP Location: Right Arm, Patient Position: Sitting, Cuff Size: Normal)   Pulse 92   Temp 97.6 ?F (36.4 ?C) (Temporal)   Resp 16   Ht '5\' 9"'  (1.753 m)   Wt 245 lb (111.1 kg)   SpO2 97%   BMI 36.18 kg/m?  ? ? ?BP Readings from Last 3 Encounters:  ?08/15/21 (!) 105/94  ?08/09/21 132/88  ?03/29/21 (!) 152/82  ? ? ? ?Wt Readings from Last 3 Encounters:  ?08/15/21 245 lb (111.1 kg)  ?08/09/21 249 lb 3.2 oz (113 kg)  ?03/29/21 242 lb (109.8 kg)   ? ? ? ?Physical Exam ?Pt is alert and oriented ?PERRL EOMI ?HEART IS RRR no murmur or rub ?LCTA no wheezing or rales ?MUSCULOSKELETAL reveals 3 trigger points in the left mid body trapezius.  These do radiate to the left upper lateral arm and are the primary pain generators he is reporting.  They are reproducible.  Muscle tone and bulk is good good range of motion at the atlantooccipital joint and some pain on extension but good range of motion of the left glenohumeral joint and no evidence of any muscle weakness to the upper extremities on examination today. ? ?Labs ? ?Lab Results  ?Component Value Date  ? HGBA1C 5.6 11/09/2020  ? HGBA1C 5.5 10/28/2019  ? HGBA1C 5.4 05/29/2017  ? ?Lab Results  ?Component Value Date  ? LDLCALC 102 (H) 11/09/2020  ? CREATININE 0.99 11/09/2020  ? ? ?-------------------------------------------------------------------------------------------------------------------- ?Lab Results  ?Component Value Date  ? WBC 8.5 11/09/2020  ? HGB 15.8 11/09/2020  ? HCT 49.6 11/09/2020  ? PLT 276 11/09/2020  ? GLUCOSE 100 (H) 11/09/2020  ? CHOL 173 11/09/2020  ? TRIG 124 11/09/2020  ? HDL 48 11/09/2020  ? LDLCALC 102 (H) 11/09/2020  ? ALT 23 11/09/2020  ? AST 17 11/09/2020  ? NA 140 11/09/2020  ? K 4.7 11/09/2020  ? CL 105 11/09/2020  ? CREATININE 0.99 11/09/2020  ? BUN 15 11/09/2020  ? CO2 27 11/09/2020  ? PSA 2.03 11/09/2020  ? HGBA1C 5.6 11/09/2020  ? ? ?--------------------------------------------------------------------------------------------------------------------- ?No results found. ? ? ?Assessment & Plan:  ? ?Isaiah Johnson was seen today for neck pain and shoulder pain. ? ?Diagnoses and all orders for this visit: ? ?Cervicalgia ?-     dexamethasone (DECADRON) injection 10 mg ?-     ropivacaine (PF) 2 mg/mL (0.2%) (NAROPIN) injection 10 mL ? ?Chronic neck pain ?-     dexamethasone (DECADRON) injection 10 mg ?-     ropivacaine (PF) 2 mg/mL (0.2%) (NAROPIN) injection 10 mL ? ?Cervical radicular  pain ? ?DDD (degenerative disc disease), cervical ? ?Cervical facet syndrome ? ?Cervical radiculitis ? ?Upper extremity weakness ? ?Right arm pain ? ?Other orders ?-     cyclobenzaprine (FLEXERIL) 10 MG tablet; Take 1 tablet (10 mg total) by mouth 2 (two) times daily. ?-     traMADol (ULTRAM) 50 MG tablet; Take 2 tablets (100 mg total) by mouth 3 (three) times daily. ? ? ?   ? ? ?---------------------------------------------------------------------------------------------------------------------- ? ?Problem List Items Addressed This Visit   ? ?  ? Unprioritized  ? Chronic neck pain  ? Relevant Medications  ? cyclobenzaprine (FLEXERIL) 10 MG tablet  ? traMADol (ULTRAM) 50 MG tablet  ? Right arm  pain  ? ?Other Visit Diagnoses   ? ? Cervicalgia    -  Primary  ? Relevant Medications  ? dexamethasone (DECADRON) injection 10 mg (Completed)  ? ropivacaine (PF) 2 mg/mL (0.2%) (NAROPIN) injection 10 mL (Completed)  ? Cervical radicular pain      ? DDD (degenerative disc disease), cervical      ? Relevant Medications  ? dexamethasone (DECADRON) injection 10 mg (Completed)  ? cyclobenzaprine (FLEXERIL) 10 MG tablet  ? traMADol (ULTRAM) 50 MG tablet  ? Cervical facet syndrome      ? Cervical radiculitis      ? Relevant Medications  ? cyclobenzaprine (FLEXERIL) 10 MG tablet  ? Upper extremity weakness      ? ?  ? ? ? ? ?---------------------------------------------------------------------------------------------------------------------- ? ?1. Cervicalgia ?Continue with stretching strengthening exercises as tolerated and previously reviewed.  We will proceed with repeat trigger point injections today as he has responded favorably to these in the past.  At present we will schedule him for repeat injection if needed in approximately 3 months. ?- dexamethasone (DECADRON) injection 10 mg ?- ropivacaine (PF) 2 mg/mL (0.2%) (NAROPIN) injection 10 mL ? ?2. Chronic neck pain ?As above and continue with physical therapy exercises as  reviewed ?- dexamethasone (DECADRON) injection 10 mg ?- ropivacaine (PF) 2 mg/mL (0.2%) (NAROPIN) injection 10 mL ? ?3. Cervical radicular pain ?As above we will proceed with repeat trigger point injections today as revie

## 2021-08-16 NOTE — Telephone Encounter (Signed)
Post procedure call;  patient reports he is doing well.  

## 2021-08-19 ENCOUNTER — Other Ambulatory Visit: Payer: Self-pay | Admitting: Anesthesiology

## 2021-09-05 ENCOUNTER — Telehealth: Payer: Self-pay | Admitting: Anesthesiology

## 2021-09-05 ENCOUNTER — Other Ambulatory Visit: Payer: Self-pay

## 2021-09-05 MED ORDER — GABAPENTIN 300 MG PO CAPS: 300.0000 mg | ORAL_CAPSULE | Freq: Two times a day (BID) | ORAL | 3 refills | Status: DC

## 2021-09-05 NOTE — Telephone Encounter (Signed)
Refill request sent to Dr Adams 

## 2021-09-05 NOTE — Telephone Encounter (Signed)
Patient states his pharmacy does not have scripts for gabapentin or meloxocam. Did not get sent in on his appt. Please advise patient once they are sent in to pharmacy

## 2021-09-06 MED ORDER — MELOXICAM 7.5 MG PO TABS: 7.5000 mg | ORAL_TABLET | Freq: Two times a day (BID) | ORAL | 5 refills | Status: DC | PRN

## 2021-09-06 NOTE — Addendum Note (Signed)
Addended by: Yevette Edwards on: 09/06/2021 01:39 PM   Modules accepted: Orders

## 2021-09-12 ENCOUNTER — Encounter: Payer: Self-pay | Admitting: Family Medicine

## 2021-09-18 ENCOUNTER — Other Ambulatory Visit: Payer: Self-pay | Admitting: Anesthesiology

## 2021-09-22 ENCOUNTER — Telehealth: Payer: Self-pay | Admitting: Anesthesiology

## 2021-09-22 NOTE — Telephone Encounter (Signed)
Patient lvmail yesterday; at 4:37 stating he needs a change to gabapentin and his meloxicam needs PA. Please call patient

## 2021-09-22 NOTE — Telephone Encounter (Signed)
Patient needs to schedule an appointment to change medications.

## 2021-09-25 ENCOUNTER — Other Ambulatory Visit: Payer: Self-pay

## 2021-09-25 NOTE — Telephone Encounter (Signed)
Pharmacy states that the patient needs a new prescription for his Meloxicam, not a PA.  Will discuss with Dr Pernell Dupre when he comes into office.

## 2021-09-25 NOTE — Telephone Encounter (Signed)
Refill request for meloxicam sent to Dr Pernell Dupre

## 2021-09-26 MED ORDER — GABAPENTIN 300 MG PO CAPS: 300.0000 mg | ORAL_CAPSULE | Freq: Four times a day (QID) | ORAL | 3 refills | Status: DC

## 2021-09-26 MED ORDER — TRAMADOL HCL 50 MG PO TABS: 100.0000 mg | ORAL_TABLET | Freq: Three times a day (TID) | ORAL | 2 refills | Status: AC

## 2021-09-26 MED ORDER — MELOXICAM 7.5 MG PO TABS: 7.5000 mg | ORAL_TABLET | Freq: Two times a day (BID) | ORAL | 5 refills | Status: AC | PRN

## 2021-09-26 NOTE — Addendum Note (Signed)
Addended by: Yevette Edwards on: 09/26/2021 09:04 AM   Modules accepted: Orders

## 2021-09-26 NOTE — Telephone Encounter (Signed)
Discussed with Dr Pernell Dupre and he states he will take care of it.

## 2021-10-18 ENCOUNTER — Ambulatory Visit: Payer: No Typology Code available for payment source | Attending: Anesthesiology | Admitting: Anesthesiology

## 2021-10-18 ENCOUNTER — Encounter: Payer: Self-pay | Admitting: Anesthesiology

## 2021-10-18 VITALS — BP 143/94 | HR 95 | Temp 97.0°F | Resp 18 | Ht 69.0 in | Wt 240.0 lb

## 2021-10-18 DIAGNOSIS — M5412 Radiculopathy, cervical region: Secondary | ICD-10-CM | POA: Insufficient documentation

## 2021-10-18 DIAGNOSIS — M47812 Spondylosis without myelopathy or radiculopathy, cervical region: Secondary | ICD-10-CM | POA: Diagnosis present

## 2021-10-18 DIAGNOSIS — M79601 Pain in right arm: Secondary | ICD-10-CM | POA: Diagnosis present

## 2021-10-18 DIAGNOSIS — G8929 Other chronic pain: Secondary | ICD-10-CM | POA: Diagnosis present

## 2021-10-18 DIAGNOSIS — M5030 Other cervical disc degeneration, unspecified cervical region: Secondary | ICD-10-CM | POA: Diagnosis present

## 2021-10-18 DIAGNOSIS — M542 Cervicalgia: Secondary | ICD-10-CM | POA: Diagnosis not present

## 2021-10-18 MED ORDER — DEXAMETHASONE SODIUM PHOSPHATE 10 MG/ML IJ SOLN
INTRAMUSCULAR | Status: AC
  Filled 2021-10-18: qty 1

## 2021-10-18 MED ORDER — ROPIVACAINE HCL 2 MG/ML IJ SOLN
INTRAMUSCULAR | Status: AC
  Filled 2021-10-18: qty 20

## 2021-10-18 NOTE — Progress Notes (Signed)
Nursing Pain Medication Assessment:  Safety precautions to be maintained throughout the outpatient stay will include: orient to surroundings, keep bed in low position, maintain call bell within reach at all times, provide assistance with transfer out of bed and ambulation.  Medication Inspection Compliance: Pill count conducted under aseptic conditions, in front of the patient. Neither the pills nor the bottle was removed from the patient's sight at any time. Once count was completed pills were immediately returned to the patient in their original bottle.  Medication: Tramadol (Ultram) Pill/Patch Count: 15/180 Pill/Patch Appearance: Markings consistent with prescribed medication Bottle Appearance: Standard pharmacy container. Clearly labeled. Filled Date: 41 / 5 / 2023 Last Medication intake:  Today

## 2021-10-18 NOTE — Progress Notes (Signed)
Subjective:  Patient ID: Isaiah Johnson, male    DOB: 05/07/1965  Age: 56 y.o. MRN: [id number]  CC: Arm Pain (right)   Procedure: Trigger point injection x2 to the right trapezius x1 to the left trapezius  HPI Isaiah Johnson presents for reevaluation.  He continues to have troubles with cervicalgia type pain involving the bilateral trapezius muscles.  He has had more right arm pain recently with radiation of this neck and shoulder pain into the right upper lateral arm.  Occasionally it radiates down into the ring and little finger on the right hand with some associated numbness and tingling sometimes worse when he is sleeping.  He is trying to do his physical therapy exercises and lift some weight but pays the price for this.  He takes his medications as prescribed and these continue to give him good relief but this pain has been unfortunately intractable at times.  It has responded favorably to trigger point injections as of recently and generally he gets 6 weeks of about 75% improvement in the spasming and pain following the trigger point injection.  He desires to proceed with that today.  No side effects with his medications are noted  Outpatient Medications Prior to Visit  Medication Sig Dispense Refill   acetaminophen (TYLENOL) 500 MG tablet Take 500 mg by mouth every 6 (six) hours as needed.     albuterol (VENTOLIN HFA) 108 (90 Base) MCG/ACT inhaler Inhale 2 puffs into the lungs every 4 (four) hours as needed for wheezing or shortness of breath (cough). 1 each 0   aspirin EC 81 MG tablet Take 81 mg by mouth daily.     b complex vitamins capsule Take 1 capsule by mouth daily.     cyclobenzaprine (FLEXERIL) 10 MG tablet Take 1 tablet (10 mg total) by mouth 2 (two) times daily. 60 tablet 2   fenofibrate micronized (LOFIBRA) 67 MG capsule TAKE 1 CAPSULE (67 MG TOTAL) BY MOUTH DAILY BEFORE BREAKFAST. 90 capsule 3   gabapentin (NEURONTIN) 300 MG capsule Take 1 capsule (300 mg total) by mouth 4  (four) times daily. 120 capsule 3   Melatonin 200 MCG TABS Take by mouth.     meloxicam (MOBIC) 7.5 MG tablet Take 1 tablet (7.5 mg total) by mouth 2 (two) times daily as needed for pain. 60 tablet 5   Multiple Vitamin (MULTIVITAMIN WITH MINERALS) TABS tablet Take 1 tablet by mouth daily.     niacin 500 MG tablet Take 500 mg by mouth at bedtime.     Omega-3 Fatty Acids (FISH OIL) 1000 MG CAPS Take 1,000 mg by mouth daily.     Probiotic Product (PROBIOTIC DAILY PO) Take by mouth daily.     traMADol (ULTRAM) 50 MG tablet Take 2 tablets (100 mg total) by mouth 3 (three) times daily. 180 tablet 2   valsartan (DIOVAN) 160 MG tablet Take 1 tablet (160 mg total) by mouth daily. 90 tablet 3   augmented betamethasone dipropionate (DIPROLENE-AF) 0.05 % cream  (Patient not taking: Reported on 03/07/2020)     COVID-19 At Home Antigen Test (CARESTART COVID-19 HOME TEST) KIT use as directed (Patient not taking: Reported on 08/15/2021) 2 kit 0   fluocinonide cream (LIDEX) 4.23 % Apply 1 application topically 2 (two) times daily as needed. (Patient not taking: Reported on 08/15/2021)     ipratropium (ATROVENT) 0.06 % nasal spray PLACE 2 SPRAYS INTO BOTH NOSTRILS 4 (FOUR) TIMES DAILY FOR UP TO 5-7 DAYS THEN STOP. (Patient not taking:  Reported on 08/15/2021) 15 mL 2   pravastatin (PRAVACHOL) 40 MG tablet Take 1 tablet (40 mg total) by mouth daily. (Patient not taking: Reported on 10/18/2021) 90 tablet 3   triamcinolone cream (KENALOG) 0.1 % Apply 1 application topically daily as needed. (Patient not taking: Reported on 08/15/2021)     No facility-administered medications prior to visit.    Review of Systems CNS: No confusion or sedation Cardiac: No angina or palpitations GI: No abdominal pain or constipation Constitutional: No nausea vomiting fevers or chills  Objective:  BP (!) 143/94   Pulse 95   Temp (!) 97 F (36.1 C)   Resp 18   Ht _0  (1.753 m)   Wt 240 lb (108.9 kg)   SpO2 95%   BMI 35.44 kg/m     BP Readings from Last 3 Encounters:  10/18/21 (!) 143/94  08/15/21 (!) 105/94  08/09/21 132/88     Wt Readings from Last 3 Encounters:  10/18/21 240 lb (108.9 kg)  08/15/21 245 lb (111.1 kg)  08/09/21 249 lb 3.2 oz (113 kg)     Physical Exam Pt is alert and oriented PERRL EOMI HEART IS RRR no murmur or rub LCTA no wheezing or rales MUSCULOSKELETAL 2 trigger points involving the proximal right trapezius and proximal left trapezius.  His muscle tone and bulk to the upper extremities is good and I would be right this at 5/5 both proximal and distal to the bicep tricep wrists and hands.  Hand grasp strength is good.  Labs  Lab Results  Component Value Date   HGBA1C 5.6 11/09/2020   HGBA1C 5.5 10/28/2019   HGBA1C 5.4 05/29/2017   Lab Results  Component Value Date   LDLCALC 102 (H) 11/09/2020   CREATININE 0.99 11/09/2020    -------------------------------------------------------------------------------------------------------------------- Lab Results  Component Value Date   WBC 8.5 11/09/2020   HGB 15.8 11/09/2020   HCT 49.6 11/09/2020   PLT 276 11/09/2020   GLUCOSE 100 (H) 11/09/2020   CHOL 173 11/09/2020   TRIG 124 11/09/2020   HDL 48 11/09/2020   LDLCALC 102 (H) 11/09/2020   ALT 23 11/09/2020   AST 17 11/09/2020   NA 140 11/09/2020   K 4.7 11/09/2020   CL 105 11/09/2020   CREATININE 0.99 11/09/2020   BUN 15 11/09/2020   CO2 27 11/09/2020   PSA 2.03 11/09/2020   HGBA1C 5.6 11/09/2020    --------------------------------------------------------------------------------------------------------------------- No results found.   Assessment & Plan:   Isaiah Johnson was seen today for arm pain.  Diagnoses and all orders for this visit:  Cervicalgia  Chronic neck pain  Cervical radicular pain  DDD (degenerative disc disease), cervical  Cervical facet syndrome  Cervical radiculitis  Right arm  pain        ----------------------------------------------------------------------------------------------------------------------  Problem List Items Addressed This Visit       Unprioritized   Chronic neck pain   Right arm pain   Other Visit Diagnoses     Cervicalgia    -  Primary   Cervical radicular pain       DDD (degenerative disc disease), cervical       Cervical facet syndrome       Cervical radiculitis             ----------------------------------------------------------------------------------------------------------------------  1. Cervicalgia Continue stretching exercises as reviewed today with band work and light weights.  We will proceed with a repeat trigger point injection to the right to trapezius trigger point and left.  Continue muscle relaxants  and current medication management with return scheduled in approximately 4 to 6 weeks for repeat injection if indicated.  2. Chronic neck pain As above  3. Cervical radicular pain Continue with physical therapy and TENS application as reviewed  4. DDD (degenerative disc disease), cervical   5. Cervical facet syndrome   6. Cervical radiculitis   7. Right arm pain As above    ----------------------------------------------------------------------------------------------------------------------  I am having Patriciaann Clan "[name]" maintain his multivitamin with minerals, aspirin EC, niacin, Fish Oil, fluocinonide cream, triamcinolone cream, b complex vitamins, Probiotic Product (PROBIOTIC DAILY PO), acetaminophen, Melatonin, augmented betamethasone dipropionate, albuterol, Carestart COVID-19 Home Test, pravastatin, fenofibrate micronized, ipratropium, valsartan, cyclobenzaprine, meloxicam, gabapentin, and traMADol.   No orders of the defined types were placed in this encounter.  Patient's Medications  New Prescriptions   No medications on file  Previous Medications   ACETAMINOPHEN (TYLENOL) 500 MG  TABLET    Take 500 mg by mouth every 6 (six) hours as needed.   ALBUTEROL (VENTOLIN HFA) 108 (90 BASE) MCG/ACT INHALER    Inhale 2 puffs into the lungs every 4 (four) hours as needed for wheezing or shortness of breath (cough).   ASPIRIN EC 81 MG TABLET    Take 81 mg by mouth daily.   AUGMENTED BETAMETHASONE DIPROPIONATE (DIPROLENE-AF) 0.05 % CREAM       B COMPLEX VITAMINS CAPSULE    Take 1 capsule by mouth daily.   COVID-19 AT HOME ANTIGEN TEST (CARESTART COVID-19 HOME TEST) KIT    use as directed   CYCLOBENZAPRINE (FLEXERIL) 10 MG TABLET    Take 1 tablet (10 mg total) by mouth 2 (two) times daily.   FENOFIBRATE MICRONIZED (LOFIBRA) 67 MG CAPSULE    TAKE 1 CAPSULE (67 MG TOTAL) BY MOUTH DAILY BEFORE BREAKFAST.   FLUOCINONIDE CREAM (LIDEX) 0.05 %    Apply 1 application topically 2 (two) times daily as needed.   GABAPENTIN (NEURONTIN) 300 MG CAPSULE    Take 1 capsule (300 mg total) by mouth 4 (four) times daily.   IPRATROPIUM (ATROVENT) 0.06 % NASAL SPRAY    PLACE 2 SPRAYS INTO BOTH NOSTRILS 4 (FOUR) TIMES DAILY FOR UP TO 5-7 DAYS THEN STOP.   MELATONIN 200 MCG TABS    Take by mouth.   MELOXICAM (MOBIC) 7.5 MG TABLET    Take 1 tablet (7.5 mg total) by mouth 2 (two) times daily as needed for pain.   MULTIPLE VITAMIN (MULTIVITAMIN WITH MINERALS) TABS TABLET    Take 1 tablet by mouth daily.   NIACIN 500 MG TABLET    Take 500 mg by mouth at bedtime.   OMEGA-3 FATTY ACIDS (FISH OIL) 1000 MG CAPS    Take 1,000 mg by mouth daily.   PRAVASTATIN (PRAVACHOL) 40 MG TABLET    Take 1 tablet (40 mg total) by mouth daily.   PROBIOTIC PRODUCT (PROBIOTIC DAILY PO)    Take by mouth daily.   TRAMADOL (ULTRAM) 50 MG TABLET    Take 2 tablets (100 mg total) by mouth 3 (three) times daily.   TRIAMCINOLONE CREAM (KENALOG) 0.1 %    Apply 1 application topically daily as needed.   VALSARTAN (DIOVAN) 160 MG TABLET    Take 1 tablet (160 mg total) by mouth daily.  Modified Medications   No medications on file  Discontinued  Medications   No medications on file   ----------------------------------------------------------------------------------------------------------------------  Follow-up: Return in about 6 weeks (around 11/29/2021) for evaluation, procedure, med refill.  Trigger point injection: The area  overlying the aforementioned trigger points were prepped with alcohol. They were then injected with a 25-gauge needle with 4 cc of ropivacaine 0.2% and Decadron 4 mg at each site after negative aspiration for heme. This was performed after informed consent was obtained and risks and benefits reviewed. She tolerated this procedure without difficulty and was convalesced and discharged to home in stable condition for follow-up as mentioned.  _0  Andree Elk, MD@   Molli Barrows, MD

## 2021-10-18 NOTE — Patient Instructions (Signed)

## 2021-10-19 ENCOUNTER — Telehealth: Payer: Self-pay | Admitting: *Deleted

## 2021-10-19 NOTE — Telephone Encounter (Signed)
Post procedure call;  patient reports that he is doing well.  

## 2021-10-23 ENCOUNTER — Encounter: Payer: Self-pay | Admitting: Family Medicine

## 2021-12-07 ENCOUNTER — Telehealth: Payer: Self-pay | Admitting: Anesthesiology

## 2021-12-07 NOTE — Telephone Encounter (Signed)
PT stated that when he went to pick up his Gabapentin prescription refill was incorrect. PT stated that he only got 60 and supposed 120 pills. PT stated that he will leaving out of town and wanted to get this take care of, so he has enough meds. Please give patient a call. Thanks.

## 2021-12-07 NOTE — Telephone Encounter (Signed)
Called patient and informed him that Dr Pernell Dupre had written prescription for 120 pills that he would have to call his pharm and find out why they didn't fill them . He stated that on the bottle it had 120, Patient with understanding.

## 2021-12-30 ENCOUNTER — Other Ambulatory Visit: Payer: Self-pay | Admitting: Family Medicine

## 2021-12-30 DIAGNOSIS — E7849 Other hyperlipidemia: Secondary | ICD-10-CM

## 2022-01-01 NOTE — Telephone Encounter (Signed)
Requested medications are due for refill today.  yes  Requested medications are on the active medications list.  yes  Last refill. 12/09/2020 #90 3 rf  Future visit scheduled.   no  Notes to clinic.  Labs are expired.    Requested Prescriptions  Pending Prescriptions Disp Refills   fenofibrate micronized (LOFIBRA) 67 MG capsule [Pharmacy Med Name: FENOFIBRATE 67 MG CAPSULE] 90 capsule 3    Sig: TAKE 1 CAPSULE (67 MG TOTAL) BY MOUTH DAILY BEFORE BREAKFAST.     Cardiovascular:  Antilipid - Fibric Acid Derivatives Failed - 12/30/2021  9:31 AM      Failed - ALT in normal range and within 360 days    ALT  Date Value Ref Range Status  11/09/2020 23 9 - 46 U/L Final         Failed - AST in normal range and within 360 days    AST  Date Value Ref Range Status  11/09/2020 17 10 - 35 U/L Final         Failed - Cr in normal range and within 360 days    Creat  Date Value Ref Range Status  11/09/2020 0.99 0.70 - 1.30 mg/dL Final         Failed - HGB in normal range and within 360 days    Hemoglobin  Date Value Ref Range Status  11/09/2020 15.8 13.2 - 17.1 g/dL Final         Failed - HCT in normal range and within 360 days    HCT  Date Value Ref Range Status  11/09/2020 49.6 38.5 - 50.0 % Final         Failed - PLT in normal range and within 360 days    Platelets  Date Value Ref Range Status  11/09/2020 276 140 - 400 Thousand/uL Final         Failed - WBC in normal range and within 360 days    WBC  Date Value Ref Range Status  11/09/2020 8.5 3.8 - 10.8 Thousand/uL Final         Failed - eGFR is 30 or above and within 360 days    GFR, Est African American  Date Value Ref Range Status  10/28/2019 119 > OR = 60 mL/min/1.77m Final   GFR, Est Non African American  Date Value Ref Range Status  10/28/2019 103 > OR = 60 mL/min/1.774mFinal   eGFR  Date Value Ref Range Status  11/09/2020 91 > OR = 60 mL/min/1.7364minal    Comment:    The eGFR is based on the CKD-EPI  2021 equation. To calculate  the new eGFR from a previous Creatinine or Cystatin C result, go to https://www.kidney.org/professionals/ kdoqi/gfr%5Fcalculator          Failed - Lipid Panel in normal range within the last 12 months    Cholesterol  Date Value Ref Range Status  11/09/2020 173 <200 mg/dL Final   LDL Cholesterol (Calc)  Date Value Ref Range Status  11/09/2020 102 (H) mg/dL (calc) Final    Comment:    Reference range: <100 . Desirable range <100 mg/dL for primary prevention;   <70 mg/dL for patients with CHD or diabetic patients  with > or = 2 CHD risk factors. . LMarland KitchenL-C is now calculated using the Martin-Hopkins  calculation, which is a validated novel method providing  better accuracy than the Friedewald equation in the  estimation of LDL-C.  MarCresenciano Genre al. JAMAnnamaria [id number]  (http://education.[employer].com/faq/FAQ164)  HDL  Date Value Ref Range Status  11/09/2020 48 > OR = 40 mg/dL Final   Triglycerides  Date Value Ref Range Status  11/09/2020 124 <150 mg/dL Final         Passed - Valid encounter within last 12 months    Recent Outpatient Visits           4 months ago Essential hypertension   [facility] Olin Hauser, DO   9 months ago Essential hypertension   [place], Devonne Doughty, DO   1 year ago Annual physical exam   [facility] Olin Hauser, DO   1 year ago Appointment canceled by hospital   [place], [place]   1 year ago Acute non-recurrent maxillary sinusitis   [facility] [place], Devonne Doughty, DO

## 2022-01-14 ENCOUNTER — Encounter: Payer: Self-pay | Admitting: Family Medicine

## 2022-01-15 ENCOUNTER — Ambulatory Visit: Payer: Self-pay | Admitting: *Deleted

## 2022-01-15 NOTE — Telephone Encounter (Signed)
  Chief Complaint: requesting appt. Covid positive 01/14/22 Symptoms: fatigue, cough, congestion, mild sob with exertion, runny nose yellow mucus, body aches, hot flashes  Frequency: sx started 01/13/22 Pertinent Negatives: Patient denies chest pain no difficulty breathing no fever Disposition: [] ED /[] Urgent Care (no appt availability in office) / [x] Appointment(In office/virtual)/ []  [facility]/ [] Home Care/ [] Refused Recommended Disposition /[] [facility]/ []  Follow-up with PCP Additional Notes:   Scheduled my chart VV for tomorrow. Please advise if patient can be seen today.     Reason for Disposition  MILD difficulty breathing (e.g., minimal/no SOB at rest, SOB with walking, pulse <100)    4- 24 hours  Answer Assessment - Initial Assessment Questions 1. COVID-19 DIAGNOSIS: "How do you know that you have COVID?" (e.g., positive lab test or self-test, diagnosed by doctor or NP/PA, symptoms after exposure).     At home test 01/14/22 2. COVID-19 EXPOSURE: "Was there any known exposure to COVID before the symptoms began?" CDC Definition of close contact: within 6 feet (2 meters) for a total of 15 minutes or more over a 24-hour period.      Yes mother in law  3. ONSET: "When did the COVID-19 symptoms start?"      Saturday 01/13/22 4. WORST SYMPTOM: "What is your worst symptom?" (e.g., cough, fever, shortness of breath, muscle aches)     Body aches hot flashes fatigue, runny nose yellow mucus, mild sob 5. COUGH: "Do you have a cough?" If Yes, ask: "How bad is the cough?"       Yes  6. FEVER: "Do you have a fever?" If Yes, ask: "What is your temperature, how was it measured, and when did it start?"     no 7. RESPIRATORY STATUS: "Describe your breathing?" (e.g., normal; shortness of breath, wheezing, unable to speak)      Mild sob with exertion 8. BETTER-SAME-WORSE: "Are you getting better, staying the same or getting worse compared to yesterday?"  If getting worse, ask,  "In what way?"     na 9. OTHER SYMPTOMS: "Do you have any other symptoms?"  (e.g., chills, fatigue, headache, loss of smell or taste, muscle pain, sore throat)     Fatigue, body aches, runny nose, cough congestion 10. HIGH RISK DISEASE: "Do you have any chronic medical problems?" (e.g., asthma, heart or lung disease, weak immune system, obesity, etc.)       No  11. VACCINE: "Have you had the COVID-19 vaccine?" If Yes, ask: "Which one, how many shots, when did you get it?"       Yes  12. PREGNANCY: "Is there any chance you are pregnant?" "When was your last menstrual period?"       na 13. O2 SATURATION MONITOR:  "Do you use an oxygen saturation monitor (pulse oximeter) at home?" If Yes, ask "What is your reading (oxygen level) today?" "What is your usual oxygen saturation reading?" (e.g., 95%)       na  Protocols used: Coronavirus (COVID-19) Diagnosed or Suspected-A-AH

## 2022-01-16 ENCOUNTER — Encounter: Payer: Self-pay | Admitting: Family Medicine

## 2022-01-16 ENCOUNTER — Telehealth (INDEPENDENT_AMBULATORY_CARE_PROVIDER_SITE_OTHER): Payer: No Typology Code available for payment source | Admitting: Family Medicine

## 2022-01-16 ENCOUNTER — Other Ambulatory Visit: Payer: Self-pay | Admitting: Family Medicine

## 2022-01-16 VITALS — Ht 69.0 in | Wt 240.0 lb

## 2022-01-16 DIAGNOSIS — U071 COVID-19: Secondary | ICD-10-CM

## 2022-01-16 DIAGNOSIS — J0100 Acute maxillary sinusitis, unspecified: Secondary | ICD-10-CM

## 2022-01-16 DIAGNOSIS — J029 Acute pharyngitis, unspecified: Secondary | ICD-10-CM | POA: Diagnosis not present

## 2022-01-16 DIAGNOSIS — J9801 Acute bronchospasm: Secondary | ICD-10-CM

## 2022-01-16 MED ORDER — ALBUTEROL SULFATE HFA 108 (90 BASE) MCG/ACT IN AERS: 2.0000 | INHALATION_SPRAY | RESPIRATORY_TRACT | 0 refills | Status: DC | PRN

## 2022-01-16 MED ORDER — PREDNISONE 10 MG PO TABS: ORAL_TABLET | ORAL | 0 refills | Status: DC

## 2022-01-16 MED ORDER — IPRATROPIUM BROMIDE 0.06 % NA SOLN: 2.0000 | Freq: Four times a day (QID) | NASAL | 2 refills | Status: DC

## 2022-01-16 MED ORDER — NIRMATRELVIR/RITONAVIR (PAXLOVID)TABLET: 3.0000 | ORAL_TABLET | Freq: Two times a day (BID) | ORAL | 0 refills | Status: AC

## 2022-01-16 NOTE — Progress Notes (Addendum)
Subjective:    Patient ID: Isaiah Johnson, male    DOB: [birth year], 56 y.o.   MRN: [id number]  Isaiah Johnson is a 56 y.o. male presenting on 01/16/2022 for Covid Positive  Virtual / Telehealth Encounter - Video Visit via [place] The purpose of this virtual visit is to provide medical care while limiting exposure to the novel coronavirus (COVID19) for both patient and office staff.  Consent was obtained for remote visit:  Yes.   Answered questions that patient had about telehealth interaction:  Yes.   I discussed the limitations, risks, security and privacy concerns of performing an evaluation and management service by video/telephone. I also discussed with the patient that there may be a patient responsible charge related to this service. The patient expressed understanding and agreed to proceed.  Patient Location: Home Provider Location: [facility] (Office)  Participants in virtual visit: - Patient: Isaiah Johnson" Isaiah Johnson - CMA: Orinda Kenner, CMA - Provider: Dr Parks Ranger   HPI  COVID19 Positive Infection Reports multiple sick contacts at home and in hospital with [place]. He had Flu shot on Wednesday, and his symptoms started Sat / Sun. nasal congestion , cough ,mild shortness of breath and exhausted feeling - Sinus drainage - Admits frontal headache Taking Tylenol twice a day. On Meloxicam PRN but asks about Ibuprofen. In past did well on Prednisone burst for respiratory conditions Needs new Albuterol PRN inhaler Out of work yesterday and today Still low grade fever      10/18/2021    9:17 AM 02/27/2021    1:03 PM 11/09/2020    8:24 AM  Depression screen PHQ 2/9  Decreased Interest 0 0 0  Down, Depressed, Hopeless 0 0 0  PHQ - 2 Score 0 0 0  Altered sleeping   2  Tired, decreased energy   0  Change in appetite   0  Feeling bad or failure about yourself    0  Trouble concentrating   0  Moving slowly or fidgety/restless   0  Suicidal thoughts   0   PHQ-9 Score   2  Difficult doing work/chores   Not difficult at all    Social History   Tobacco Use   Smoking status: Never   Smokeless tobacco: Never  Substance Use Topics   Alcohol use: Yes    Comment: 1 drink   Drug use: No    Review of Systems Per HPI unless specifically indicated above     Objective:    Ht '5\' 9"'  (1.753 m)   Wt 240 lb (108.9 kg)   BMI 35.44 kg/m   Wt Readings from Last 3 Encounters:  01/16/22 240 lb (108.9 kg)  10/18/21 240 lb (108.9 kg)  08/15/21 245 lb (111.1 kg)    Physical Exam  Note examination was completely remotely via video observation objective data only  Gen - well-appearing, no acute distress or apparent pain, comfortable HEENT - eyes appear clear without discharge or redness Heart/Lungs - cannot examine virtually - observed no evidence of coughing or labored breathing. Abd - cannot examine virtually  Skin - face visible today- no rash Neuro - awake, alert, oriented Psych - not anxious appearing   Results for orders placed or performed in visit on 11/09/20  COMPLETE METABOLIC PANEL WITH GFR  Result Value Ref Range   Glucose, Bld 100 (H) 65 - 99 mg/dL   BUN 15 7 - 25 mg/dL   Creat 0.99 0.70 - 1.30 mg/dL   eGFR 91 >  OR = 60 mL/min/1.47m   BUN/Creatinine Ratio NOT APPLICABLE 6 - 22 (calc)   Sodium 140 135 - 146 mmol/L   Potassium 4.7 3.5 - 5.3 mmol/L   Chloride 105 98 - 110 mmol/L   CO2 27 20 - 32 mmol/L   Calcium 9.5 8.6 - 10.3 mg/dL   Total Protein 6.4 6.1 - 8.1 g/dL   Albumin 4.3 3.6 - 5.1 g/dL   Globulin 2.1 1.9 - 3.7 g/dL (calc)   AG Ratio 2.0 1.0 - 2.5 (calc)   Total Bilirubin 0.3 0.2 - 1.2 mg/dL   Alkaline phosphatase (APISO) 69 35 - 144 U/L   AST 17 10 - 35 U/L   ALT 23 9 - 46 U/L  Lipid panel  Result Value Ref Range   Cholesterol 173 <200 mg/dL   HDL 48 > OR = 40 mg/dL   Triglycerides 124 <150 mg/dL   LDL Cholesterol (Calc) 102 (H) mg/dL (calc)   Total CHOL/HDL Ratio 3.6 <5.0 (calc)   Non-HDL Cholesterol  (Calc) 125 <130 mg/dL (calc)  CBC with Differential/Platelet  Result Value Ref Range   WBC 8.5 3.8 - 10.8 Thousand/uL   RBC 5.62 4.20 - 5.80 Million/uL   Hemoglobin 15.8 13.2 - 17.1 g/dL   HCT 49.6 38.5 - 50.0 %   MCV 88.3 80.0 - 100.0 fL   MCH 28.1 27.0 - 33.0 pg   MCHC 31.9 (L) 32.0 - 36.0 g/dL   RDW 12.6 11.0 - 15.0 %   Platelets 276 140 - 400 Thousand/uL   MPV 9.8 7.5 - 12.5 fL   Neutro Abs 5,406 1,500 - 7,800 cells/uL   Lymphs Abs 1,947 850 - 3,900 cells/uL   Absolute Monocytes 408 200 - 950 cells/uL   Eosinophils Absolute 680 (H) 15 - 500 cells/uL   Basophils Absolute 60 0 - 200 cells/uL   Neutrophils Relative % 63.6 %   Total Lymphocyte 22.9 %   Monocytes Relative 4.8 %   Eosinophils Relative 8.0 %   Basophils Relative 0.7 %  Hemoglobin A1c  Result Value Ref Range   Hgb A1c MFr Bld 5.6 <5.7 % of total Hgb   Mean Plasma Glucose 114 mg/dL   eAG (mmol/L) 6.3 mmol/L  PSA  Result Value Ref Range   PSA 2.03 < OR = 4.00 ng/mL      Assessment & Plan:   Problem List Items Addressed This Visit   None Visit Diagnoses     COVID-19 virus infection    -  Primary   Relevant Medications   nirmatrelvir/ritonavir EUA (PAXLOVID) 20 x 150 MG & 10 x 100MG TABS   predniSONE (DELTASONE) 10 MG tablet   Acute non-recurrent maxillary sinusitis       Relevant Medications   nirmatrelvir/ritonavir EUA (PAXLOVID) 20 x 150 MG & 10 x 100MG TABS   ipratropium (ATROVENT) 0.06 % nasal spray   predniSONE (DELTASONE) 10 MG tablet   Pharyngitis, unspecified etiology       Relevant Medications   ipratropium (ATROVENT) 0.06 % nasal spray   Cough due to bronchospasm       Relevant Medications   ipratropium (ATROVENT) 0.06 % nasal spray   albuterol (VENTOLIN HFA) 108 (90 Base) MCG/ACT inhaler   predniSONE (DELTASONE) 10 MG tablet        COVID19 positive Symptom 1st onset 01/14/22 Confirm home test positive 01/14/22 Mild to moderate symptoms currently. No red flags or dyspnea Risk factor  age 55+  Start Symptom management today, defer Paxlovid Start  Prednisone taper, hold meloxicam ibuprofen Inc dose Tylenol PRN Atrovent nasal spray Albuterol PRN  Work note given on MyChart  If not improved by Thursday 10/12 - rx sent may start Paxlovid, GFR >60, 5 day course as prescribed, regular dosing with normal kidney function GFR >60. Reviewed med interaction checker without interactions Counseling provided Supportive care OTC PRN Follow-up criteria given.   Meds ordered this encounter  Medications   nirmatrelvir/ritonavir EUA (PAXLOVID) 20 x 150 MG & 10 x 100MG TABS    Sig: Take 3 tablets by mouth 2 (two) times daily for 5 days. (Take nirmatrelvir 150 mg two tablets twice daily for 5 days and ritonavir 100 mg one tablet twice daily for 5 days) Patient GFR is >60    Dispense:  30 tablet    Refill:  0   ipratropium (ATROVENT) 0.06 % nasal spray    Sig: Place 2 sprays into both nostrils 4 (four) times daily. For up to 5-7 days then stop.    Dispense:  15 mL    Refill:  2   albuterol (VENTOLIN HFA) 108 (90 Base) MCG/ACT inhaler    Sig: Inhale 2 puffs into the lungs every 4 (four) hours as needed for wheezing or shortness of breath (cough).    Dispense:  1 each    Refill:  0   predniSONE (DELTASONE) 10 MG tablet    Sig: Take 6 tabs with breakfast Day 1, 5 tabs Day 2, 4 tabs Day 3, 3 tabs Day 4, 2 tabs Day 5, 1 tab Day 6.    Dispense:  21 tablet    Refill:  0      Follow up plan: Return if symptoms worsen or fail to improve.  Patient verbalizes understanding with the above medical recommendations including the limitation of remote medical advice.  Specific follow-up and call-back criteria were given for patient to follow-up or seek medical care more urgently if needed.  Total duration of direct patient care provided via video conference: 15 minutes   [name], [facility] 01/16/2022, 11:27 AM

## 2022-01-16 NOTE — Patient Instructions (Addendum)
Thank you for coming to the office today.  For COVID treatment Start Prednisone taper STOP Meloxicam and Ibuprofen Take Atrovent nasal spray Back up plan Paxlovid by Thursday if needed.  Albuterol inhaler as needed.  Please schedule a Follow-up Appointment to: No follow-ups on file.  If you have any other questions or concerns, please feel free to call the office or send a message through [place]. You may also schedule an earlier appointment if necessary.  Additionally, you may be receiving a survey about your experience at our office within a few days to 1 week by e-mail or mail. We value your feedback.  [name], DO [place]

## 2022-01-16 NOTE — Telephone Encounter (Signed)
Requested Prescriptions  Pending Prescriptions Disp Refills  . levalbuterol (XOPENEX HFA) 45 MCG/ACT inhaler [Pharmacy Med Name: LEVALBUTEROL TAR HFA 45MCG INH]  0     Pulmonology:  Beta Agonists 2 Failed - 01/16/2022 11:47 AM      Failed - Last BP in normal range    BP Readings from Last 1 Encounters:  10/18/21 (!) 143/94         Passed - Last Heart Rate in normal range    Pulse Readings from Last 1 Encounters:  10/18/21 95         Passed - Valid encounter within last 12 months    Recent Outpatient Visits          Today COVID-19 virus infection   [place], Devonne Doughty, DO   5 months ago Essential hypertension   [facility] Olin Hauser, DO   10 months ago Essential hypertension   [place], Devonne Doughty, DO   1 year ago Annual physical exam   [facility] Olin Hauser, DO   1 year ago Appointment canceled by hospital   [place], Devonne Doughty, DO

## 2022-03-21 ENCOUNTER — Other Ambulatory Visit: Payer: Self-pay | Admitting: Family Medicine

## 2022-03-21 ENCOUNTER — Other Ambulatory Visit: Payer: Self-pay | Admitting: Anesthesiology

## 2022-03-21 DIAGNOSIS — U071 COVID-19: Secondary | ICD-10-CM

## 2022-03-21 DIAGNOSIS — J9801 Acute bronchospasm: Secondary | ICD-10-CM

## 2022-03-22 NOTE — Telephone Encounter (Signed)
Requested medication (s) are due for refill today: routing for review  Requested medication (s) are on the active medication list: yes  Last refill:  01/16/22  Future visit scheduled: no  Notes to clinic:  Unable to refill per protocol, cannot delegate. Routing for review.      Requested Prescriptions  Pending Prescriptions Disp Refills   predniSONE (DELTASONE) 10 MG tablet [Pharmacy Med Name: PREDNISONE 10 MG TABLET] 21 tablet 0    Sig: Take 6 tabs with breakfast Day 1, 5 tabs Day 2, 4 tabs Day 3, 3 tabs Day 4, 2 tabs Day 5, 1 tab Day 6.     Not Delegated - Endocrinology:  Oral Corticosteroids Failed - 03/21/2022  6:59 PM      Failed - This refill cannot be delegated      Failed - Manual Review: Eye exam for IOP if prolonged treatment      Failed - Glucose (serum) in normal range and within 180 days    Glucose, Bld  Date Value Ref Range Status  11/09/2020 100 (H) 65 - 99 mg/dL Final    Comment:    .            Fasting reference interval . For someone without known diabetes, a glucose value between 100 and 125 mg/dL is consistent with prediabetes and should be confirmed with a follow-up test. .          Failed - K in normal range and within 180 days    Potassium  Date Value Ref Range Status  11/09/2020 4.7 3.5 - 5.3 mmol/L Final         Failed - Na in normal range and within 180 days    Sodium  Date Value Ref Range Status  11/09/2020 140 135 - 146 mmol/L Final         Failed - Last BP in normal range    BP Readings from Last 1 Encounters:  10/18/21 (!) 143/94         Failed - Bone Mineral Density or Dexa Scan completed in the last 2 years      Passed - Valid encounter within last 6 months    Recent Outpatient Visits           2 months ago COVID-19 virus infection   [facility] [place], Netta Neat, DO   7 months ago Essential hypertension   [facility] [place], Netta Neat, DO   1 year ago Essential  hypertension   [facility] Charon, Netta Neat, DO   1 year ago Annual physical exam   [facility] Smitty Cords, DO   1 year ago Appointment canceled by hospital   [facility], Alexander J, DO               PAXLOVID, 300/100, 20 x 150 MG & 10 x 100MG  TBPK [Pharmacy Med Name: PAXLOVID 150(X2)-100MG PK (EUA)] 30 each     Sig: TAKE 3 TABLETS BY MOUTH 2 (TWO) TIMES DAILY FOR 5 DAYS. (TAKE NIRMATRELVIR 150 MG TWO TABLETS TWICE DAILY FOR 5 DAYS AND RITONAVIR 100 MG ONE TABLET TWICE DAILY FOR 5 DAYS) PATIENT GFR IS >60     Off-Protocol Failed - 03/21/2022  6:59 PM      Failed - Medication not assigned to a protocol, review manually.      Passed - Valid encounter within last 12 months    Recent Outpatient Visits  2 months ago COVID-19 virus infection   [facility] [place], Netta Neat, DO   7 months ago Essential hypertension   [facility] [place], Netta Neat, DO   1 year ago Essential hypertension   [facility] Althe[facility] Charon, Netta Neat, DO   1 year ago Annual physical exam   [facility] Smitty Cords, DO   1 year ago Appointment canceled by hospital   [facility] [place], Netta Neat, DO

## 2022-03-25 ENCOUNTER — Other Ambulatory Visit: Payer: Self-pay | Admitting: Anesthesiology

## 2022-03-29 ENCOUNTER — Ambulatory Visit: Payer: No Typology Code available for payment source | Attending: Anesthesiology | Admitting: Anesthesiology

## 2022-03-29 ENCOUNTER — Encounter: Payer: Self-pay | Admitting: Anesthesiology

## 2022-03-29 DIAGNOSIS — M79601 Pain in right arm: Secondary | ICD-10-CM | POA: Diagnosis not present

## 2022-03-29 DIAGNOSIS — M542 Cervicalgia: Secondary | ICD-10-CM | POA: Diagnosis not present

## 2022-03-29 DIAGNOSIS — M5030 Other cervical disc degeneration, unspecified cervical region: Secondary | ICD-10-CM | POA: Diagnosis not present

## 2022-03-29 DIAGNOSIS — M4722 Other spondylosis with radiculopathy, cervical region: Secondary | ICD-10-CM

## 2022-03-29 DIAGNOSIS — M47812 Spondylosis without myelopathy or radiculopathy, cervical region: Secondary | ICD-10-CM

## 2022-03-29 DIAGNOSIS — G8929 Other chronic pain: Secondary | ICD-10-CM

## 2022-03-29 DIAGNOSIS — M5412 Radiculopathy, cervical region: Secondary | ICD-10-CM

## 2022-03-29 MED ORDER — TRAMADOL HCL 50 MG PO TABS: 100.0000 mg | ORAL_TABLET | Freq: Three times a day (TID) | ORAL | 2 refills | Status: AC

## 2022-03-29 MED ORDER — CYCLOBENZAPRINE HCL 10 MG PO TABS: 10.0000 mg | ORAL_TABLET | Freq: Two times a day (BID) | ORAL | 2 refills | Status: DC

## 2022-03-29 MED ORDER — MELOXICAM 7.5 MG PO TABS: 7.5000 mg | ORAL_TABLET | Freq: Two times a day (BID) | ORAL | 2 refills | Status: AC | PRN

## 2022-03-29 MED ORDER — GABAPENTIN 300 MG PO CAPS: 300.0000 mg | ORAL_CAPSULE | Freq: Four times a day (QID) | ORAL | 3 refills | Status: DC

## 2022-03-29 NOTE — Progress Notes (Signed)
Virtual Visit via Telephone Note  I connected with Isaiah Johnson on 03/29/22 at  3:00 PM EST by telephone and verified that I am speaking with the correct person using two identifiers.  Location: Patient: Home Provider: Pain control center   I discussed the limitations, risks, security and privacy concerns of performing an evaluation and management service by telephone and the availability of in person appointments. I also discussed with the patient that there may be a patient responsible charge related to this service. The patient expressed understanding and agreed to proceed.   History of Present Illness: I spoke with Isaiah Johnson today via telephone as we were unable link for the video portion of the conference.  He reports that his neck shoulder and arm pain have been generally stable and the pain has been in good control with his medication management at present.  No change in the quality characteristic of distribution of the pain is noted.  He notes that it is worse with certain activities.  These been identified for the most part and he knows to limit them or avoid them entirely.  He takes his gabapentin 2 tablets in the late afternoon and 1 in the morning 1 at bedtime in addition to tramadol 2 tablets in the morning afternoon and evening and this combination works well for him.  Additionally he takes meloxicam 1 in the morning and 1 at night if needed and some Flexeril for spasming.  He knows to minimize his exposure to these medications as tolerated and is doing his stretching strengthening exercises.  No new change in upper extremity or lower extremity strength or function or bowel or bladder function is noted.  No side effects with the medication are reported.  Review of systems: General: No fevers or chills Pulmonary: No shortness of breath or dyspnea Cardiac: No angina or palpitations or lightheadedness GI: No abdominal pain or constipation Psych: No depression     Observations/Objective:  Current Outpatient Medications:    meloxicam (MOBIC) 7.5 MG tablet, Take 1 tablet (7.5 mg total) by mouth 2 (two) times daily as needed for pain., Disp: 60 tablet, Rfl: 2   traMADol (ULTRAM) 50 MG tablet, Take 2 tablets (100 mg total) by mouth 3 (three) times daily., Disp: 180 tablet, Rfl: 2   acetaminophen (TYLENOL) 500 MG tablet, Take 500 mg by mouth every 6 (six) hours as needed., Disp: , Rfl:    albuterol (VENTOLIN HFA) 108 (90 Base) MCG/ACT inhaler, Inhale 2 puffs into the lungs every 4 (four) hours as needed for wheezing or shortness of breath (cough)., Disp: 1 each, Rfl: 0   aspirin EC 81 MG tablet, Take 81 mg by mouth daily., Disp: , Rfl:    augmented betamethasone dipropionate (DIPROLENE-AF) 0.05 % cream, , Disp: , Rfl:    b complex vitamins capsule, Take 1 capsule by mouth daily., Disp: , Rfl:    COVID-19 At Home Antigen Test [facility] COVID-19 HOME TEST) KIT, use as directed (Patient not taking: Reported on 08/15/2021), Disp: 2 kit, Rfl: 0   cyclobenzaprine (FLEXERIL) 10 MG tablet, Take 1 tablet (10 mg total) by mouth 2 (two) times daily., Disp: 60 tablet, Rfl: 2   fenofibrate micronized (LOFIBRA) 67 MG capsule, TAKE 1 CAPSULE (67 MG TOTAL) BY MOUTH DAILY BEFORE BREAKFAST., Disp: 90 capsule, Rfl: 0   fluocinonide cream (LIDEX) 4.56 %, Apply 1 application topically 2 (two) times daily as needed. (Patient not taking: Reported on 08/15/2021), Disp: , Rfl:    gabapentin (NEURONTIN) 300 MG  capsule, Take 1 capsule (300 mg total) by mouth 4 (four) times daily., Disp: 120 capsule, Rfl: 3   ipratropium (ATROVENT) 0.06 % nasal spray, Place 2 sprays into both nostrils 4 (four) times daily. For up to 5-7 days then stop., Disp: 15 mL, Rfl: 2   Melatonin 200 MCG TABS, Take by mouth., Disp: , Rfl:    Multiple Vitamin (MULTIVITAMIN WITH MINERALS) TABS tablet, Take 1 tablet by mouth daily., Disp: , Rfl:    niacin 500 MG tablet, Take 500 mg by mouth at bedtime., Disp: , Rfl:     Omega-3 Fatty Acids (FISH OIL) 1000 MG CAPS, Take 1,000 mg by mouth daily., Disp: , Rfl:    pravastatin (PRAVACHOL) 40 MG tablet, Take 1 tablet (40 mg total) by mouth daily. (Patient not taking: Reported on 10/18/2021), Disp: 90 tablet, Rfl: 3   predniSONE (DELTASONE) 10 MG tablet, Take 6 tabs with breakfast Day 1, 5 tabs Day 2, 4 tabs Day 3, 3 tabs Day 4, 2 tabs Day 5, 1 tab Day 6., Disp: 21 tablet, Rfl: 0   Probiotic Product (PROBIOTIC DAILY PO), Take by mouth daily., Disp: , Rfl:    triamcinolone cream (KENALOG) 0.1 %, Apply 1 application topically daily as needed. (Patient not taking: Reported on 08/15/2021), Disp: , Rfl:    valsartan (DIOVAN) 160 MG tablet, Take 1 tablet (160 mg total) by mouth daily., Disp: 90 tablet, Rfl: 3   Past Medical History:  Diagnosis Date   Chicken pox    Colon polyps    H/O rubella    History of measles    Hyperlipidemia    Psoriasis     .m Assessment and Plan: 1. Cervicalgia   2. Chronic neck pain   3. Cervical radicular pain   4. DDD (degenerative disc disease), cervical   5. Cervical facet syndrome   6. Right arm pain   Based on our conversation I think is appropriate to refill his medications for the next 3 months.  No change in his pharmacologic regimen will be initiated.  We talked about some exercises to do and some activities to avoid.  Continue follow-up with his primary care physician for baseline medical care.  Will schedule him for return to clinic in 3 months.  Follow Up Instructions:    I discussed the assessment and treatment plan with the patient. The patient was provided an opportunity to ask questions and all were answered. The patient agreed with the plan and demonstrated an understanding of the instructions.   The patient was advised to call back or seek an in-person evaluation if the symptoms worsen or if the condition fails to improve as anticipated.  I provided 25 minutes of non-face-to-face time during this encounter.   Molli Barrows, MD

## 2022-04-02 ENCOUNTER — Other Ambulatory Visit: Payer: Self-pay | Admitting: Family Medicine

## 2022-04-02 DIAGNOSIS — E7849 Other hyperlipidemia: Secondary | ICD-10-CM

## 2022-04-04 NOTE — Telephone Encounter (Signed)
Requested medication (s) are due for refill today:routing for review  Requested medication (s) are on the active medication list: yes  Last refill:  01/01/22  Future visit scheduled: no  Notes to clinic:  Unable to refill per protocol due to failed labs, no updated results.      Requested Prescriptions  Pending Prescriptions Disp Refills   fenofibrate micronized (LOFIBRA) 67 MG capsule [Pharmacy Med Name: FENOFIBRATE 67 MG CAPSULE] 90 capsule 0    Sig: TAKE 1 CAPSULE (67 MG TOTAL) BY MOUTH DAILY BEFORE BREAKFAST.     Cardiovascular:  Antilipid - Fibric Acid Derivatives Failed - 04/02/2022  1:32 PM      Failed - ALT in normal range and within 360 days    ALT  Date Value Ref Range Status  11/09/2020 23 9 - 46 U/L Final         Failed - AST in normal range and within 360 days    AST  Date Value Ref Range Status  11/09/2020 17 10 - 35 U/L Final         Failed - Cr in normal range and within 360 days    Creat  Date Value Ref Range Status  11/09/2020 0.99 0.70 - 1.30 mg/dL Final         Failed - HGB in normal range and within 360 days    Hemoglobin  Date Value Ref Range Status  11/09/2020 15.8 13.2 - 17.1 g/dL Final         Failed - HCT in normal range and within 360 days    HCT  Date Value Ref Range Status  11/09/2020 49.6 38.5 - 50.0 % Final         Failed - PLT in normal range and within 360 days    Platelets  Date Value Ref Range Status  11/09/2020 276 140 - 400 Thousand/uL Final         Failed - WBC in normal range and within 360 days    WBC  Date Value Ref Range Status  11/09/2020 8.5 3.8 - 10.8 Thousand/uL Final         Failed - eGFR is 30 or above and within 360 days    GFR, Est African American  Date Value Ref Range Status  10/28/2019 119 > OR = 60 mL/min/1.37m Final   GFR, Est Non African American  Date Value Ref Range Status  10/28/2019 103 > OR = 60 mL/min/1.751mFinal   eGFR  Date Value Ref Range Status  11/09/2020 91 > OR = 60  mL/min/1.7370minal    Comment:    The eGFR is based on the CKD-EPI 2021 equation. To calculate  the new eGFR from a previous Creatinine or Cystatin C result, go to https://www.kidney.org/professionals/ kdoqi/gfr%5Fcalculator          Failed - Lipid Panel in normal range within the last 12 months    Cholesterol  Date Value Ref Range Status  11/09/2020 173 <200 mg/dL Final   LDL Cholesterol (Calc)  Date Value Ref Range Status  11/09/2020 102 (H) mg/dL (calc) Final    Comment:    Reference range: <100 . Desirable range <100 mg/dL for primary prevention;   <70 mg/dL for patients with CHD or diabetic patients  with > or = 2 CHD risk factors. . LMarland KitchenL-C is now calculated using the Martin-Hopkins  calculation, which is a validated novel method providing  better accuracy than the Friedewald equation in the  estimation of LDL-C.  MarHassell Done  SS et al. JAMA. 2103;128(11): 2061-2068  (http://education.[employer].com/faq/FAQ164)    HDL  Date Value Ref Range Status  11/09/2020 48 > OR = 40 mg/dL Final   Triglycerides  Date Value Ref Range Status  11/09/2020 124 <150 mg/dL Final         Passed - Valid encounter within last 12 months    Recent Outpatient Visits           2 months ago COVID-19 virus infection   [place], Devonne Doughty, DO   7 months ago Essential hypertension   [place], Devonne Doughty, DO   1 year ago Essential hypertension   [facility] Olin Hauser, DO   1 year ago Annual physical exam   [facility] Olin Hauser, DO   1 year ago Appointment canceled by hospital   [place], Devonne Doughty, DO

## 2022-05-10 ENCOUNTER — Other Ambulatory Visit: Payer: Self-pay | Admitting: Family Medicine

## 2022-05-10 DIAGNOSIS — J9801 Acute bronchospasm: Secondary | ICD-10-CM

## 2022-05-10 DIAGNOSIS — J029 Acute pharyngitis, unspecified: Secondary | ICD-10-CM

## 2022-05-10 DIAGNOSIS — J0100 Acute maxillary sinusitis, unspecified: Secondary | ICD-10-CM

## 2022-05-10 NOTE — Telephone Encounter (Signed)
Requested medication (s) are due for refill today: yes  Requested medication (s) are on the active medication list: yes  Last refill:  01/16/22  Future visit scheduled: yes  Notes to clinic:  [place]. DX Code Needed.      Requested Prescriptions  Pending Prescriptions Disp Refills   ipratropium (ATROVENT) 0.06 % nasal spray [Pharmacy Med Name: IPRATROPIUM 0.06% SPRAY]  1    Sig: Place 2 sprays into both nostrils 4 (four) times daily. For up to 5-7 days then stop.     Off-Protocol Failed - 05/10/2022 10:33 AM      Failed - Medication not assigned to a protocol, review manually.      Passed - Valid encounter within last 12 months    Recent Outpatient Visits           3 months ago COVID-19 virus infection   [facility] Olin Hauser, DO   9 months ago Essential hypertension   [place], [place], DO   1 year ago Essential hypertension   [place], DO   1 year ago Annual physical exam   [facility] Olin Hauser, DO   1 year ago Appointment canceled by hospital   [place], DO             Off-Protocol Failed - 05/10/2022 10:33 AM      Failed - Medication not assigned to a protocol, review manually.      Passed - Valid encounter within last 12 months    Recent Outpatient Visits           3 months ago COVID-19 virus infection   [facility] Olin Hauser, DO   9 months ago Essential hypertension   [place], [place], DO   1 year ago Essential hypertension   [place], DO   1 year ago Annual physical exam   [facility] Olin Hauser, DO   1  year ago Appointment canceled by hospital   [place], DO

## 2022-06-13 ENCOUNTER — Ambulatory Visit (INDEPENDENT_AMBULATORY_CARE_PROVIDER_SITE_OTHER): Payer: No Typology Code available for payment source

## 2022-06-13 ENCOUNTER — Encounter: Payer: Self-pay | Admitting: Podiatry

## 2022-06-13 ENCOUNTER — Ambulatory Visit (INDEPENDENT_AMBULATORY_CARE_PROVIDER_SITE_OTHER): Payer: No Typology Code available for payment source | Admitting: Podiatry

## 2022-06-13 DIAGNOSIS — M722 Plantar fascial fibromatosis: Secondary | ICD-10-CM

## 2022-06-13 DIAGNOSIS — M79671 Pain in right foot: Secondary | ICD-10-CM

## 2022-06-13 MED ORDER — TRIAMCINOLONE ACETONIDE 10 MG/ML IJ SUSP
10.0000 mg | Freq: Once | INTRAMUSCULAR | Status: AC
  Administered 2022-06-13: 10 mg

## 2022-06-13 NOTE — Progress Notes (Signed)
Subjective:   Patient ID: Isaiah Johnson, male   DOB: 58 y.o.   MRN: [id number]   HPI Patient states that his right heel has become very sore over the last few weeks he does not remember specific injury and states that he is having trouble walking on it at times.  Patient does have old orthotics does not smoke likes to be active   Review of Systems  All other systems reviewed and are negative.       Objective:  Physical Exam Vitals and nursing note reviewed.  Constitutional:      Appearance: He is well-developed.  Pulmonary:     Effort: Pulmonary effort is normal.  Musculoskeletal:        General: Normal range of motion.  Skin:    General: Skin is warm.  Neurological:     Mental Status: He is alert.     Neurovascular status intact muscle strength adequate range of motion adequate exquisite discomfort plantar aspect right heel at the insertional point tendon calcaneus inflammation fluid around the medial band     Assessment:  Acute Planter fasciitis right inflammation fluid     Plan:  H&P x-ray reviewed sterile prep injected the plantar fascia right 3 mg Kenalog 5 g Xylocaine instructed on physical therapy shoe gear modification reappoint as symptoms indicate  X-rays indicate small spur no indications of stress fracture arthritis

## 2022-06-14 ENCOUNTER — Other Ambulatory Visit: Payer: Self-pay | Admitting: Podiatry

## 2022-06-14 DIAGNOSIS — M722 Plantar fascial fibromatosis: Secondary | ICD-10-CM

## 2022-06-14 DIAGNOSIS — M79671 Pain in right foot: Secondary | ICD-10-CM

## 2022-06-27 ENCOUNTER — Encounter: Payer: Self-pay | Admitting: Podiatry

## 2022-06-27 ENCOUNTER — Ambulatory Visit (INDEPENDENT_AMBULATORY_CARE_PROVIDER_SITE_OTHER): Payer: No Typology Code available for payment source | Admitting: Podiatry

## 2022-06-27 DIAGNOSIS — M722 Plantar fascial fibromatosis: Secondary | ICD-10-CM

## 2022-06-27 MED ORDER — TRIAMCINOLONE ACETONIDE 10 MG/ML IJ SUSP
10.0000 mg | Freq: Once | INTRAMUSCULAR | Status: AC
  Administered 2022-06-27: 10 mg

## 2022-06-27 NOTE — Progress Notes (Signed)
Subjective:   Patient ID: Isaiah Johnson, male   DOB: 57 y.o.   MRN: [id number]   HPI Patient states he still getting a lot of pain in the bottom of his right heel and it is more proximal in the heel and it is hard to step down on   ROS      Objective:  Physical Exam  Neurovascular status intact exquisite discomfort plantar aspect right heel still present despite previous treatment     Assessment:  Continued acute Planter fasciitis right that did not respond to initial treatment     Plan:  H&P reviewed I went ahead today I did do sterile prep I reinjected from the lateral side into the center lateral tendon 3 mg Kenalog 5 mg Xylocaine and due to the intense discomfort I applied air fracture walker to completely immobilize and take all pressure off the bottom of the heel.  Reappoint to recheck

## 2022-07-25 ENCOUNTER — Encounter: Payer: Self-pay | Admitting: Podiatry

## 2022-07-25 ENCOUNTER — Ambulatory Visit (INDEPENDENT_AMBULATORY_CARE_PROVIDER_SITE_OTHER): Payer: No Typology Code available for payment source | Admitting: Podiatry

## 2022-07-25 DIAGNOSIS — M722 Plantar fascial fibromatosis: Secondary | ICD-10-CM | POA: Diagnosis not present

## 2022-07-25 MED ORDER — TRIAMCINOLONE ACETONIDE 10 MG/ML IJ SUSP
10.0000 mg | Freq: Once | INTRAMUSCULAR | Status: AC
  Administered 2022-07-25: 10 mg

## 2022-07-26 NOTE — Progress Notes (Signed)
Subjective:   Patient ID: Isaiah Johnson, male   DOB: 57 y.o.   MRN: [id number]   HPI Patient states doing quite a bit better still having discomfort but improved   ROS      Objective:  Physical Exam  Neurovascular status intact muscle strength adequate patient found to have continued discomfort in the plantar heel right with inflammation fluid buildup still noted around the medial band     Assessment:  Acute Planter fasciitis right improving but still present     Plan:  H&P done recommended the continuation of immobilization and due to 1 area of still intense discomfort I reinjected 3 mg Kenalog 5 mg Xylocaine and advised on reduced activity.  Reappoint to recheck

## 2022-07-31 ENCOUNTER — Encounter: Payer: Self-pay | Admitting: Anesthesiology

## 2022-07-31 ENCOUNTER — Ambulatory Visit: Payer: Self-pay | Attending: Anesthesiology | Admitting: Anesthesiology

## 2022-07-31 DIAGNOSIS — M47812 Spondylosis without myelopathy or radiculopathy, cervical region: Secondary | ICD-10-CM

## 2022-07-31 DIAGNOSIS — M5030 Other cervical disc degeneration, unspecified cervical region: Secondary | ICD-10-CM

## 2022-07-31 DIAGNOSIS — G894 Chronic pain syndrome: Secondary | ICD-10-CM

## 2022-07-31 DIAGNOSIS — Z79891 Long term (current) use of opiate analgesic: Secondary | ICD-10-CM

## 2022-07-31 DIAGNOSIS — M5412 Radiculopathy, cervical region: Secondary | ICD-10-CM

## 2022-07-31 DIAGNOSIS — M79601 Pain in right arm: Secondary | ICD-10-CM

## 2022-07-31 DIAGNOSIS — G8929 Other chronic pain: Secondary | ICD-10-CM

## 2022-07-31 DIAGNOSIS — F1190 Opioid use, unspecified, uncomplicated: Secondary | ICD-10-CM

## 2022-07-31 DIAGNOSIS — M542 Cervicalgia: Secondary | ICD-10-CM

## 2022-07-31 MED ORDER — GABAPENTIN 300 MG PO CAPS: 300.0000 mg | ORAL_CAPSULE | Freq: Four times a day (QID) | ORAL | 3 refills | Status: DC

## 2022-07-31 MED ORDER — TRAMADOL HCL 50 MG PO TABS: 50.0000 mg | ORAL_TABLET | Freq: Four times a day (QID) | ORAL | 2 refills | Status: DC

## 2022-07-31 MED ORDER — CYCLOBENZAPRINE HCL 10 MG PO TABS: 10.0000 mg | ORAL_TABLET | Freq: Two times a day (BID) | ORAL | 3 refills | Status: DC

## 2022-07-31 NOTE — Progress Notes (Signed)
Virtual Visit via Telephone Note  I connected with [name] on 07/31/22 at  2:45 PM EDT by telephone and verified that I am speaking with the correct person using two identifiers.  Location: Patient: Home Provider: Pain control center   I discussed the limitations, risks, security and privacy concerns of performing an evaluation and management service by telephone and the availability of in person appointments. I also discussed with the patient that there may be a patient responsible charge related to this service. The patient expressed understanding and agreed to proceed.   History of Present Illness: I spoke with Berna Spare via telephone as we were unable like for the video portion of the conference.  He reports that he is still having stable neck pain similar to what he is experienced over the past several years.  He knows that certain activities tend to aggravate the pain but that if he avoids these activities he is generally functional.  He is not having any trouble with any upper extremity weakness and is staying functional with the current medical regimen is on.  He takes his gabapentin on average 4 times a day in addition to tramadol 2 tablets in the morning 1 in the afternoon and 1 in the evening and Flexeril about twice a day to help with muscle spasm and discomfort.  He is trying to do his stretching strengthening exercises that he does feel are productive.  He tolerates the medications with no side effects reported.  Otherwise he is in his usual state of health at this time.  Review of systems: General: No fevers or chills Pulmonary: No shortness of breath or dyspnea Cardiac: No angina or palpitations or lightheadedness GI: No abdominal pain or constipation Psych: No depression    Observations/Objective:  Current Outpatient Medications:    traMADol (ULTRAM) 50 MG tablet, Take 1 tablet (50 mg total) by mouth 4 (four) times daily., Disp: 120 tablet, Rfl: 2   acetaminophen  (TYLENOL) 500 MG tablet, Take 500 mg by mouth every 6 (six) hours as needed., Disp: , Rfl:    albuterol (VENTOLIN HFA) 108 (90 Base) MCG/ACT inhaler, Inhale 2 puffs into the lungs every 4 (four) hours as needed for wheezing or shortness of breath (cough)., Disp: 1 each, Rfl: 0   aspirin EC 81 MG tablet, Take 81 mg by mouth daily., Disp: , Rfl:    augmented betamethasone dipropionate (DIPROLENE-AF) 0.05 % cream, , Disp: , Rfl:    b complex vitamins capsule, Take 1 capsule by mouth daily., Disp: , Rfl:    COVID-19 At Home Antigen Test [facility] COVID-19 HOME TEST) KIT, use as directed (Patient not taking: Reported on 08/15/2021), Disp: 2 kit, Rfl: 0   cyclobenzaprine (FLEXERIL) 10 MG tablet, Take 1 tablet (10 mg total) by mouth 2 (two) times daily., Disp: 60 tablet, Rfl: 3   fenofibrate micronized (LOFIBRA) 67 MG capsule, TAKE 1 CAPSULE (67 MG TOTAL) BY MOUTH DAILY BEFORE BREAKFAST., Disp: 90 capsule, Rfl: 1   fluocinonide cream (LIDEX) 0.05 %, Apply 1 application topically 2 (two) times daily as needed. (Patient not taking: Reported on 08/15/2021), Disp: , Rfl:    gabapentin (NEURONTIN) 300 MG capsule, Take 1 capsule (300 mg total) by mouth 4 (four) times daily., Disp: 120 capsule, Rfl: 3   ipratropium (ATROVENT) 0.06 % nasal spray, Place 2 sprays into both nostrils 4 (four) times daily. As needed., Disp: 45 mL, Rfl: 1   Melatonin 200 MCG TABS, Take by mouth., Disp: , Rfl:  Multiple Vitamin (MULTIVITAMIN WITH MINERALS) TABS tablet, Take 1 tablet by mouth daily., Disp: , Rfl:    niacin 500 MG tablet, Take 500 mg by mouth at bedtime., Disp: , Rfl:    Omega-3 Fatty Acids (FISH OIL) 1000 MG CAPS, Take 1,000 mg by mouth daily., Disp: , Rfl:    pravastatin (PRAVACHOL) 40 MG tablet, Take 1 tablet (40 mg total) by mouth daily. (Patient not taking: Reported on 10/18/2021), Disp: 90 tablet, Rfl: 3   predniSONE (DELTASONE) 10 MG tablet, Take 6 tabs with breakfast Day 1, 5 tabs Day 2, 4 tabs Day 3, 3 tabs Day 4, 2  tabs Day 5, 1 tab Day 6., Disp: 21 tablet, Rfl: 0   Probiotic Product (PROBIOTIC DAILY PO), Take by mouth daily., Disp: , Rfl:    triamcinolone cream (KENALOG) 0.1 %, Apply 1 application topically daily as needed. (Patient not taking: Reported on 08/15/2021), Disp: , Rfl:    valsartan (DIOVAN) 160 MG tablet, Take 1 tablet (160 mg total) by mouth daily., Disp: 90 tablet, Rfl: 3   Past Medical History:  Diagnosis Date   Chicken pox    Colon polyps    H/O rubella    History of measles    Hyperlipidemia    Psoriasis      Assessment and Plan: 1. Cervicalgia   2. Chronic neck pain   3. Cervical radicular pain   4. DDD (degenerative disc disease), cervical   5. Cervical facet syndrome   6. Right arm pain   7. Cervical radiculitis   8. Chronic pain syndrome   9. Chronic, continuous use of opioids   Based on our discussion it is appropriate to refill his medications.  I have reviewed the [facility] practitioner database information and it is appropriate.  He continues to get good relief with the medicines and no side effects are reported.  He takes his medications as prescribed.  He is continuing to do his stretching strengthening exercises which I think is productive and otherwise seems to be tolerating the chronic neck pain well.  Will schedule him for return to clinic in 3 months and continue follow-up with his primary care physician for baseline medical care.  Follow Up Instructions:    I discussed the assessment and treatment plan with the patient. The patient was provided an opportunity to ask questions and all were answered. The patient agreed with the plan and demonstrated an understanding of the instructions.   The patient was advised to call back or seek an in-person evaluation if the symptoms worsen or if the condition fails to improve as anticipated.  I provided 30 minutes of non-face-to-face time during this encounter.   Yevette Edwards, MD

## 2022-08-02 ENCOUNTER — Other Ambulatory Visit: Payer: Self-pay | Admitting: Family Medicine

## 2022-08-02 DIAGNOSIS — I10 Essential (primary) hypertension: Secondary | ICD-10-CM

## 2022-08-02 NOTE — Telephone Encounter (Signed)
Requested medications are due for refill today.  yes  Requested medications are on the active medications list.  yes  Last refill. 08/09/2021 #90 3 rf  Future visit scheduled.   no  Notes to clinic.  Labs are expired.    Requested Prescriptions  Pending Prescriptions Disp Refills   valsartan (DIOVAN) 160 MG tablet [Pharmacy Med Name: VALSARTAN 160 MG TABLET] 90 tablet 3    Sig: TAKE 1 TABLET BY MOUTH EVERY DAY     Cardiovascular:  Angiotensin Receptor Blockers Failed - 08/02/2022  1:50 AM      Failed - Cr in normal range and within 180 days    Creat  Date Value Ref Range Status  11/09/2020 0.99 0.70 - 1.30 mg/dL Final         Failed - K in normal range and within 180 days    Potassium  Date Value Ref Range Status  11/09/2020 4.7 3.5 - 5.3 mmol/L Final         Failed - Last BP in normal range    BP Readings from Last 1 Encounters:  10/18/21 (!) 143/94         Failed - Valid encounter within last 6 months    Recent Outpatient Visits           6 months ago COVID-19 virus infection   [place] [facility] Smitty Cords, [place]   11 months ago Essential hypertension   [place] [facility] Smitty Cords, DO   1 year ago Essential hypertension   [place] [facility] Smitty Cords, DO   1 year ago Annual physical exam   [place] [facility] Smitty Cords, DO   2 years ago Appointment canceled by hospital   [facility] Smitty Cords, [place]              Passed - Patient is not pregnant

## 2022-08-08 ENCOUNTER — Other Ambulatory Visit
Admission: RE | Admit: 2022-08-08 | Discharge: 2022-08-08 | Disposition: A | Discharge status: Home / Self Care | Payer: No Typology Code available for payment source | Source: Ambulatory Visit | Attending: Anesthesiology | Admitting: Anesthesiology

## 2022-08-08 DIAGNOSIS — F1190 Opioid use, unspecified, uncomplicated: Secondary | ICD-10-CM | POA: Diagnosis not present

## 2022-08-08 DIAGNOSIS — G894 Chronic pain syndrome: Secondary | ICD-10-CM | POA: Diagnosis not present

## 2022-08-11 LAB — MISC LABCORP TEST (SEND OUT): Labcorp test code: 738526

## 2022-08-22 ENCOUNTER — Ambulatory Visit (INDEPENDENT_AMBULATORY_CARE_PROVIDER_SITE_OTHER): Payer: No Typology Code available for payment source | Admitting: Podiatry

## 2022-08-22 ENCOUNTER — Encounter: Payer: Self-pay | Admitting: Podiatry

## 2022-08-22 DIAGNOSIS — M722 Plantar fascial fibromatosis: Secondary | ICD-10-CM

## 2022-08-22 NOTE — Progress Notes (Signed)
Subjective:   Patient ID: Isaiah Johnson, male   DOB: 57 y.o.   MRN: [id number]   HPI Patient states he is improving still has discomfort but quite a bit better than previously   ROS      Objective:  Physical Exam  Neurovascular status intact pain in the plantar heel still present but better than previous with patient utilizing the boot which has been beneficial to condition.     Assessment:  Fasciitis condition improved with conservative care and immobilization     Plan:  Reviewed condition recommended continuation of stretching exercises anti-inflammatories ice and support.  If symptoms persist will consider other treatment options but at this point seems stable

## 2022-09-24 ENCOUNTER — Ambulatory Visit: Payer: No Typology Code available for payment source

## 2022-09-24 DIAGNOSIS — Z09 Encounter for follow-up examination after completed treatment for conditions other than malignant neoplasm: Secondary | ICD-10-CM

## 2022-09-24 DIAGNOSIS — Z86010 Personal history of colonic polyps: Secondary | ICD-10-CM

## 2022-09-24 DIAGNOSIS — K640 First degree hemorrhoids: Secondary | ICD-10-CM

## 2022-09-24 DIAGNOSIS — K6289 Other specified diseases of anus and rectum: Secondary | ICD-10-CM

## 2022-10-03 ENCOUNTER — Other Ambulatory Visit: Payer: Self-pay | Admitting: Family Medicine

## 2022-10-03 DIAGNOSIS — E7849 Other hyperlipidemia: Secondary | ICD-10-CM

## 2022-10-04 NOTE — Telephone Encounter (Signed)
Requested medication (s) are due for refill today: Yes  Requested medication (s) are on the active medication list: Yes  Last refill:  04/04/22  Future visit scheduled: No  Notes to clinic:  Protocol indicates lab work needed.    Requested Prescriptions  Pending Prescriptions Disp Refills   fenofibrate micronized (LOFIBRA) 67 MG capsule [Pharmacy Med Name: FENOFIBRATE 67 MG CAPSULE] 90 capsule 1    Sig: TAKE 1 CAPSULE (67 MG TOTAL) BY MOUTH DAILY BEFORE BREAKFAST.     Cardiovascular:  Antilipid - Fibric Acid Derivatives Failed - 10/03/2022 12:11 PM      Failed - ALT in normal range and within 360 days    ALT  Date Value Ref Range Status  11/09/2020 23 9 - 46 U/L Final         Failed - AST in normal range and within 360 days    AST  Date Value Ref Range Status  11/09/2020 17 10 - 35 U/L Final         Failed - Cr in normal range and within 360 days    Creat  Date Value Ref Range Status  11/09/2020 0.99 0.70 - 1.30 mg/dL Final         Failed - HGB in normal range and within 360 days    Hemoglobin  Date Value Ref Range Status  11/09/2020 15.8 13.2 - 17.1 g/dL Final         Failed - HCT in normal range and within 360 days    HCT  Date Value Ref Range Status  11/09/2020 49.6 38.5 - 50.0 % Final         Failed - PLT in normal range and within 360 days    Platelets  Date Value Ref Range Status  11/09/2020 276 140 - 400 Thousand/uL Final         Failed - WBC in normal range and within 360 days    WBC  Date Value Ref Range Status  11/09/2020 8.5 3.8 - 10.8 Thousand/uL Final         Failed - eGFR is 30 or above and within 360 days    GFR, Est African American  Date Value Ref Range Status  10/28/2019 119 > OR = 60 mL/min/1.88m2 Final   GFR, Est Non African American  Date Value Ref Range Status  10/28/2019 103 > OR = 60 mL/min/1.57m2 Final   eGFR  Date Value Ref Range Status  11/09/2020 91 > OR = 60 mL/min/1.46m2 Final    Comment:    The eGFR is based on  the CKD-EPI 2021 equation. To calculate  the new eGFR from a previous Creatinine or Cystatin C result, go to https://www.kidney.org/professionals/ kdoqi/gfr%5Fcalculator          Failed - Lipid Panel in normal range within the last 12 months    Cholesterol  Date Value Ref Range Status  11/09/2020 173 <200 mg/dL Final   LDL Cholesterol (Calc)  Date Value Ref Range Status  11/09/2020 102 (H) mg/dL (calc) Final    Comment:    Reference range: <100 . Desirable range <100 mg/dL for primary prevention;   <70 mg/dL for patients with CHD or diabetic patients  with > or = 2 CHD risk factors. Marland Kitchen LDL-C is now calculated using the Martin-Hopkins  calculation, which is a validated novel method providing  better accuracy than the Friedewald equation in the  estimation of LDL-C.  Horald Pollen et al. Lenox Ahr. 8657;846(96): 2061-2068  (http://education.Quest[employer].com/faq/FAQ164)  HDL  Date Value Ref Range Status  11/09/2020 48 > OR = 40 mg/dL Final   Triglycerides  Date Value Ref Range Status  11/09/2020 124 <150 mg/dL Final         Passed - Valid encounter within last 12 months    Recent Outpatient Visits           8 months ago COVID-19 virus infection   [place] [facility] [place], Netta Neat, DO   1 year ago Essential hypertension   [place] [facility] Smitty Cords, DO   1 year ago Essential hypertension   [place] [facility] Smitty Cords, DO   1 year ago Annual physical exam    [facility] Smit[facility], DO   2 years ago Appointment canceled by hospital   [facility] Smitty Cords, DO

## 2022-10-08 ENCOUNTER — Encounter: Payer: Self-pay | Admitting: Family Medicine

## 2022-11-02 ENCOUNTER — Other Ambulatory Visit: Payer: Self-pay | Admitting: Family Medicine

## 2022-11-02 DIAGNOSIS — I10 Essential (primary) hypertension: Secondary | ICD-10-CM

## 2022-11-02 NOTE — Telephone Encounter (Signed)
Requested medication (s) are due for refill today: Yes  Requested medication (s) are on the active medication list: Yes  Last refill:  08/02/22  Future visit scheduled: No  Notes to clinic:  Left message to make appointment. Protocol indicates lab work needed.    Requested Prescriptions  Pending Prescriptions Disp Refills   valsartan (DIOVAN) 160 MG tablet [Pharmacy Med Name: VALSARTAN 160 MG TABLET] 90 tablet 0    Sig: TAKE 1 TABLET BY MOUTH EVERY DAY     Cardiovascular:  Angiotensin Receptor Blockers Failed - 11/02/2022  2:34 AM      Failed - Cr in normal range and within 180 days    Creat  Date Value Ref Range Status  11/09/2020 0.99 0.70 - 1.30 mg/dL Final         Failed - K in normal range and within 180 days    Potassium  Date Value Ref Range Status  11/09/2020 4.7 3.5 - 5.3 mmol/L Final         Failed - Last BP in normal range    BP Readings from Last 1 Encounters:  10/18/21 (!) 143/94         Failed - Valid encounter within last 6 months    Recent Outpatient Visits           9 months ago COVID-19 virus infection   [place] [facility] [place], Netta Neat, DO   1 year ago Essential hypertension   [place] [facility] Smitty Cords, DO   1 year ago Essential hypertension   [place] [facility] Smitty Cords, DO   1 year ago Annual physical exam    [facility], DO   2 years ago Appointment canceled by hospital   [place] [facility] Smitty Cords, [place]              Passed - Patient is not pregnant

## 2022-11-12 ENCOUNTER — Other Ambulatory Visit: Payer: Self-pay | Admitting: Anesthesiology

## 2022-11-28 ENCOUNTER — Encounter: Payer: Self-pay | Admitting: Family Medicine

## 2022-11-28 ENCOUNTER — Ambulatory Visit: Payer: No Typology Code available for payment source | Admitting: Family Medicine

## 2022-11-28 DIAGNOSIS — R7309 Other abnormal glucose: Secondary | ICD-10-CM | POA: Diagnosis not present

## 2022-11-28 DIAGNOSIS — I10 Essential (primary) hypertension: Secondary | ICD-10-CM | POA: Diagnosis not present

## 2022-11-28 DIAGNOSIS — E7849 Other hyperlipidemia: Secondary | ICD-10-CM | POA: Diagnosis not present

## 2022-11-28 LAB — POCT GLYCOSYLATED HEMOGLOBIN (HGB A1C): Hemoglobin A1C: 5.8 % — AB (ref 4.0–5.6)

## 2022-11-28 MED ORDER — VALSARTAN 160 MG PO TABS
160.0000 mg | ORAL_TABLET | Freq: Every day | ORAL | 3 refills | Status: DC
Start: 2022-11-28 — End: 2023-06-13

## 2022-11-28 MED ORDER — FENOFIBRATE MICRONIZED 67 MG PO CAPS
67.0000 mg | ORAL_CAPSULE | Freq: Every day | ORAL | 3 refills | Status: DC
Start: 2022-11-28 — End: 2023-08-07

## 2022-11-28 MED ORDER — WEGOVY 0.5 MG/0.5ML ~~LOC~~ SOAJ
0.5000 mg | SUBCUTANEOUS | 0 refills | Status: DC
Start: 2022-11-28 — End: 2022-12-18

## 2022-11-28 NOTE — Patient Instructions (Addendum)
Thank you for coming to the office today.  Refilled meds.  For Weight Loss / Obesity only  Start Wegovy weekly injection, sample today, 4 pens New order 0.5mg  weekly, we will try to get it approved.  Use copay card from [facility] to reduce price once approved.  Give [place] 1 week to get the authorization, and contact back if questions  ------------------------------  Contrave - oral medication, appetite suppression has wellbutrin/bupropion and naltrexone in it and it can also help with appetite, it is ordered through a speciality pharmacy. - $99 per month  Free sample 7 day, 1 pill per day for 1 week  Alternative options  Semaglutide injection (mixed Ozempic) from [facility] 0.25mg  weekly for 4 weeks then increase to 0.5mg  weekly It comes in a vial and a needle syringe, you need to draw up the shot and self admin it weekly Cost is about $200 per month Call them to check pricing and availability  [name] Address: [address] [place], [place] [address] Phone: [phone]  --------------------------------  [facility] in [place], does not accept insurance. Call to schedule / Walk in to schedule They can do the weekly weight loss injections (same as Ozempic) Pay per visit and per shot. It may be approximately $60-75+ per visit/shot, approximately $200-300 range per month depending  [facility] Address: [address], [place], [place] [address] Phone: [phone]    DUE for FASTING BLOOD WORK (no food or drink after midnight before the lab appointment, only water or coffee without cream/sugar on the morning of)  SCHEDULE "Lab Only" visit in the morning at the clinic for lab draw in 3 MONTHS   - Make sure Lab Only appointment is at about 1 week before your next appointment, so that results will be available  For Lab Results, once available within 2-3 days of blood draw, you can can log in to MyChart online to view your results and a  brief explanation. Also, we can discuss results at next follow-up visit.   Please schedule a Follow-up Appointment to: Return in about 3 months (around 02/28/2023) for 3 month fasting lab only then 1 day later Annual Physical (prefer visit on a Wednesday).  If you have any other questions or concerns, please feel free to call the office or send a message through MyChart. You may also schedule an earlier appointment if necessary.  Additionally, you may be receiving a survey about your experience at our office within a few days to 1 week by e-mail or mail. We value your feedback.  Saralyn Pilar, DO [facility], [place]

## 2022-11-28 NOTE — Progress Notes (Unsigned)
Subjective:    Patient ID: Isaiah Johnson, male    DOB: 09-04-1965, 57 y.o.   MRN: [id number]  Isaiah Johnson is a 57 y.o. male presenting on 11/28/2022 for Obesity (/)   HPI  Morbid Obesity BMI >38 Hypertension Last visit 03/15/22 new dx HTN and started on Amlodipine 10mg  daily. He did develop side effect with swelling in bilateral lower extremities ankles and feet. No other areas of swelling.   Improved BP control on ARB   Home Readings improved overall   Med: Valsartan 160mg  daily Lifestyle: Drinks coffee, soda Admits occasional sweats / heat intolerance Denies CP, dyspnea, HA, edema, dizziness / lightheadedness   Hyperlipidemia Off Pravastatin 40mg  since 2023 due to possible myalgia side effect      11/28/2022    1:50 PM 10/18/2021    9:17 AM 02/27/2021    1:03 PM  Depression screen PHQ 2/9  Decreased Interest 0 0 0  Down, Depressed, Hopeless 0 0 0  PHQ - 2 Score 0 0 0  Altered sleeping 0    Tired, decreased energy 0    Change in appetite 0    Feeling bad or failure about yourself  0    Trouble concentrating 0    Moving slowly or fidgety/restless 0    Suicidal thoughts 0    PHQ-9 Score 0    Difficult doing work/chores Not difficult at all      Social History   Tobacco Use   Smoking status: Never   Smokeless tobacco: Never  Substance Use Topics   Alcohol use: Yes    Comment: 1 drink   Drug use: No    Review of Systems Per HPI unless specifically indicated above     Objective:    BP 108/80 (BP Location: Left Arm, Patient Position: Sitting, Cuff Size: Large)   Pulse 90   Temp (!) 96.8 F (36 C) (Temporal)   Ht 5\' 8"  (1.727 m)   Wt 253 lb (114.8 kg)   SpO2 95%   BMI 38.47 kg/m   Wt Readings from Last 3 Encounters:  11/28/22 253 lb (114.8 kg)  01/16/22 240 lb (108.9 kg)  10/18/21 240 lb (108.9 kg)    Physical Exam Vitals and nursing note reviewed.  Constitutional:      General: He is not in acute distress.    Appearance: He is  well-developed. He is obese. He is not diaphoretic.     Comments: Well-appearing, comfortable, cooperative  HENT:     Head: Normocephalic and atraumatic.  Eyes:     General:        Right eye: No discharge.        Left eye: No discharge.     Conjunctiva/sclera: Conjunctivae normal.  Neck:     Thyroid: No thyromegaly.  Cardiovascular:     Rate and Rhythm: Normal rate and regular rhythm.     Pulses: Normal pulses.     Heart sounds: Normal heart sounds. No murmur heard. Pulmonary:     Effort: Pulmonary effort is normal. No respiratory distress.     Breath sounds: Normal breath sounds. No wheezing or rales.  Musculoskeletal:        General: Normal range of motion.     Cervical back: Normal range of motion and neck supple.  Lymphadenopathy:     Cervical: No cervical adenopathy.  Skin:    General: Skin is warm and dry.     Findings: No erythema or rash.  Neurological:     Mental  Status: He is alert and oriented to person, place, and time. Mental status is at baseline.  Psychiatric:        Behavior: Behavior normal.     Comments: Well groomed, good eye contact, normal speech and thoughts    Results for orders placed or performed in visit on 11/28/22  POCT glycosylated hemoglobin (Hb A1C)  Result Value Ref Range   Hemoglobin A1C 5.8 (A) 4.0 - 5.6 %      Assessment & Plan:   Problem List Items Addressed This Visit     Essential hypertension   Relevant Medications   fenofibrate micronized (LOFIBRA) 67 MG capsule   valsartan (DIOVAN) 160 MG tablet   Hyperlipidemia   Relevant Medications   fenofibrate micronized (LOFIBRA) 67 MG capsule   valsartan (DIOVAN) 160 MG tablet   Morbid obesity (HCC) - Primary   Relevant Medications   WEGOVY 0.5 MG/0.5ML SOAJ   Other Visit Diagnoses     Elevated hemoglobin A1c       Relevant Orders   POCT glycosylated hemoglobin (Hb A1C) (Completed)       Refilled meds.  For Weight Loss / Obesity only  Start Wegovy weekly injection,  sample today, 4 pens New order 0.5mg  weekly, we will try to get it approved.  Use copay card from [facility] to reduce price once approved.  Give [place] 1 week to get the authorization, and contact back if questions  Meds ordered this encounter  Medications   fenofibrate micronized (LOFIBRA) 67 MG capsule    Sig: Take 1 capsule (67 mg total) by mouth daily before breakfast.    Dispense:  90 capsule    Refill:  3   valsartan (DIOVAN) 160 MG tablet    Sig: Take 1 tablet (160 mg total) by mouth daily.    Dispense:  90 tablet    Refill:  3   WEGOVY 0.5 MG/0.5ML SOAJ    Sig: Inject 0.5 mg into the skin once a week.    Dispense:  2 mL    Refill:  0      Follow up plan: Return in about 3 months (around 02/28/2023) for 3 month fasting lab only then 1 day later Annual Physical (prefer visit on a Wednesday).   Saralyn Pilar, DO [facility] 11/28/2022, 1:52 PM

## 2022-11-29 ENCOUNTER — Other Ambulatory Visit: Payer: Self-pay | Admitting: Family Medicine

## 2022-11-29 ENCOUNTER — Encounter: Payer: Self-pay | Admitting: Family Medicine

## 2022-11-29 DIAGNOSIS — R7309 Other abnormal glucose: Secondary | ICD-10-CM

## 2022-11-29 DIAGNOSIS — Z125 Encounter for screening for malignant neoplasm of prostate: Secondary | ICD-10-CM

## 2022-11-29 DIAGNOSIS — Z0000 Encounter for general adult medical examination without abnormal findings: Secondary | ICD-10-CM

## 2022-11-29 DIAGNOSIS — E7849 Other hyperlipidemia: Secondary | ICD-10-CM

## 2022-11-29 DIAGNOSIS — I10 Essential (primary) hypertension: Secondary | ICD-10-CM

## 2022-12-17 ENCOUNTER — Other Ambulatory Visit: Payer: Self-pay | Admitting: Anesthesiology

## 2022-12-18 ENCOUNTER — Encounter: Payer: Self-pay | Admitting: Family Medicine

## 2022-12-19 ENCOUNTER — Other Ambulatory Visit: Payer: Self-pay | Admitting: Anesthesiology

## 2023-01-15 ENCOUNTER — Other Ambulatory Visit: Payer: Self-pay | Admitting: Family Medicine

## 2023-01-15 NOTE — Telephone Encounter (Signed)
Requested medication (s) are due for refill today: Yes  Requested medication (s) are on the active medication list: Yes  Last refill:  12/18/22  Future visit scheduled: No  Notes to clinic:  See request.Last refill did not specify amount and number of refills.    Requested Prescriptions  Pending Prescriptions Disp Refills   Semaglutide,0.25 or 0.5MG /DOS, 2 MG/1.5ML SOPN [Pharmacy Med Name: SEMAGLUTIDE1MG /MLINJ(5)] 5 mL     Sig: INJECT 50 UNITS (0.5MG ) SUBCUTANEOUSLY ONCE A WEEK FOR 4 WEEKS     Endocrinology:  Diabetes - GLP-1 Receptor Agonists - semaglutide Failed - 01/15/2023 11:12 AM      Failed - HBA1C in normal range and within 180 days    Hemoglobin A1C  Date Value Ref Range Status  11/28/2022 5.8 (A) 4.0 - 5.6 % Final   Hgb A1c MFr Bld  Date Value Ref Range Status  11/09/2020 5.6 <5.7 % of total Hgb Final    Comment:    For the purpose of screening for the presence of diabetes: . <5.7%       Consistent with the absence of diabetes 5.7-6.4%    Consistent with increased risk for diabetes             (prediabetes) > or =6.5%  Consistent with diabetes . This assay result is consistent with a decreased risk of diabetes. . Currently, no consensus exists regarding use of hemoglobin A1c for diagnosis of diabetes in children. . According to American Diabetes Association (ADA) guidelines, hemoglobin A1c <7.0% represents optimal control in non-pregnant diabetic patients. Different metrics may apply to specific patient populations.  Standards of Medical Care in Diabetes(ADA). .          Failed - Cr in normal range and within 360 days    Creat  Date Value Ref Range Status  11/09/2020 0.99 0.70 - 1.30 mg/dL Final         Passed - Valid encounter within last 6 months    Recent Outpatient Visits           1 month ago Morbid obesity [facility])   [facility] Smitty Cords, DO   12 months ago COVID-19 virus infection   [place]  [facility] [place], Netta Neat, DO   1 year ago Essential hypertension   [place] [facility] Smitty Cords, DO   1 year ago Essential hypertension   [place] [facility] Smitty Cords, DO   2 years ago Annual physical exam   [name] Smitty Cords, DO       Future Appointments             In 2 days Pernell Dupre, Currie [place], MD [facility]

## 2023-01-17 ENCOUNTER — Ambulatory Visit: Payer: No Typology Code available for payment source | Attending: Anesthesiology | Admitting: Anesthesiology

## 2023-01-17 DIAGNOSIS — M79601 Pain in right arm: Secondary | ICD-10-CM | POA: Diagnosis not present

## 2023-01-17 DIAGNOSIS — M5030 Other cervical disc degeneration, unspecified cervical region: Secondary | ICD-10-CM

## 2023-01-17 DIAGNOSIS — Z79891 Long term (current) use of opiate analgesic: Secondary | ICD-10-CM

## 2023-01-17 DIAGNOSIS — G8929 Other chronic pain: Secondary | ICD-10-CM

## 2023-01-17 DIAGNOSIS — M5412 Radiculopathy, cervical region: Secondary | ICD-10-CM

## 2023-01-17 DIAGNOSIS — F1190 Opioid use, unspecified, uncomplicated: Secondary | ICD-10-CM

## 2023-01-17 DIAGNOSIS — M542 Cervicalgia: Secondary | ICD-10-CM

## 2023-01-17 DIAGNOSIS — G894 Chronic pain syndrome: Secondary | ICD-10-CM

## 2023-01-17 DIAGNOSIS — M47812 Spondylosis without myelopathy or radiculopathy, cervical region: Secondary | ICD-10-CM | POA: Diagnosis not present

## 2023-01-17 MED ORDER — CYCLOBENZAPRINE HCL 10 MG PO TABS: 10.0000 mg | ORAL_TABLET | Freq: Two times a day (BID) | ORAL | 5 refills | Status: DC

## 2023-01-17 MED ORDER — TRAMADOL HCL 50 MG PO TABS: 50.0000 mg | ORAL_TABLET | Freq: Three times a day (TID) | ORAL | 2 refills | Status: DC

## 2023-01-17 MED ORDER — GABAPENTIN 300 MG PO CAPS: 300.0000 mg | ORAL_CAPSULE | Freq: Four times a day (QID) | ORAL | 2 refills | Status: DC

## 2023-01-18 NOTE — Progress Notes (Signed)
Virtual Visit via Telephone Note  I connected with [name] on 01/18/23 at  8:45 AM EDT by telephone and verified that I am speaking with the correct person using two identifiers.  Location: Patient: Home Provider: Pain control center   I discussed the limitations, risks, security and privacy concerns of performing an evaluation and management service by telephone and the availability of in person appointments. I also discussed with the patient that there may be a patient responsible charge related to this service. The patient expressed understanding and agreed to proceed.   History of Present Illness: I spoke with Isaiah Johnson for his video conference.  We were unable link for the video portion of this and do this via telephone.  He states that his cervical neck pain and shoulder pain are stable in nature with no recent changes.  He continues to take his tramadol and this continues to provide effective pain relief for him.  He generally takes 1 tablet in the morning 1 in the afternoon and 2 tablets at bedtime.  He is also still taking his gabapentin and this combination is working effectively to keep the neuralgic character of the arm pain and spasming neck pain under control.  He is trying to do his stretching strengthening exercises as best he can.  He is avoiding certain activities that he knows causes problems for him.  Otherwise he is in his usual state of health with no new difficulties with upper extremity numbness or weakness.  No problems with bowel or bladder function.  No side effects with the medication.  Review of systems: General: No fevers or chills Pulmonary: No shortness of breath or dyspnea Cardiac: No angina or palpitations or lightheadedness GI: No abdominal pain or constipation Psych: No depression    Observations/Objective:  Current Outpatient Medications:    acetaminophen (TYLENOL) 500 MG tablet, Take 500 mg by mouth every 6 (six) hours as needed., Disp: , Rfl:     aspirin EC 81 MG tablet, Take 81 mg by mouth daily., Disp: , Rfl:    b complex vitamins capsule, Take 1 capsule by mouth daily., Disp: , Rfl:    cyclobenzaprine (FLEXERIL) 10 MG tablet, Take 1 tablet (10 mg total) by mouth 2 (two) times daily., Disp: 60 tablet, Rfl: 5   fenofibrate micronized (LOFIBRA) 67 MG capsule, Take 1 capsule (67 mg total) by mouth daily before breakfast., Disp: 90 capsule, Rfl: 3   gabapentin (NEURONTIN) 300 MG capsule, Take 1 capsule (300 mg total) by mouth 4 (four) times daily., Disp: 120 capsule, Rfl: 2   Melatonin 200 MCG TABS, Take by mouth., Disp: , Rfl:    meloxicam (MOBIC) 7.5 MG tablet, TAKE 1 TABLET BY MOUTH 2 TIMES DAILY AS NEEDED FOR PAIN., Disp: 60 tablet, Rfl: 2   Multiple Vitamin (MULTIVITAMIN WITH MINERALS) TABS tablet, Take 1 tablet by mouth daily., Disp: , Rfl:    niacin 500 MG tablet, Take 500 mg by mouth at bedtime., Disp: , Rfl:    Omega-3 Fatty Acids (FISH OIL) 1000 MG CAPS, Take 1,000 mg by mouth daily., Disp: , Rfl:    Probiotic Product (PROBIOTIC DAILY PO), Take by mouth daily., Disp: , Rfl:    Semaglutide,0.25 or 0.5MG /DOS, 2 MG/1.5ML SOPN, Inject 0.5 mg into the skin once a week., Disp: , Rfl:    traMADol (ULTRAM) 50 MG tablet, Take 1 tablet (50 mg total) by mouth in the morning, at noon, and at bedtime., Disp: 180 tablet, Rfl: 2   valsartan (DIOVAN)  160 MG tablet, Take 1 tablet (160 mg total) by mouth daily., Disp: 90 tablet, Rfl: 3  Past Medical History:  Diagnosis Date   Chicken pox    Colon polyps    H/O rubella    History of measles    Hyperlipidemia    Psoriasis      Assessment and Plan: 1. Cervicalgia   2. Chronic neck pain   3. Cervical radicular pain   4. DDD (degenerative disc disease), cervical   5. Cervical facet syndrome   6. Right arm pain   7. Cervical radiculitis   8. Chronic pain syndrome   9. Chronic, continuous use of opioids    Based on our conversation I think it is appropriate to refill his medicines the  next several months.  He is doing quite well with the tramadol and this is keeping his pain under good control whereas he has failed more conservative medication management.  He has not a candidate for interventional therapy.  I have encouraged him to continue with his core stretching exercises and we have talked about some activities that can help with maintaining muscle tone bulk and strength in the upper extremities and certain risk avoidance activities as well.  Continue efforts with TENS unit application massage and current medication management with schedule return in 3 months.  Continue follow-up with his primary care physicians for baseline medical care.  Follow Up Instructions:    I discussed the assessment and treatment plan with the patient. The patient was provided an opportunity to ask questions and all were answered. The patient agreed with the plan and demonstrated an understanding of the instructions.   The patient was advised to call back or seek an in-person evaluation if the symptoms worsen or if the condition fails to improve as anticipated.  I provided 30 minutes of non-face-to-face time during this encounter.   Yevette Edwards, MD

## 2023-01-21 NOTE — Addendum Note (Signed)
Addended by: Smitty Cords on: 01/21/2023 07:09 PM   Modules accepted: Orders

## 2023-01-24 ENCOUNTER — Encounter: Payer: Self-pay | Admitting: Podiatry

## 2023-01-24 ENCOUNTER — Ambulatory Visit (INDEPENDENT_AMBULATORY_CARE_PROVIDER_SITE_OTHER): Payer: No Typology Code available for payment source

## 2023-01-24 ENCOUNTER — Ambulatory Visit: Payer: No Typology Code available for payment source | Admitting: Podiatry

## 2023-01-24 DIAGNOSIS — M722 Plantar fascial fibromatosis: Secondary | ICD-10-CM | POA: Diagnosis not present

## 2023-01-24 DIAGNOSIS — M778 Other enthesopathies, not elsewhere classified: Secondary | ICD-10-CM

## 2023-01-24 MED ORDER — TRIAMCINOLONE ACETONIDE 10 MG/ML IJ SUSP
10.0000 mg | Freq: Once | INTRAMUSCULAR | Status: AC
Start: 2023-01-24 — End: 2023-01-24
  Administered 2023-01-24: 10 mg via INTRA_ARTICULAR

## 2023-01-24 NOTE — Progress Notes (Signed)
Subjective:   Patient ID: Isaiah Johnson, male   DOB: 57 y.o.   MRN: [id number]   HPI patient presents stating that his heel continues to bother him in the mid arch and it is very sore with walking    ROS      Objective:  Physical Exam  Neurovascular status intact inflammation mid arch area right with patient not able to stretch this properly at the current time     Assessment:  Chronic Planter fasciitis mid arch area right with fluid buildup     Plan:  H&P reviewed I went ahead today did sterile prep I injected the mid arch 3 mg Kenalog 5 mg Xylocaine I then dispensed night splint that I want him to use at work when sitting and at home with heat ice therapy that was properly fitted into his arch and prefab that is custom designed to lift and support his arch and stretch

## 2023-01-30 ENCOUNTER — Ambulatory Visit: Payer: No Typology Code available for payment source | Admitting: Podiatry

## 2023-01-30 ENCOUNTER — Ambulatory Visit: Payer: Self-pay

## 2023-01-30 NOTE — Telephone Encounter (Signed)
Chief Complaint: Arm pain Symptoms: right forearm pain, radiating down to the wrist and hand, weakness Frequency: Constant Pertinent Negatives: Patient denies swelling, fever,  Disposition: [] ED /[] Urgent Care (no appt availability in office) / [x] Appointment(In office/virtual)/ []  [facility]/ [] Home Care/ [] Refused Recommended Disposition /[] [facility]/ []  Follow-up with PCP Additional Notes: Patient states he did a sudden movement of the right arm 2 weeks ago and begin to feel pain in the right forearm down to wrist and hand. Patient reports pain is currently a 7/10 but at times can be a 10/10 on the pain scale. Patient states tylenol and ibuprofen is not helpful with the pain. Patient reports the hand and arm is very weak as well. Care advice given and patient was offered an appointment this week. Patient declined the appointment due to his work schedule. Patient requested an early morning appointment on 10/30. Patient was scheduled 02/06/23 at 0800. Patient stated if someone cancels an early morning appointment he would be able to come in sooner.  Summary: Right arm pain (( 2wks)   Right arm pain, radiating down to wrist and hand  (two weeks)     Reason for Disposition  [1] MODERATE pain (e.g., interferes with normal activities) AND [2] present > 3 days  Answer Assessment - Initial Assessment Questions 1. ONSET: "When did the pain start?"     2 weeks ago 2. LOCATION: "Where is the pain located?"     Right arm  3. PAIN: "How bad is the pain?" (Scale 1-10; or mild, moderate, severe)   - MILD (1-3): Doesn't interfere with normal activities.   - MODERATE (4-7): Interferes with normal activities (e.g., work or school) or awakens from sleep.   - SEVERE (8-10): Excruciating pain, unable to do any normal activities, unable to hold a cup of water.     7/10 4. WORK OR EXERCISE: "Has there been any recent work or exercise that involved this part of the body?"     Yes, I  feel like I pulled a muscle  5. CAUSE: "What do you think is causing the arm pain?"     Sudden movement of the arm  6. OTHER SYMPTOMS: "Do you have any other symptoms?" (e.g., neck pain, swelling, rash, fever, numbness, weakness)     Radiating down to my wrist and hand, weakness  Protocols used: Arm Pain-A-AH

## 2023-02-06 ENCOUNTER — Ambulatory Visit: Payer: No Typology Code available for payment source | Admitting: Family Medicine

## 2023-02-20 ENCOUNTER — Other Ambulatory Visit: Payer: No Typology Code available for payment source

## 2023-02-20 ENCOUNTER — Ambulatory Visit (INDEPENDENT_AMBULATORY_CARE_PROVIDER_SITE_OTHER): Payer: No Typology Code available for payment source | Admitting: Podiatry

## 2023-02-20 ENCOUNTER — Encounter: Payer: Self-pay | Admitting: Podiatry

## 2023-02-20 DIAGNOSIS — M722 Plantar fascial fibromatosis: Secondary | ICD-10-CM

## 2023-02-20 MED ORDER — TRIAMCINOLONE ACETONIDE 10 MG/ML IJ SUSP
10.0000 mg | Freq: Once | INTRAMUSCULAR | Status: AC
Start: 2023-02-20 — End: 2023-02-20
  Administered 2023-02-20: 10 mg via INTRA_ARTICULAR

## 2023-02-20 NOTE — Progress Notes (Signed)
Subjective:   Patient ID: Isaiah Johnson, male   DOB: 57 y.o.   MRN: [id number]   HPI Patient states he is quite a bit better states the right arch is improving still painful but the left one has been bothering him more recently as he has been offloading his weightbearing pattern   ROS      Objective:  Physical Exam  There are status intact with discomfort still noted underneath the right arch moderate in intensity but the left is quite sore in the mid arch area     Assessment:  Acute plantar fasciitis which has migrated more to the left from the right currently     Plan:  H&P reviewed and I have recommended that he be aggressive with the night splint along with ice therapy and dispensed ice packages Ace wrap.  I then did fascial injection left 3 mg Kenalog 5 mg Xylocaine and he will be seen back as needed

## 2023-02-25 ENCOUNTER — Other Ambulatory Visit: Payer: Self-pay | Admitting: Anesthesiology

## 2023-04-17 ENCOUNTER — Other Ambulatory Visit: Payer: Self-pay | Admitting: Anesthesiology

## 2023-04-19 LAB — TOXASSURE SELECT 13 (MW), URINE

## 2023-04-22 ENCOUNTER — Telehealth: Payer: Self-pay

## 2023-04-22 NOTE — Telephone Encounter (Signed)
 Copied from CRM [id number]. Topic: General - Other >> Apr 22, 2023 11:33 AM Yvone Marda Blow wrote: Pt Isaiah Johnson called and stated his new pharmacy is [facility]. It has been added to his chart and this is the prescription card number    [facility]  Prescription ID Card  RX BIN [id number] PCN: [id number]  RX GROUPBETHA DAVIE SPRAWLS: ([id number])  [id number] MEMBER ID: [id number] Name:  Isaiah Johnson

## 2023-04-24 ENCOUNTER — Telehealth: Payer: Self-pay | Admitting: Anesthesiology

## 2023-04-24 NOTE — Telephone Encounter (Signed)
 Patient needs to change his pharmacy to  [facility] due to change in [employer]   [phone] Rx Vernard Goldberg [id number] Rx PCN [id number] Rx Group Flow [id number]    He is asking to have his current scripts sent in to this pharmacy. Please advise patient

## 2023-04-24 NOTE — Telephone Encounter (Signed)
 No, but I will change it now. Thank you for the reminder.

## 2023-04-24 NOTE — Telephone Encounter (Signed)
 Patient needs appointment for any med refill changes (including pahrmacies). He was due to come back in 3 months. Last appointment was in October. When he comes for next visit we can change pharmacies. Please call patient and inform.

## 2023-04-29 ENCOUNTER — Telehealth: Payer: Self-pay | Admitting: Family Medicine

## 2023-04-29 NOTE — Telephone Encounter (Signed)
Would you be able to call patient to confirm this refill request?  It says Semaglutide dose increase.  The rx list shows currently on Semaglutide 1mg  weekly inj  However it is from [facility]) not [employer].  Can you confirm if he wants [place] to send a Semaglutide 2mg  order to [facility]?  We cannot order compounded medicines to [facility].  Saralyn Pilar, DO [facility] 04/29/2023, 3:04 PM

## 2023-04-29 NOTE — Telephone Encounter (Signed)
Okay. Dose inc to 2mg  order will be faxed to [name]  Saralyn Pilar, DO [facility] 04/29/2023, 3:53 PM

## 2023-04-29 NOTE — Telephone Encounter (Signed)
Please notify patient that Semaglutide 1mg  weekly will be sent by fax to [facility] by end of day or tomorrow Tues 1/21. They should contact him when ready for pick up.  Unfortunately, I cannot send this particular order to [facility] because he is reading Ozempic / Semaglutide is low cost, but that is only for Type 2 Diabetic patients. He is not diabetic, so insurance does not cover this from a Rx to pharmacy. It has to be done by out of pocket to compounding pharmacy.  The only options for insurance coverage for non diabetic patients are:  [employer]  Zepbound.  I believe he checked into these already before. But he can look into it again for future.  [employer] TO Check Cost & Coverage of Zepbound Please contact Research officer, trade union (manufacturer for [employer]) 1-800-LillyRx [phone]) - Live agent to discuss cost and coverage.  Saralyn Pilar, DO [facility] 04/29/2023, 3:22 PM

## 2023-04-29 NOTE — Telephone Encounter (Signed)
Medication Refill -  Most Recent Primary Care Visit:  Provider: Smitty Cords  Department: [facility]  Visit Type: OFFICE VISIT  Date: 11/28/2022  Medication:  Semaglutide,0.25 or 0.5MG /DOS, 2 MG/1.5ML SOPN  AND a dose increase  Has the patient contacted their pharmacy? No This is a new pharmacy for this med Is this the correct pharmacy for this prescription? No If no, delete pharmacy and type the correct one.  This is the patient's preferred pharmacy:   [facility] - [place], [place] - [address] [place] [address] Phone: [phone] Fax: [phone]   Has the prescription been filled recently? No  Is the patient out of the medication? Yes  Has the patient been seen for an appointment in the last year OR does the patient have an upcoming appointment? Yes  Can we respond through MyChart? Yes  Agent: Please be advised that Rx refills may take up to 3 business days. We ask that you follow-up with your pharmacy.

## 2023-04-30 ENCOUNTER — Other Ambulatory Visit: Payer: Self-pay | Admitting: Family Medicine

## 2023-04-30 NOTE — Telephone Encounter (Signed)
Requested medication (s) are due for refill today: no  Requested medication (s) are on the active medication list: yes  Last refill:  12/18/22  Future visit scheduled: yes  Notes to clinic:  Unable to refill per protocol due to failed labs, no updated results.      Requested Prescriptions  Pending Prescriptions Disp Refills   Semaglutide,0.25 or 0.5MG /DOS, 2 MG/1.5ML SOPN [Pharmacy Med Name: SEMAGLUTIDE1MG /MLINJ(5)] 1.5 mL 2    Sig: INJECT 100 UNITS (1.0MG ) SUBCUTANEOUSLY ONCE WEEKLY FOR FOUR (4) WEEKS     Endocrinology:  Diabetes - GLP-1 Receptor Agonists - semaglutide Failed - 04/30/2023  2:23 PM      Failed - HBA1C in normal range and within 180 days    Hemoglobin A1C  Date Value Ref Range Status  11/28/2022 5.8 (A) 4.0 - 5.6 % Final   Hgb A1c MFr Bld  Date Value Ref Range Status  11/09/2020 5.6 <5.7 % of total Hgb Final    Comment:    For the purpose of screening for the presence of diabetes: . <5.7%       Consistent with the absence of diabetes 5.7-6.4%    Consistent with increased risk for diabetes             (prediabetes) > or =6.5%  Consistent with diabetes . This assay result is consistent with a decreased risk of diabetes. . Currently, no consensus exists regarding use of hemoglobin A1c for diagnosis of diabetes in children. . According to American Diabetes Association (ADA) guidelines, hemoglobin A1c <7.0% represents optimal control in non-pregnant diabetic patients. Different metrics may apply to specific patient populations.  Standards of Medical Care in Diabetes(ADA). .          Failed - Cr in normal range and within 360 days    Creat  Date Value Ref Range Status  11/09/2020 0.99 0.70 - 1.30 mg/dL Final         Passed - Valid encounter within last 6 months    Recent Outpatient Visits           5 months ago Morbid obesity [facility])   [place] [facility] Smitty Cords, DO   1 year ago COVID-19 virus infection    [place] [facility] [place], Netta Neat, DO   1 year ago Essential hypertension   [place] [facility] Smitty Cords, DO   2 years ago Essential hypertension   [place] [facility] Smitty Cords, DO   2 years ago Annual physical exam   [place] [facility] [place], Netta Neat, [place]

## 2023-05-08 ENCOUNTER — Encounter: Payer: Self-pay | Admitting: Anesthesiology

## 2023-05-08 ENCOUNTER — Ambulatory Visit: Payer: Managed Care, Other (non HMO) | Attending: Anesthesiology | Admitting: Anesthesiology

## 2023-05-08 DIAGNOSIS — M5030 Other cervical disc degeneration, unspecified cervical region: Secondary | ICD-10-CM

## 2023-05-08 DIAGNOSIS — M4722 Other spondylosis with radiculopathy, cervical region: Secondary | ICD-10-CM | POA: Diagnosis not present

## 2023-05-08 DIAGNOSIS — M79601 Pain in right arm: Secondary | ICD-10-CM | POA: Diagnosis not present

## 2023-05-08 DIAGNOSIS — M5412 Radiculopathy, cervical region: Secondary | ICD-10-CM

## 2023-05-08 DIAGNOSIS — M47812 Spondylosis without myelopathy or radiculopathy, cervical region: Secondary | ICD-10-CM

## 2023-05-08 DIAGNOSIS — G8929 Other chronic pain: Secondary | ICD-10-CM

## 2023-05-08 DIAGNOSIS — Z79891 Long term (current) use of opiate analgesic: Secondary | ICD-10-CM

## 2023-05-08 DIAGNOSIS — G894 Chronic pain syndrome: Secondary | ICD-10-CM | POA: Diagnosis not present

## 2023-05-08 DIAGNOSIS — M542 Cervicalgia: Secondary | ICD-10-CM

## 2023-05-08 DIAGNOSIS — F1190 Opioid use, unspecified, uncomplicated: Secondary | ICD-10-CM

## 2023-05-08 MED ORDER — CYCLOBENZAPRINE HCL 10 MG PO TABS: 10.0000 mg | ORAL_TABLET | Freq: Two times a day (BID) | ORAL | 5 refills | Status: DC

## 2023-05-08 MED ORDER — TRAMADOL HCL 50 MG PO TABS: 100.0000 mg | ORAL_TABLET | Freq: Three times a day (TID) | ORAL | 2 refills | Status: DC

## 2023-05-08 MED ORDER — GABAPENTIN 300 MG PO CAPS: 300.0000 mg | ORAL_CAPSULE | Freq: Four times a day (QID) | ORAL | 2 refills | Status: DC

## 2023-05-08 NOTE — Patient Instructions (Signed)

## 2023-05-08 NOTE — Progress Notes (Signed)
Virtual Visit via Telephone Note  I connected with Isaiah Johnson on 05/08/23 at  1:00 PM EST by telephone and verified that I am speaking with the correct person using two identifiers.  Location: Patient: Home Provider: Pain control center   I discussed the limitations, risks, security and privacy concerns of performing an evaluation and management service by telephone and the availability of in person appointments. I also discussed with the patient that there may be a patient responsible charge related to this service. The patient expressed understanding and agreed to proceed.   History of Present Illness: I spoke with Isaiah Johnson via telephone as we could not link for the video portion of the conference.  He reports that his neck pain is persisting but stable in nature.  The severity is generally related to how active he is.  He still getting some right shoulder and right posterior right arm pain.  Occasionally this radiates into the little and ring finger on the right arm.  He does have diminished overall grip strength compared to the left side but it has been stable since surgery with no worsening.  The pain is a chronic aching dull pain that is paroxysmal in nature.  He generally takes his gabapentin once in the morning afternoon and then 2 at bedtime in addition to tramadol 2 tablets 3 times a day on average.  The medications enable him to stay active functional and keep the pain under control.  Otherwise he is in his usual state of health at this time.  No side effects with the medications are reported. Review of systems: General: No fevers or chills Pulmonary: No shortness of breath or dyspnea Cardiac: No angina or palpitations or lightheadedness GI: No abdominal pain or constipation Psych: No depression     Observations/Objective:  Current Outpatient Medications:    acetaminophen (TYLENOL) 500 MG tablet, Take 500 mg by mouth every 6 (six) hours as needed., Disp: , Rfl:     aspirin EC 81 MG tablet, Take 81 mg by mouth daily., Disp: , Rfl:    b complex vitamins capsule, Take 1 capsule by mouth daily., Disp: , Rfl:    cyclobenzaprine (FLEXERIL) 10 MG tablet, Take 1 tablet (10 mg total) by mouth 2 (two) times daily., Disp: 60 tablet, Rfl: 5   fenofibrate micronized (LOFIBRA) 67 MG capsule, Take 1 capsule (67 mg total) by mouth daily before breakfast., Disp: 90 capsule, Rfl: 3   gabapentin (NEURONTIN) 300 MG capsule, Take 1 capsule (300 mg total) by mouth 4 (four) times daily., Disp: 120 capsule, Rfl: 2   Melatonin 200 MCG TABS, Take by mouth., Disp: , Rfl:    meloxicam (MOBIC) 7.5 MG tablet, TAKE 1 TABLET BY MOUTH 2 TIMES DAILY AS NEEDED FOR PAIN., Disp: 60 tablet, Rfl: 2   Multiple Vitamin (MULTIVITAMIN WITH MINERALS) TABS tablet, Take 1 tablet by mouth daily., Disp: , Rfl:    niacin 500 MG tablet, Take 500 mg by mouth at bedtime., Disp: , Rfl:    Omega-3 Fatty Acids (FISH OIL) 1000 MG CAPS, Take 1,000 mg by mouth daily., Disp: , Rfl:    Probiotic Product (PROBIOTIC DAILY PO), Take by mouth daily., Disp: , Rfl:    Semaglutide,0.25 or 0.5MG /DOS, 2 MG/1.5ML SOPN, Inject 1 mg into the skin once a week. Fax order to [facility] compounded, Disp: , Rfl:    traMADol (ULTRAM) 50 MG tablet, Take 2 tablets (100 mg total) by mouth in the morning, at noon, and at bedtime., Disp: 180  tablet, Rfl: 2   valsartan (DIOVAN) 160 MG tablet, Take 1 tablet (160 mg total) by mouth daily., Disp: 90 tablet, Rfl: 3   Past Medical History:  Diagnosis Date   Chicken pox    Colon polyps    H/O rubella    History of measles    Hyperlipidemia    Psoriasis    Assessment and Plan:  1. Cervicalgia   2. Chronic neck pain   3. Cervical radicular pain   4. DDD (degenerative disc disease), cervical   5. Cervical facet syndrome   6. Right arm pain   7. Cervical radiculitis   8. Chronic pain syndrome   9. Chronic, continuous use of opioids    Based on our discussion today it is appropriate to  refill his tramadol and continue with the Flexeril and gabapentin.  I have encouraged him to use anti-inflammatories as necessary.  Avoid certain activities as he is doing.  He is not a candidate for possible dorsal column stimulator should the severity of pain intensify or fail to be covered with medication management.  We talked about the risks and potential benefits of this as well.  I do not feel this is indicated at this time.  I encouraged him to continue with stretching strengthening exercises and physical therapy and to contact us should anything change.  Continue follow-up with his primary care physicians with schedule return to clinic in 2 months. Follow Up Instructions:    I discussed the assessment and treatment plan with the patient. The patient was provided an opportunity to ask questions and all were answered. The patient agreed with the plan and demonstrated an understanding of the instructions.   The patient was advised to call back or seek an in-person evaluation if the symptoms worsen or if the condition fails to improve as anticipated.  I provided 30 minutes of non-face-to-face time during this encounter.   Yevette Edwards, MD

## 2023-05-13 ENCOUNTER — Telehealth: Payer: Self-pay | Admitting: Anesthesiology

## 2023-05-13 NOTE — Telephone Encounter (Signed)
Tramadol was increased from one tab three times per day to 2 tablets three times per day.

## 2023-05-13 NOTE — Telephone Encounter (Signed)
Spoke with Dr. Pernell Dupre, dose appropriate. Called [facility], they need to speak with the physician in order to fill the Rx for the higher dose of Tramadol.

## 2023-05-13 NOTE — Telephone Encounter (Signed)
Pharmacy called about the patient Tramadol prescription. They will like to know if patient can take this medication as needed. Pharmacy stated that the dosage seems to be high. Please give the pharmacy a call at [phone] refe no [phone] thanks

## 2023-05-21 ENCOUNTER — Ambulatory Visit: Payer: Managed Care, Other (non HMO) | Admitting: Internal Medicine

## 2023-05-21 ENCOUNTER — Encounter: Payer: Self-pay | Admitting: Internal Medicine

## 2023-05-21 VITALS — BP 110/70 | HR 87 | Temp 97.6°F | Ht 68.0 in | Wt 224.6 lb

## 2023-05-21 DIAGNOSIS — J00 Acute nasopharyngitis [common cold]: Secondary | ICD-10-CM | POA: Diagnosis not present

## 2023-05-21 LAB — POC COVID19/FLU A&B COMBO
Covid Antigen, POC: NEGATIVE
Influenza A Antigen, POC: NEGATIVE
Influenza B Antigen, POC: NEGATIVE

## 2023-05-21 MED ORDER — BENZONATATE 200 MG PO CAPS: 200.0000 mg | ORAL_CAPSULE | Freq: Two times a day (BID) | ORAL | 0 refills | Status: DC | PRN

## 2023-05-21 MED ORDER — AZITHROMYCIN 250 MG PO TABS: ORAL_TABLET | ORAL | 0 refills | Status: DC

## 2023-05-21 NOTE — Patient Instructions (Signed)

## 2023-05-21 NOTE — Progress Notes (Signed)
Subjective:    Patient ID: Isaiah Johnson, male    DOB: June 11, 1965, 58 y.o.   MRN: [id number]  HPI  Discussed the use of AI scribe software for clinical note transcription with the patient, who gave verbal consent to proceed.  Isaiah Johnson "[name]" is a 58 year old male who presents with upper respiratory symptoms. His wife is starting to feel unwell as well.  He has been experiencing upper respiratory symptoms since Thursday of last week, with worsening by Sunday. Initially, he had a runny nose and mild symptoms, but by Sunday, he developed a sore throat and nasal congestion. The nasal discharge is described as yellowish, turning darker when cleared in the shower this morning. No ear pain, nausea, vomiting, diarrhea, or fever, though he feels cold, attributing it to being outside in cold weather.  He has a cough with sputum production. He has taken over-the-counter medications including a decongestant on Sunday, NyQuil on Sunday night, and DayQuil on Monday.  He has no history of asthma, COPD, or allergies but is prone to developing bronchitis, which has previously taken a long time to resolve.       Review of Systems   Past Medical History:  Diagnosis Date   Chicken pox    Colon polyps    H/O rubella    History of measles    Hyperlipidemia    Psoriasis     Current Outpatient Medications  Medication Sig Dispense Refill   acetaminophen (TYLENOL) 500 MG tablet Take 500 mg by mouth every 6 (six) hours as needed.     aspirin EC 81 MG tablet Take 81 mg by mouth daily.     b complex vitamins capsule Take 1 capsule by mouth daily.     cyclobenzaprine (FLEXERIL) 10 MG tablet Take 1 tablet (10 mg total) by mouth 2 (two) times daily. 60 tablet 5   fenofibrate micronized (LOFIBRA) 67 MG capsule Take 1 capsule (67 mg total) by mouth daily before breakfast. 90 capsule 3   gabapentin (NEURONTIN) 300 MG capsule Take 1 capsule (300 mg total) by mouth 4 (four) times daily. 120 capsule 2    Melatonin 200 MCG TABS Take by mouth.     meloxicam (MOBIC) 7.5 MG tablet TAKE 1 TABLET BY MOUTH 2 TIMES DAILY AS NEEDED FOR PAIN. 60 tablet 2   Multiple Vitamin (MULTIVITAMIN WITH MINERALS) TABS tablet Take 1 tablet by mouth daily.     niacin 500 MG tablet Take 500 mg by mouth at bedtime.     Omega-3 Fatty Acids (FISH OIL) 1000 MG CAPS Take 1,000 mg by mouth daily.     Probiotic Product (PROBIOTIC DAILY PO) Take by mouth daily.     Semaglutide,0.25 or 0.5MG /DOS, 2 MG/1.5ML SOPN Inject 1 mg into the skin once a week. Fax order to [facility] compounded     traMADol (ULTRAM) 50 MG tablet Take 2 tablets (100 mg total) by mouth in the morning, at noon, and at bedtime. 180 tablet 2   valsartan (DIOVAN) 160 MG tablet Take 1 tablet (160 mg total) by mouth daily. 90 tablet 3   No current facility-administered medications for this visit.    No Known Allergies  Family History  Problem Relation Age of Onset   Hypertension Mother    Diabetes Mother    Heart disease Mother    Heart disease Father    Depression Father    Sleep apnea Brother    Colon cancer Maternal Uncle    Prostate cancer Neg  Hx     Social History   Socioeconomic History   Marital status: Married    Spouse name: Chiropodist   Number of children: Not on file   Years of education: High school   Highest education level: Not on file  Occupational History   Occupation: Environmental consultant  Tobacco Use   Smoking status: Never   Smokeless tobacco: Never  Substance and Sexual Activity   Alcohol use: Yes    Comment: 1 drink   Drug use: No   Sexual activity: Not on file  Other Topics Concern   Not on file  Social History Narrative   Not on file   Social Drivers of Health   Financial Resource Strain: Not on file  Food Insecurity: Not on file  Transportation Needs: Not on file  Physical Activity: Not on file  Stress: Not on file  Social Connections: Not on file  Intimate Partner Violence: Not on file      Constitutional: Pt reports headache and chills. Denies fever, malaise, fatigue, or abrupt weight changes.  HEENT: Pt reports runny nose, nasal congestion and sore throat. Denies eye pain, eye redness, ear pain, ringing in the ears, wax buildup, bloody nose. Respiratory: Pt reports cough. Denies difficulty breathing, shortness of breath.   Cardiovascular: Denies chest pain, chest tightness, palpitations or swelling in the hands or feet.  Gastrointestinal: Denies abdominal pain, bloating, constipation, diarrhea or blood in the stool.  Musculoskeletal: Denies decrease in range of motion, difficulty with gait, muscle pain or joint pain and swelling.  Skin: Denies redness, rashes, lesions or ulcercations.  Neurological: Denies dizziness, difficulty with memory, difficulty with speech or problems with balance and coordination.    No other specific complaints in a complete review of systems (except as listed in HPI above).      Objective:   Physical Exam  BP 110/70 (BP Location: Left Arm, Patient Position: Sitting, Cuff Size: Large)   Pulse 87   Temp 97.6 F (36.4 C)   Ht 5\' 8"  (1.727 m)   Wt 224 lb 9.6 oz (101.9 kg)   SpO2 94%   BMI 34.15 kg/m   Wt Readings from Last 3 Encounters:  11/28/22 253 lb (114.8 kg)  01/16/22 240 lb (108.9 kg)  10/18/21 240 lb (108.9 kg)    General: Appears his stated age, obese, in NAD. HEENT: Head: normal shape and size, no sinus tenderness noted; Eyes: sclera white, no icterus, conjunctiva pink, PERRLA and EOMs intact; Nose: mucosa pink and moist, septum midline; Throat/Mouth: Teeth present, mucosa pink and moist, + PND, no exudate, lesions or ulcerations noted.  Neck:  No adenopathy. Cardiovascular: Normal rate and rhythm. S1,S2 noted.  No murmur, rubs or gallops noted.  Pulmonary/Chest: Normal effort and positive vesicular breath sounds. No respiratory distress. No wheezes, rales or ronchi noted.  Musculoskeletal: No difficulty with gait.   Neurological: Alert and oriented.   BMET    Component Value Date/Time   NA 140 11/09/2020 0858   K 4.7 11/09/2020 0858   CL 105 11/09/2020 0858   CO2 27 11/09/2020 0858   GLUCOSE 100 (H) 11/09/2020 0858   BUN 15 11/09/2020 0858   CREATININE 0.99 11/09/2020 0858   CALCIUM 9.5 11/09/2020 0858   GFRNONAA 103 10/28/2019 1026   GFRAA 119 10/28/2019 1026    Lipid Panel     Component Value Date/Time   CHOL 173 11/09/2020 0858   TRIG 124 11/09/2020 0858   HDL 48 11/09/2020 0858   CHOLHDL  3.6 11/09/2020 0858   VLDL 18 01/05/2016 0820   LDLCALC 102 (H) 11/09/2020 0858    CBC    Component Value Date/Time   WBC 8.5 11/09/2020 0858   RBC 5.62 11/09/2020 0858   HGB 15.8 11/09/2020 0858   HCT 49.6 11/09/2020 0858   PLT 276 11/09/2020 0858   MCV 88.3 11/09/2020 0858   MCH 28.1 11/09/2020 0858   MCHC 31.9 (L) 11/09/2020 0858   RDW 12.6 11/09/2020 0858   LYMPHSABS 1,947 11/09/2020 0858   EOSABS 680 (H) 11/09/2020 0858   BASOSABS 60 11/09/2020 0858    Hgb A1C Lab Results  Component Value Date   HGBA1C 5.8 (A) 11/28/2022            Assessment & Plan:   Assessment and Plan    Upper Respiratory Infection Symptoms started last Thursday with runny nose and sore throat, worsened on Sunday. Yellowish nasal discharge and productive cough. No fever, nausea, vomiting, or diarrhea. Negative for COVID and flu. Patient has a history of bronchitis and is concerned about developing it again. -Start Azithromycin (Z-Pak). -Prescribe Tessalon 200mg  cough tablets, take three times a day. -Recommend over-the-counter Zyrtec and Flonase to help with runny nose, nasal congestion, and postnasal drip. -If not improved by Friday or Monday, patient to notify the office.       Follow-up with your PCP as previously scheduled Nicki Reaper, NP

## 2023-06-13 ENCOUNTER — Other Ambulatory Visit: Payer: Self-pay

## 2023-06-13 DIAGNOSIS — I10 Essential (primary) hypertension: Secondary | ICD-10-CM

## 2023-06-13 MED ORDER — VALSARTAN 160 MG PO TABS: 160.0000 mg | ORAL_TABLET | Freq: Every day | ORAL | 3 refills | Status: AC

## 2023-08-07 ENCOUNTER — Other Ambulatory Visit: Payer: Self-pay

## 2023-08-07 DIAGNOSIS — E7849 Other hyperlipidemia: Secondary | ICD-10-CM

## 2023-08-07 MED ORDER — FENOFIBRATE MICRONIZED 67 MG PO CAPS: 67.0000 mg | ORAL_CAPSULE | Freq: Every day | ORAL | 3 refills | Status: DC

## 2023-08-24 ENCOUNTER — Encounter: Payer: Self-pay | Admitting: Family Medicine

## 2023-09-10 ENCOUNTER — Encounter: Payer: Self-pay | Admitting: Family Medicine

## 2023-09-10 ENCOUNTER — Other Ambulatory Visit: Payer: Self-pay | Admitting: Family Medicine

## 2023-09-10 DIAGNOSIS — Z0000 Encounter for general adult medical examination without abnormal findings: Secondary | ICD-10-CM

## 2023-09-10 DIAGNOSIS — Z125 Encounter for screening for malignant neoplasm of prostate: Secondary | ICD-10-CM

## 2023-09-10 DIAGNOSIS — R7309 Other abnormal glucose: Secondary | ICD-10-CM

## 2023-09-10 DIAGNOSIS — E7849 Other hyperlipidemia: Secondary | ICD-10-CM

## 2023-09-10 DIAGNOSIS — I10 Essential (primary) hypertension: Secondary | ICD-10-CM

## 2023-09-11 ENCOUNTER — Other Ambulatory Visit

## 2023-09-12 LAB — COMPREHENSIVE METABOLIC PANEL WITH GFR
AG Ratio: 2 (calc) (ref 1.0–2.5)
ALT: 13 U/L (ref 9–46)
AST: 14 U/L (ref 10–35)
Albumin: 4.1 g/dL (ref 3.6–5.1)
Alkaline phosphatase (APISO): 65 U/L (ref 35–144)
BUN: 15 mg/dL (ref 7–25)
CO2: 25 mmol/L (ref 20–32)
Calcium: 9.4 mg/dL (ref 8.6–10.3)
Chloride: 106 mmol/L (ref 98–110)
Creat: 0.85 mg/dL (ref 0.70–1.30)
Globulin: 2.1 g/dL (ref 1.9–3.7)
Glucose, Bld: 92 mg/dL (ref 65–99)
Potassium: 4.4 mmol/L (ref 3.5–5.3)
Sodium: 141 mmol/L (ref 135–146)
Total Bilirubin: 0.4 mg/dL (ref 0.2–1.2)
Total Protein: 6.2 g/dL (ref 6.1–8.1)
eGFR: 101 mL/min/{1.73_m2} (ref >=60)

## 2023-09-12 LAB — CBC WITH DIFFERENTIAL/PLATELET
Absolute Lymphocytes: 1582 {cells}/uL (ref 850–3900)
Absolute Monocytes: 534 {cells}/uL (ref 200–950)
Basophils Absolute: 64 {cells}/uL (ref 0–200)
Basophils Relative: 0.7 %
Eosinophils Absolute: 543 {cells}/uL — ABNORMAL HIGH (ref 15–500)
Eosinophils Relative: 5.9 %
HCT: 49.5 % (ref 38.5–50.0)
Hemoglobin: 15.7 g/dL (ref 13.2–17.1)
MCH: 27.8 pg (ref 27.0–33.0)
MCHC: 31.7 g/dL — ABNORMAL LOW (ref 32.0–36.0)
MCV: 87.6 fL (ref 80.0–100.0)
MPV: 10.3 fL (ref 7.5–12.5)
Monocytes Relative: 5.8 %
Neutro Abs: 6477 {cells}/uL (ref 1500–7800)
Neutrophils Relative %: 70.4 %
Platelets: 293 10*3/uL (ref 140–400)
RBC: 5.65 10*6/uL (ref 4.20–5.80)
RDW: 12.5 % (ref 11.0–15.0)
Total Lymphocyte: 17.2 %
WBC: 9.2 10*3/uL (ref 3.8–10.8)

## 2023-09-12 LAB — LIPID PANEL
Cholesterol: 171 mg/dL (ref <200)
HDL: 36 mg/dL — ABNORMAL LOW (ref >=40)
LDL Cholesterol (Calc): 112 mg/dL — ABNORMAL HIGH
Non-HDL Cholesterol (Calc): 135 mg/dL — ABNORMAL HIGH (ref <130)
Total CHOL/HDL Ratio: 4.8 (calc) (ref <5.0)
Triglycerides: 118 mg/dL (ref <150)

## 2023-09-12 LAB — TSH: TSH: 0.94 m[IU]/L (ref 0.40–4.50)

## 2023-09-12 LAB — HEMOGLOBIN A1C
Hgb A1c MFr Bld: 5.4 % (ref <5.7)
Mean Plasma Glucose: 108 mg/dL
eAG (mmol/L): 6 mmol/L

## 2023-09-12 LAB — PSA: PSA: 2.13 ng/mL (ref <=4.00)

## 2023-09-18 ENCOUNTER — Encounter: Admitting: Family Medicine

## 2023-09-26 ENCOUNTER — Telehealth: Payer: Self-pay | Admitting: *Deleted

## 2023-09-26 NOTE — Telephone Encounter (Signed)
 Called patient to let him know that we can not replace lost or stolen medications and the impact of not knowing where the Tramadol  is.  He does have an appt on 10/02/23 with Dr Welton Hall and I told him he could discuss with him what to do about staying with [facility] vs changing to another pharmacy that patient can physically pick up medication.  Patient verbalizes u/o information.

## 2023-10-02 ENCOUNTER — Encounter: Payer: Self-pay | Admitting: Anesthesiology

## 2023-10-02 ENCOUNTER — Ambulatory Visit: Attending: Anesthesiology | Admitting: Anesthesiology

## 2023-10-02 VITALS — BP 126/87 | HR 86 | Temp 97.3°F | Resp 16 | Ht 69.0 in | Wt 210.0 lb

## 2023-10-02 DIAGNOSIS — M5030 Other cervical disc degeneration, unspecified cervical region: Secondary | ICD-10-CM

## 2023-10-02 DIAGNOSIS — M542 Cervicalgia: Secondary | ICD-10-CM | POA: Insufficient documentation

## 2023-10-02 DIAGNOSIS — M4722 Other spondylosis with radiculopathy, cervical region: Secondary | ICD-10-CM | POA: Diagnosis not present

## 2023-10-02 DIAGNOSIS — G8929 Other chronic pain: Secondary | ICD-10-CM | POA: Diagnosis not present

## 2023-10-02 DIAGNOSIS — M47812 Spondylosis without myelopathy or radiculopathy, cervical region: Secondary | ICD-10-CM | POA: Diagnosis present

## 2023-10-02 DIAGNOSIS — M79601 Pain in right arm: Secondary | ICD-10-CM

## 2023-10-02 DIAGNOSIS — M5412 Radiculopathy, cervical region: Secondary | ICD-10-CM | POA: Insufficient documentation

## 2023-10-02 MED ORDER — CYCLOBENZAPRINE HCL 10 MG PO TABS: 10.0000 mg | ORAL_TABLET | Freq: Two times a day (BID) | ORAL | 5 refills | Status: DC

## 2023-10-02 MED ORDER — MELOXICAM 7.5 MG PO TABS: 7.5000 mg | ORAL_TABLET | Freq: Two times a day (BID) | ORAL | 5 refills | Status: DC

## 2023-10-02 MED ORDER — TRAMADOL HCL 50 MG PO TABS: 100.0000 mg | ORAL_TABLET | Freq: Three times a day (TID) | ORAL | 2 refills | Status: AC

## 2023-10-02 NOTE — Patient Instructions (Addendum)
 ______________________________________________________________________    Medication Recommendations and Reminders  Applies to: All patients receiving prescriptions (written and/or electronic).  Medication Rules & Regulations: You are responsible for reading, knowing, and following our Medication Rules document. These exist for your safety and that of others. They are not flexible and neither are we. Dismissing or ignoring them is an act of non-compliance that may result in complete and irreversible termination of such medication therapy. For safety reasons, non-compliance will not be tolerated. As with the [place] fundamental legal principle of ignorance of the law is no defense, we will accept no excuses for not having read and knowing the content of documents provided to you by our practice.  Pharmacy of record:  Definition: This is the pharmacy where your electronic prescriptions will be sent.  We do not endorse any particular pharmacy. It is up to you and your insurance to decide what pharmacy to use.  We do not restrict you in your choice of pharmacy. However, once we write for your prescriptions, we will NOT be re-sending more prescriptions to fix restricted supply problems created by your pharmacy, or your insurance.  The pharmacy listed in the electronic medical record should be the one where you want electronic prescriptions to be sent. If you choose to change pharmacy, simply notify our nursing staff. Changes will be made only during your regular appointments and not over the phone.  Recommendations: Keep all of your pain medications in a safe place, under lock and key, even if you live alone. We will NOT replace lost, stolen, or damaged medication. We do not accept Police Reports as proof of medications having been stolen. After you fill your prescription, take 1 week's worth of pills and put them away in a safe place. You should keep a separate, properly labeled bottle for this  purpose. The remainder should be kept in the original bottle. Use this as your primary supply, until it runs out. Once it's gone, then you know that you have 1 week's worth of medicine, and it is time to come in for a prescription refill. If you do this correctly, it is unlikely that you will ever run out of medicine. To make sure that the above recommendation works, it is very important that you make sure your medication refill appointments are scheduled at least 1 week before you run out of medicine. To do this in an effective manner, make sure that you do not leave the office without scheduling your next medication management appointment. Always ask the nursing staff to show you in your prescription , when your medication will be running out. Then arrange for the receptionist to get you a return appointment, at least 7 days before you run out of medicine. Do not wait until you have 1 or 2 pills left, to come in. This is very poor planning and does not take into consideration that we may need to cancel appointments due to bad weather, sickness, or emergencies affecting our staff. DO NOT ACCEPT A Partial Fill: If for any reason your pharmacy does not have enough pills/tablets to completely fill or refill your prescription, do not allow for a partial fill. The law allows the pharmacy to complete that prescription within 72 hours, without requiring a new prescription. If they do not fill the rest of your prescription within those 72 hours, you will need a separate prescription to fill the remaining amount, which we will NOT provide. If the reason for the partial fill is your insurance, you will  need to talk to the pharmacist about payment alternatives for the remaining tablets, but again, DO NOT ACCEPT A PARTIAL FILL, unless you can trust your pharmacist to obtain the remainder of the pills within 72 hours.  Prescription refills and/or changes in medication(s):  Prescription refills, and/or changes in dose  or medication, will be conducted only during scheduled medication management appointments. (Applies to both, written and electronic prescriptions.) No refills on procedure days. No medication will be changed or started on procedure days. No changes, adjustments, and/or refills will be conducted on a procedure day. Doing so will interfere with the diagnostic portion of the procedure. No phone refills. No medications will be called into the pharmacy. No Fax refills. No weekend refills. No Holliday refills. No after hours refills.  Remember:  Business hours are:  Monday to Thursday 8:00 AM to 4:00 PM Provider's Schedule: Eric Como, MD - Appointments are:  Medication management: Monday and Wednesday 8:00 AM to 4:00 PM Procedure day: Tuesday and Thursday 7:30 AM to 4:00 PM Wallie Sherry, MD - Appointments are:  Medication management: Tuesday and Thursday 8:00 AM to 4:00 PM Procedure day: Monday and Wednesday 7:30 AM to 4:00 PM (Last update: 01/30/2022) ______________________________________________________________________     ______________________________________________________________________    WARNING: CBD (cannabidiol) & Delta (Delta-8 tetrahydrocannabinol) products.   Applicable to:  All individuals currently taking or considering taking CBD (cannabidiol) and, more important, all patients taking opioid analgesic controlled substances (pain medication). (Example: oxycodone ; oxymorphone; hydrocodone; hydromorphone; morphine; methadone ; tramadol ; tapentadol ; fentanyl ; buprenorphine ; butorphanol; dextromethorphan; meperidine; codeine; etc.)  Introduction:  Recently there has been a drive towards the use of natural products for the treatment of different conditions, including pain anxiety and sleep disorders. Marijuana and hemp are two varieties of the cannabis genus plants. Marijuana and its derivatives are illegal, while hemp and its derivatives are not. Cannabidiol (CBD) and  tetrahydrocannabinol (THC), are two natural compounds found in plants of the Cannabis genus. They can both be extracted from hemp or marijuana. Both compounds interact with your body's endocannabinoid system in very different ways. CBD is associated with pain relief (analgesia) while THC is associated with the psychoactive effects (the high) obtained from the use of marijuana products. There are two main types of THC: Delta-9, which comes from the marijuana plant and it is illegal, and Delta-8, which comes from the hemp plant, and it is legal. (Both, Delta-9-THC and Delta-8-THC are psychoactive and give you the high.)   Legality:  Marijuana and its derivatives: illegal Hemp and its derivatives: Legal (State dependent) UPDATE: (05/26/2021) The Drug Enforcement Agency (DEA) issued a letter stating that delta cannabinoids, including Delta-8-THCO and Delta-9-THCO, synthetically derived from hemp do not qualify as hemp and will be viewed as Schedule I drugs. (Schedule I drugs, substances, or chemicals are defined as drugs with no currently accepted medical use and a high potential for abuse. Some examples of Schedule I drugs are: heroin, lysergic acid diethylamide (LSD), marijuana (cannabis), 3,4-methylenedioxymethamphetamine (ecstasy), methaqualone, and peyote.) ([employer])  Legal status of CBD in [place]:  Conditionally Legal  Reference: FDA Regulation of Cannabis and Cannabis-Derived Products, Including Cannabidiol (CBD) - [employer]  Warning:  CBD is not FDA approved and has not undergo the same manufacturing controls as prescription drugs.  This means that the purity and safety of available CBD may be questionable. Most of the time, despite manufacturer's claims, it is contaminated with THC (delta-9-tetrahydrocannabinol - the chemical in marijuana responsible for the  HIGH).  When this is the case, the THC  contaminant will trigger a positive urine drug screen (UDS) test for Marijuana (carboxy-THC).   The FDA recently put out a warning about 5 things that everyone should be aware of regarding Delta-8 THC: Delta-8 THC products have not been evaluated or approved by the FDA for safe use and may be marketed in ways that put the public health at risk. The FDA has received adverse event reports involving delta-8 THC-containing products. Delta-8 THC has psychoactive and intoxicating effects. Delta-8 THC manufacturing often involve use of potentially harmful chemicals to create the concentrations of delta-8 THC claimed in the marketplace. The final delta-8 THC product may have potentially harmful by-products (contaminants) due to the chemicals used in the process. Manufacturing of delta-8 THC products may occur in uncontrolled or unsanitary settings, which may lead to the presence of unsafe contaminants or other potentially harmful substances. Delta-8 THC products should be kept out of the reach of children and pets.  NOTE: Because a positive UDS for any illicit substance is a violation of our medication agreement, your opioid analgesics (pain medicine) may be permanently discontinued.  MORE ABOUT CBD  General Information: CBD was discovered in [birth year] and it is a derivative of the cannabis sativa genus plants (Marijuana and Hemp). It is one of the 113 identified substances found in Marijuana. It accounts for up to 40% of the plant's extract. As of 2018, preliminary clinical studies on CBD included research for the treatment of anxiety, movement disorders, and pain. CBD is available and consumed in multiple forms, including inhalation of smoke or vapor, as an aerosol spray, and by mouth. It may be supplied as an oil containing CBD, capsules, dried cannabis, or as a liquid solution. CBD is thought not to be as psychoactive as THC (delta-9-tetrahydrocannabinol - the chemical in  marijuana responsible for the HIGH). Studies suggest that CBD may interact with different biological target receptors in the body, including cannabinoid and other neurotransmitter receptors. As of 2018 the mechanism of action for its biological effects has not been determined.  Side-effects  Adverse reactions: Dry mouth, diarrhea, decreased appetite, fatigue, drowsiness, malaise, weakness, sleep disturbances, and others.  Drug interactions:  CBD may interact with medications such as blood-thinners. CBD causes drowsiness on its own and it will increase drowsiness caused by other medications, including antihistamines (such as Benadryl), benzodiazepines (Xanax , Ativan, Valium), antipsychotics, antidepressants, opioids, alcohol and supplements such as kava, melatonin and St. John's Wort.  Other drug interactions: Brivaracetam (Briviact); Caffeine; Carbamazepine (Tegretol); Citalopram (Celexa); Clobazam (Onfi); Eslicarbazepine (Aptiom); Everolimus (Zostress); Lithium; Methadone  (Dolophine ); Rufinamide (Banzel); Sedative medications (CNS depressants); Sirolimus (Rapamune); Stiripentol (Diacomit); Tacrolimus (Prograf); Tamoxifen ; Soltamox); Topiramate (Topamax); Valproate; Warfarin (Coumadin); Zonisamide. (Last update: 03/19/2022) ___________________________________________  Prescriptions for Tramadol , Meloxicam , and Flexeril  were sent to your pharmacy.

## 2023-10-02 NOTE — Progress Notes (Signed)
 Nursing Pain Medication Assessment:  Safety precautions to be maintained throughout the outpatient stay will include: orient to surroundings, keep bed in low position, maintain call bell within reach at all times, provide assistance with transfer out of bed and ambulation.  Medication Inspection Compliance: Mr. Kader did not comply with our request to bring his pills to be counted. He was reminded that bringing the medication bottles, even when empty, is a requirement.  Medication: None brought in. Pill/Patch Count: None available to be counted. Bottle Appearance: No container available. Did not bring bottle(s) to appointment. Filled Date: N/A Last Medication intake:  TodaySafety precautions to be maintained throughout the outpatient stay will include: orient to surroundings, keep bed in low position, maintain call bell within reach at all times, provide assistance with transfer out of bed and ambulation.   Patient informed that pills must be counted at every med mgmt appt.

## 2023-10-23 NOTE — Progress Notes (Signed)
 Subjective:  Patient ID: Isaiah Johnson, male    DOB: 27-Apr-1965  Age: 58 y.o. MRN: [id number]  CC: Neck Pain   Procedure: None  HPI Isaiah Johnson presents for reevaluation.  His last appointment was a few months ago.  He still having a fair amount of cervical pain comparable to what he had in the past.  He gets a lot of muscle spasms in the back of the neck running into the trapezius on both sides.  His arm strength appears to be well-preserved and he is having less symptoms of the numbness and tingling affecting the fingers and hands.  Hand grasp strength has been reasonable he reports.  He takes his tramadol  as prescribed and this does help significantly with the pain he experiences.  It knocks it down by about 25 to 50% lasting about 4 to 6 hours.  He takes his Flexeril  to help with muscle spasms additionally.  That in combination with the meloxicam  seems to work fairly well with him.  He is trying to do his stretching strengthening exercises as tolerated.  Otherwise the quality characteristic and distribution of the pain is stable in nature.  Outpatient Medications Prior to Visit  Medication Sig Dispense Refill   acetaminophen (TYLENOL) 500 MG tablet Take 500 mg by mouth every 6 (six) hours as needed.     aspirin EC 81 MG tablet Take 81 mg by mouth daily.     b complex vitamins capsule Take 1 capsule by mouth daily.     fenofibrate  micronized (LOFIBRA) 67 MG capsule Take 1 capsule (67 mg total) by mouth daily before breakfast. 90 capsule 3   gabapentin  (NEURONTIN ) 300 MG capsule Take 1 capsule (300 mg total) by mouth 4 (four) times daily. 120 capsule 2   Melatonin 200 MCG TABS Take by mouth.     Multiple Vitamin (MULTIVITAMIN WITH MINERALS) TABS tablet Take 1 tablet by mouth daily.     niacin 500 MG tablet Take 500 mg by mouth at bedtime.     Omega-3 Fatty Acids (FISH OIL) 1000 MG CAPS Take 1,000 mg by mouth daily.     Probiotic Product (PROBIOTIC DAILY PO) Take by mouth daily.      Semaglutide ,0.25 or 0.5MG /DOS, 2 MG/1.5ML SOPN Inject 1 mg into the skin once a week. Fax order to [facility] compounded     valsartan  (DIOVAN ) 160 MG tablet Take 1 tablet (160 mg total) by mouth daily. 90 tablet 3   cyclobenzaprine  (FLEXERIL ) 10 MG tablet Take 1 tablet (10 mg total) by mouth 2 (two) times daily. 60 tablet 5   meloxicam  (MOBIC ) 7.5 MG tablet TAKE 1 TABLET BY MOUTH 2 TIMES DAILY AS NEEDED FOR PAIN. 60 tablet 2   traMADol  (ULTRAM ) 50 MG tablet Take 2 tablets (100 mg total) by mouth in the morning, at noon, and at bedtime. 180 tablet 2   azithromycin  (ZITHROMAX ) 250 MG tablet Take 2 tabs today, then 1 tab daily x 4 days (Patient not taking: Reported on 10/02/2023) 6 tablet 0   benzonatate  (TESSALON ) 200 MG capsule Take 1 capsule (200 mg total) by mouth 2 (two) times daily as needed for cough. (Patient not taking: Reported on 10/02/2023) 20 capsule 0   No facility-administered medications prior to visit.    Review of Systems CNS: No confusion or sedation Cardiac: No angina or palpitations GI: No abdominal pain or constipation Constitutional: No nausea vomiting fevers or chills  Objective:  BP 126/87 (Cuff Size: Normal)   Pulse 86  Temp (!) 97.3 F (36.3 C) (Temporal)   Resp 16   Ht 5' 9 (1.753 m)   Wt 210 lb (95.3 kg)   SpO2 98%   BMI 31.01 kg/m    BP Readings from Last 3 Encounters:  10/02/23 126/87  05/21/23 110/70  11/28/22 108/80     Wt Readings from Last 3 Encounters:  10/02/23 210 lb (95.3 kg)  05/21/23 224 lb 9.6 oz (101.9 kg)  11/28/22 253 lb (114.8 kg)     Physical Exam Pt is alert and oriented PERRL EOMI HEART IS RRR no murmur or rub LCTA no wheezing or rales MUSCULOSKELETAL reveals some paraspinous muscle tenderness in the neck region.  Some tenderness in the trapezius and splenius capitis with reasonable range of motion at the atlantooccipital joint.  Good range of motion at the glenohumeral joint.  Good muscle tone and bulk.  Labs  Lab  Results  Component Value Date   HGBA1C 5.4 09/11/2023   HGBA1C 5.8 (A) 11/28/2022   HGBA1C 5.6 11/09/2020   Lab Results  Component Value Date   LDLCALC 112 (H) 09/11/2023   CREATININE 0.85 09/11/2023    -------------------------------------------------------------------------------------------------------------------- Lab Results  Component Value Date   WBC 9.2 09/11/2023   HGB 15.7 09/11/2023   HCT 49.5 09/11/2023   PLT 293 09/11/2023   GLUCOSE 92 09/11/2023   CHOL 171 09/11/2023   TRIG 118 09/11/2023   HDL 36 (L) 09/11/2023   LDLCALC 112 (H) 09/11/2023   ALT 13 09/11/2023   AST 14 09/11/2023   NA 141 09/11/2023   K 4.4 09/11/2023   CL 106 09/11/2023   CREATININE 0.85 09/11/2023   BUN 15 09/11/2023   CO2 25 09/11/2023   TSH 0.94 09/11/2023   PSA 2.13 09/11/2023   HGBA1C 5.4 09/11/2023    --------------------------------------------------------------------------------------------------------------------- No results found.   Assessment & Plan:   Isaiah Johnson was seen today for neck pain.  Diagnoses and all orders for this visit:  Cervicalgia  Chronic neck pain  Cervical radicular pain  DDD (degenerative disc disease), cervical  Cervical facet syndrome  Right arm pain  Cervical radiculitis  Other orders -     traMADol  (ULTRAM ) 50 MG tablet; Take 2 tablets (100 mg total) by mouth in the morning, at noon, and at bedtime. -     cyclobenzaprine  (FLEXERIL ) 10 MG tablet; Take 1 tablet (10 mg total) by mouth 2 (two) times daily. -     meloxicam  (MOBIC ) 7.5 MG tablet; Take 1 tablet (7.5 mg total) by mouth 2 (two) times daily.        ----------------------------------------------------------------------------------------------------------------------  Problem List Items Addressed This Visit       Unprioritized   Chronic neck pain   Relevant Medications   traMADol  (ULTRAM ) 50 MG tablet   cyclobenzaprine  (FLEXERIL ) 10 MG tablet   meloxicam  (MOBIC )  7.5 MG tablet   Right arm pain   Other Visit Diagnoses       Cervicalgia    -  Primary     Cervical radicular pain         DDD (degenerative disc disease), cervical         Cervical facet syndrome         Cervical radiculitis       Relevant Medications   cyclobenzaprine  (FLEXERIL ) 10 MG tablet         ----------------------------------------------------------------------------------------------------------------------  1. Cervicalgia (Primary) Will continue with his current regimen.  I think he is doing well with the medications he is  using with refills given today.  Continue with physical therapy and we will defer on any repeat injections at this time.  2. Chronic neck pain As above.  I have reviewed the [place]  practitioner database information as appropriate.  3. Cervical radicular pain As above  4. DDD (degenerative disc disease), cervical As above  5. Cervical facet syndrome As above      6. Right arm pain   7. Cervical radiculitis     ----------------------------------------------------------------------------------------------------------------------  I have changed Isaiah Johnson meloxicam . I am also having him maintain his multivitamin with minerals, aspirin EC, niacin, Fish Oil, b complex vitamins, Probiotic Product (PROBIOTIC DAILY PO), acetaminophen, Melatonin, Semaglutide (0.25 or 0.5MG /DOS), gabapentin , azithromycin , benzonatate , valsartan , fenofibrate  micronized, traMADol , and cyclobenzaprine .   Meds ordered this encounter  Medications   traMADol  (ULTRAM ) 50 MG tablet    Sig: Take 2 tablets (100 mg total) by mouth in the morning, at noon, and at bedtime.    Dispense:  180 tablet    Refill:  2    Not to exceed 3 additional fills before 01/27/2023   cyclobenzaprine  (FLEXERIL ) 10 MG tablet    Sig: Take 1 tablet (10 mg total) by mouth 2 (two) times daily.    Dispense:  60 tablet    Refill:  5   meloxicam  (MOBIC ) 7.5 MG tablet     Sig: Take 1 tablet (7.5 mg total) by mouth 2 (two) times daily.    Dispense:  60 tablet    Refill:  5   Patient's Medications  New Prescriptions   No medications on file  Previous Medications   ACETAMINOPHEN (TYLENOL) 500 MG TABLET    Take 500 mg by mouth every 6 (six) hours as needed.   ASPIRIN EC 81 MG TABLET    Take 81 mg by mouth daily.   AZITHROMYCIN  (ZITHROMAX ) 250 MG TABLET    Take 2 tabs today, then 1 tab daily x 4 days   B COMPLEX VITAMINS CAPSULE    Take 1 capsule by mouth daily.   BENZONATATE  (TESSALON ) 200 MG CAPSULE    Take 1 capsule (200 mg total) by mouth 2 (two) times daily as needed for cough.   FENOFIBRATE  MICRONIZED (LOFIBRA) 67 MG CAPSULE    Take 1 capsule (67 mg total) by mouth daily before breakfast.   GABAPENTIN  (NEURONTIN ) 300 MG CAPSULE    Take 1 capsule (300 mg total) by mouth 4 (four) times daily.   MELATONIN 200 MCG TABS    Take by mouth.   MULTIPLE VITAMIN (MULTIVITAMIN WITH MINERALS) TABS TABLET    Take 1 tablet by mouth daily.   NIACIN 500 MG TABLET    Take 500 mg by mouth at bedtime.   OMEGA-3 FATTY ACIDS (FISH OIL) 1000 MG CAPS    Take 1,000 mg by mouth daily.   PROBIOTIC PRODUCT (PROBIOTIC DAILY PO)    Take by mouth daily.   SEMAGLUTIDE ,0.25 OR 0.5MG /DOS, 2 MG/1.5ML SOPN    Inject 1 mg into the skin once a week. Fax order to [facility] compounded   VALSARTAN  (DIOVAN ) 160 MG TABLET    Take 1 tablet (160 mg total) by mouth daily.  Modified Medications   Modified Medication Previous Medication   CYCLOBENZAPRINE  (FLEXERIL ) 10 MG TABLET cyclobenzaprine  (FLEXERIL ) 10 MG tablet      Take 1 tablet (10 mg total) by mouth 2 (two) times daily.    Take 1 tablet (10 mg total) by mouth 2 (two) times daily.   MELOXICAM  (MOBIC ) 7.5 MG  TABLET meloxicam  (MOBIC ) 7.5 MG tablet      Take 1 tablet (7.5 mg total) by mouth 2 (two) times daily.    TAKE 1 TABLET BY MOUTH 2 TIMES DAILY AS NEEDED FOR PAIN.   TRAMADOL  (ULTRAM ) 50 MG TABLET traMADol  (ULTRAM ) 50 MG tablet      Take 2  tablets (100 mg total) by mouth in the morning, at noon, and at bedtime.    Take 2 tablets (100 mg total) by mouth in the morning, at noon, and at bedtime.  Discontinued Medications   No medications on file   ----------------------------------------------------------------------------------------------------------------------  Follow-up: Return in about 3 months (around 01/02/2024) for evaluation, med refill.    Lynwood KANDICE Clause, MD

## 2023-11-03 ENCOUNTER — Other Ambulatory Visit: Payer: Self-pay | Admitting: Anesthesiology

## 2023-11-13 ENCOUNTER — Other Ambulatory Visit: Payer: Self-pay | Admitting: Family Medicine

## 2023-11-13 ENCOUNTER — Encounter: Payer: Self-pay | Admitting: Family Medicine

## 2023-11-13 ENCOUNTER — Ambulatory Visit (INDEPENDENT_AMBULATORY_CARE_PROVIDER_SITE_OTHER): Admitting: Family Medicine

## 2023-11-13 VITALS — BP 122/78 | HR 81 | Ht 69.0 in | Wt 211.1 lb

## 2023-11-13 DIAGNOSIS — I10 Essential (primary) hypertension: Secondary | ICD-10-CM

## 2023-11-13 DIAGNOSIS — E7849 Other hyperlipidemia: Secondary | ICD-10-CM

## 2023-11-13 DIAGNOSIS — R7309 Other abnormal glucose: Secondary | ICD-10-CM

## 2023-11-13 DIAGNOSIS — Z0000 Encounter for general adult medical examination without abnormal findings: Secondary | ICD-10-CM

## 2023-11-13 DIAGNOSIS — Z125 Encounter for screening for malignant neoplasm of prostate: Secondary | ICD-10-CM

## 2023-11-13 NOTE — Patient Instructions (Addendum)
 Thank you for coming to the office today.  Ordered - Coronary Calcium Score Cardiac CT Scan. This is a screening test for patients aged 57-50+ with cardiovascular risk factors or who are healthy but would be interested in Cardiovascular Screening for heart disease. Even if there is a family history of heart disease, this imaging can be useful. Typically it can be done every 5+ years or at a different timeline we agree on  The scan will look at the chest and mainly focus on the heart and identify early signs of calcium build up or blockages within the heart arteries. It is not 100% accurate for identifying blockages or heart disease, but it is useful to help us  predict who may have some early changes or be at risk in the future for a heart attack or cardiovascular problem.  The results are reviewed by a Cardiologist and they will document the results. It should become available on MyChart. Typically the results are divided into percentiles based on other patients of the same demographic and age. So it will compare your risk to others similar to you. If you have a higher score >99 or higher percentile >75%tile, it is recommended to consider Statin cholesterol therapy and or referral to Cardiologist. I will try to help explain your results and if we have questions we can contact the Cardiologist.  You will be contacted for scheduling. Usually it is done at any imaging facility through [facility], [facility] or [facility].  The cost is $99 flat fee total and it does not go through insurance, so no authorization is required.  --------------  Keep on the compounded injection for now, we can consider finding other locations if [name] runs low  [facility] may be able to assist as well.   For Weight Loss / Obesity only   Contrave - oral medication, appetite suppression has wellbutrin/bupropion and naltrexone in it and it can also help with appetite, it is  ordered through a speciality pharmacy. - $99 per month   Try 1 week sample Contrave - Week 1: 1 pill daily with meal - Week 2: 1 pill twice a day with meal - Week 3: 2 pill with meal in AM and 1 pill with PM meal - Week 4: 2 pill twice a day with meal  DUE for FASTING BLOOD WORK (no food or drink after midnight before the lab appointment, only water or coffee without cream/sugar on the morning of)  SCHEDULE Lab Only visit in the morning at the clinic for lab draw in 1 year   - Make sure Lab Only appointment is at about 1 week before your next appointment, so that results will be available  For Lab Results, once available within 2-3 days of blood draw, you can can log in to MyChart online to view your results and a brief explanation. Also, we can discuss results at next follow-up visit.    Please schedule a Follow-up Appointment to: Return in about 1 year (around 11/12/2024) for 1 year fasting lab > 1 week later Annual Physical.  If you have any other questions or concerns, please feel free to call the office or send a message through MyChart. You may also schedule an earlier appointment if necessary.  Additionally, you may be receiving a survey about your experience at our office within a few days to 1 week by e-mail or mail. We value your feedback.  Marsa Officer, DO [facility], [place]

## 2023-11-13 NOTE — Progress Notes (Signed)
 Subjective:    Patient ID: Isaiah Johnson, male    DOB: [birth year], 58 y.o.   MRN: [id number]  Isaiah Johnson is a 58 y.o. male presenting on 11/13/2023 for Annual Exam   HPI  Discussed the use of AI scribe software for clinical note transcription with the patient, who gave verbal consent to proceed.  History of Present Illness   Isaiah Johnson is a 58 year old male who presents for an annual physical exam.  Elevated A1c / Obesity  Metabolic health and weight management - Hemoglobin A1c is 5.4, decreased from 5.8 a year ago - Weight loss of approximately 40 pounds over the past year, from 253 pounds to 211 pounds and down 13-15 lbs in past 6 months - Weight loss attributed to dietary changes and weekly semaglutide  injections - Currently on compounded 2.4mg  semaglutide , with a recent dosage change and approximately four to five weeks of medication remaining - [name]  CHRONIC HTN: Reports controlled Med: Valsartan  160mg  daily Lifestyle: Drinks coffee, soda Admits occasional sweats / heat intolerance Denies CP, dyspnea, HA, edema, dizziness / lightheadedness   Hyperlipidemia Lipid panel is well controlled, LDL is in 110s On Fenofibrate  67 daily  Off Pravastatin  40mg  since 2023 due to possible myalgia side effect   Health Maintenance:  Future Prevnar-20 vaccine if interested 50+  Future Flu Shot at work  Colonoscopy last done 2024, next due in 5 years . 2029  PSA 2.13, negative     10/02/2023    8:59 AM 11/28/2022    1:50 PM 10/18/2021    9:17 AM  Depression screen PHQ 2/9  Decreased Interest 0 0 0  Down, Depressed, Hopeless 0 0 0  PHQ - 2 Score 0 0 0  Altered sleeping  0   Tired, decreased energy  0   Change in appetite  0   Feeling bad or failure about yourself   0   Trouble concentrating  0   Moving slowly or fidgety/restless  0   Suicidal thoughts  0   PHQ-9 Score  0   Difficult doing work/chores  Not difficult at all        11/28/2022     1:50 PM 11/09/2020    8:24 AM  GAD 7 : Generalized Anxiety Score  Nervous, Anxious, on Edge 0 0  Control/stop worrying 0 0  Worry too much - different things 0 0  Trouble relaxing 0 0  Restless 0 0  Easily annoyed or irritable 0 0  Afraid - awful might happen 0 0  Total GAD 7 Score 0 0  Anxiety Difficulty Not difficult at all Not difficult at all     Past Medical History:  Diagnosis Date   Chicken pox    Colon polyps    H/O rubella    History of measles    Hyperlipidemia    Psoriasis    Past Surgical History:  Procedure Laterality Date   CHOLECYSTECTOMY  1996   NECK SURGERY  2020   Social History   Socioeconomic History   Marital status: Married    Spouse name: Corean Marchi   Number of children: Not on file   Years of education: High school   Highest education level: Not on file  Occupational History   Occupation: Environmental consultant  Tobacco Use   Smoking status: Never   Smokeless tobacco: Never  Substance and Sexual Activity   Alcohol use: Yes    Comment: 1 drink   Drug use: No  Sexual activity: Not on file  Other Topics Concern   Not on file  Social History Narrative   Not on file   Social Drivers of Health   Financial Resource Strain: Not on file  Food Insecurity: Not on file  Transportation Needs: Not on file  Physical Activity: Not on file  Stress: Not on file  Social Connections: Not on file  Intimate Partner Violence: Not on file   Family History  Problem Relation Age of Onset   Hypertension Mother    Diabetes Mother    Heart disease Mother    Heart disease Father    Depression Father    Sleep apnea Brother    Colon cancer Maternal Uncle    Prostate cancer Neg Hx    Current Outpatient Medications on File Prior to Visit  Medication Sig   acetaminophen (TYLENOL) 500 MG tablet Take 500 mg by mouth every 6 (six) hours as needed.   aspirin EC 81 MG tablet Take 81 mg by mouth daily.   b complex vitamins capsule Take 1 capsule by  mouth daily.   cyclobenzaprine  (FLEXERIL ) 10 MG tablet Take 1 tablet (10 mg total) by mouth 2 (two) times daily.   fenofibrate  micronized (LOFIBRA) 67 MG capsule Take 1 capsule (67 mg total) by mouth daily before breakfast.   gabapentin  (NEURONTIN ) 300 MG capsule Take 1 capsule (300 mg total) by mouth 4 (four) times daily.   Melatonin 200 MCG TABS Take by mouth.   meloxicam  (MOBIC ) 7.5 MG tablet Take 1 tablet (7.5 mg total) by mouth 2 (two) times daily.   Multiple Vitamin (MULTIVITAMIN WITH MINERALS) TABS tablet Take 1 tablet by mouth daily.   niacin 500 MG tablet Take 500 mg by mouth at bedtime.   Omega-3 Fatty Acids (FISH OIL) 1000 MG CAPS Take 1,000 mg by mouth daily.   Probiotic Product (PROBIOTIC DAILY PO) Take by mouth daily.   Semaglutide ,0.25 or 0.5MG /DOS, 2 MG/1.5ML SOPN Inject 1 mg into the skin once a week. Fax order to [facility] compounded   traMADol  (ULTRAM ) 50 MG tablet Take 2 tablets (100 mg total) by mouth in the morning, at noon, and at bedtime.   valsartan  (DIOVAN ) 160 MG tablet Take 1 tablet (160 mg total) by mouth daily.   No current facility-administered medications on file prior to visit.    Review of Systems  Constitutional:  Negative for activity change, appetite change, chills, diaphoresis, fatigue and fever.  HENT:  Negative for congestion and hearing loss.   Eyes:  Negative for visual disturbance.  Respiratory:  Negative for cough, chest tightness, shortness of breath and wheezing.   Cardiovascular:  Negative for chest pain, palpitations and leg swelling.  Gastrointestinal:  Negative for abdominal pain, constipation, diarrhea, nausea and vomiting.  Genitourinary:  Negative for dysuria, frequency and hematuria.  Musculoskeletal:  Negative for arthralgias and neck pain.  Skin:  Negative for rash.  Neurological:  Negative for dizziness, weakness, light-headedness, numbness and headaches.  Hematological:  Negative for adenopathy.  Psychiatric/Behavioral:  Negative  for behavioral problems, dysphoric mood and sleep disturbance.    Per HPI unless specifically indicated above     Objective:    BP 122/78 (BP Location: Left Arm, Patient Position: Sitting, Cuff Size: Normal)   Pulse 81   Ht 5' 9 (1.753 m)   Wt 211 lb 2 oz (95.8 kg)   SpO2 97%   BMI 31.18 kg/m   Wt Readings from Last 3 Encounters:  11/13/23 211 lb 2 oz (95.8  kg)  10/02/23 210 lb (95.3 kg)  05/21/23 224 lb 9.6 oz (101.9 kg)    Physical Exam Vitals and nursing note reviewed.  Constitutional:      General: He is not in acute distress.    Appearance: He is well-developed. He is not diaphoretic.     Comments: Well-appearing, comfortable, cooperative  HENT:     Head: Normocephalic and atraumatic.  Eyes:     General:        Right eye: No discharge.        Left eye: No discharge.     Conjunctiva/sclera: Conjunctivae normal.     Pupils: Pupils are equal, round, and reactive to light.  Neck:     Thyroid: No thyromegaly.     Vascular: No carotid bruit.  Cardiovascular:     Rate and Rhythm: Normal rate and regular rhythm.     Pulses: Normal pulses.     Heart sounds: Normal heart sounds. No murmur heard. Pulmonary:     Effort: Pulmonary effort is normal. No respiratory distress.     Breath sounds: Normal breath sounds. No wheezing or rales.  Abdominal:     General: Bowel sounds are normal. There is no distension.     Palpations: Abdomen is soft. There is no mass.     Tenderness: There is no abdominal tenderness.  Musculoskeletal:        General: No tenderness. Normal range of motion.     Cervical back: Normal range of motion and neck supple.     Right lower leg: No edema.     Left lower leg: No edema.     Comments: Upper / Lower Extremities: - Normal muscle tone, strength bilateral upper extremities 5/5, lower extremities 5/5  Lymphadenopathy:     Cervical: No cervical adenopathy.  Skin:    General: Skin is warm and dry.     Findings: No erythema or rash.  Neurological:      Mental Status: He is alert and oriented to person, place, and time.     Comments: Distal sensation intact to light touch all extremities  Psychiatric:        Mood and Affect: Mood normal.        Behavior: Behavior normal.        Thought Content: Thought content normal.     Comments: Well groomed, good eye contact, normal speech and thoughts     Results for orders placed or performed in visit on 09/10/23  Lipid panel   Collection Time: 09/11/23  8:36 AM  Result Value Ref Range   Cholesterol 171 <200 mg/dL   HDL 36 (L) > OR = 40 mg/dL   Triglycerides 881 <849 mg/dL   LDL Cholesterol (Calc) 112 (H) mg/dL (calc)   Total CHOL/HDL Ratio 4.8 <5.0 (calc)   Non-HDL Cholesterol (Calc) 135 (H) <130 mg/dL (calc)  Hemoglobin J8r   Collection Time: 09/11/23  8:36 AM  Result Value Ref Range   Hgb A1c MFr Bld 5.4 <5.7 %   Mean Plasma Glucose 108 mg/dL   eAG (mmol/L) 6.0 mmol/L  CBC with Differential/Platelet   Collection Time: 09/11/23  8:36 AM  Result Value Ref Range   WBC 9.2 3.8 - 10.8 Thousand/uL   RBC 5.65 4.20 - 5.80 Million/uL   Hemoglobin 15.7 13.2 - 17.1 g/dL   HCT 50.4 61.4 - 49.9 %   MCV 87.6 80.0 - 100.0 fL   MCH 27.8 27.0 - 33.0 pg   MCHC 31.7 (L) 32.0 - 36.0  g/dL   RDW 87.4 88.9 - 84.9 %   Platelets 293 140 - 400 Thousand/uL   MPV 10.3 7.5 - 12.5 fL   Neutro Abs 6,477 1,500 - 7,800 cells/uL   Absolute Lymphocytes 1,582 850 - 3,900 cells/uL   Absolute Monocytes 534 200 - 950 cells/uL   Eosinophils Absolute 543 (H) 15 - 500 cells/uL   Basophils Absolute 64 0 - 200 cells/uL   Neutrophils Relative % 70.4 %   Total Lymphocyte 17.2 %   Monocytes Relative 5.8 %   Eosinophils Relative 5.9 %   Basophils Relative 0.7 %  PSA   Collection Time: 09/11/23  8:36 AM  Result Value Ref Range   PSA 2.13 < OR = 4.00 ng/mL  TSH   Collection Time: 09/11/23  8:36 AM  Result Value Ref Range   TSH 0.94 0.40 - 4.50 mIU/L  Comprehensive metabolic panel with GFR   Collection Time:  09/11/23  8:36 AM  Result Value Ref Range   Glucose, Bld 92 65 - 99 mg/dL   BUN 15 7 - 25 mg/dL   Creat 9.14 9.29 - 8.69 mg/dL   eGFR 898 > OR = 60 fO/fpw/8.26f7   BUN/Creatinine Ratio SEE NOTE: 6 - 22 (calc)   Sodium 141 135 - 146 mmol/L   Potassium 4.4 3.5 - 5.3 mmol/L   Chloride 106 98 - 110 mmol/L   CO2 25 20 - 32 mmol/L   Calcium 9.4 8.6 - 10.3 mg/dL   Total Protein 6.2 6.1 - 8.1 g/dL   Albumin 4.1 3.6 - 5.1 g/dL   Globulin 2.1 1.9 - 3.7 g/dL (calc)   AG Ratio 2.0 1.0 - 2.5 (calc)   Total Bilirubin 0.4 0.2 - 1.2 mg/dL   Alkaline phosphatase (APISO) 65 35 - 144 U/L   AST 14 10 - 35 U/L   ALT 13 9 - 46 U/L      Assessment & Plan:   Problem List Items Addressed This Visit     Essential hypertension   Relevant Orders   CT CARDIAC SCORING (SELF PAY ONLY)   Hyperlipidemia   Relevant Orders   CT CARDIAC SCORING (SELF PAY ONLY)   Morbid obesity (HCC)   Other Visit Diagnoses       Annual physical exam    -  Primary     Elevated hemoglobin A1c            Updated Health Maintenance information Reviewed recent lab results with patient Encouraged improvement to lifestyle with diet and exercise Goal of weight loss  Obesity Obesity managed with GLP1 compounded semaglutide , resulting in significant weight loss. Constipation noted as a side effect. Discussed potential plateau in weight loss and future management options, including transitioning to Contrave. Limited availability of semaglutide  noted. - Continue semaglutide  as available, as compounded med. We can consider other locations / pharmacy in future if needed - Consider transitioning to Contrave for weight maintenance. - Monitor weight and adjust treatment as needed. - Manage constipation with stool softeners.  Hyperlipidemia managed with fenofibrate  Hyperlipidemia well controlled with fenofibrate . Discussed possible discontinuation due to weight loss and current lipid control. Note on med since 2018 or earlier -  Continue fenofibrate . - Consider trial discontinuation of fenofibrate  to assess lipid control without medication.  Hypertension, well controlled Hypertension well controlled with current management. - Valsartan  160mg  daily  Hearing loss with cerumen impaction Hearing aid in place. Hearing loss associated with cerumen impaction. - Continue regular ear flushing to manage cerumen impaction.  Adult Wellness Visit Routine adult wellness visit. Reviewed lab results, screenings, and vaccinations. Discussed weight management and medication options. - Continue annual wellness visits. - Discussed potential CT heart scan for proactive screening.          Orders Placed This Encounter  Procedures   CT CARDIAC SCORING (SELF PAY ONLY)    Standing Status:   Future    Expiration Date:   11/12/2024    Preferred imaging location?:   [facility]    No orders of the defined types were placed in this encounter.    Follow up plan: Return in about 1 year (around 11/12/2024) for 1 year fasting lab > 1 week later Annual Physical.  Future  labs 11/11/24   Marsa Officer, DO [facility] 11/13/2023, 1:24 PM

## 2023-11-20 ENCOUNTER — Ambulatory Visit: Payer: Self-pay | Admitting: Family Medicine

## 2023-11-20 ENCOUNTER — Other Ambulatory Visit: Payer: Self-pay | Admitting: Family Medicine

## 2023-11-20 ENCOUNTER — Ambulatory Visit
Admission: RE | Admit: 2023-11-20 | Discharge: 2023-11-20 | Disposition: A | Discharge status: Home / Self Care | Payer: Self-pay | Source: Ambulatory Visit | Attending: Family Medicine | Admitting: Family Medicine

## 2023-11-20 DIAGNOSIS — E7849 Other hyperlipidemia: Secondary | ICD-10-CM

## 2023-11-20 DIAGNOSIS — R931 Abnormal findings on diagnostic imaging of heart and coronary circulation: Secondary | ICD-10-CM

## 2023-11-20 DIAGNOSIS — I10 Essential (primary) hypertension: Secondary | ICD-10-CM | POA: Insufficient documentation

## 2023-11-20 MED ORDER — ATORVASTATIN CALCIUM 10 MG PO TABS: 10.0000 mg | ORAL_TABLET | Freq: Every day | ORAL | 3 refills | Status: DC

## 2023-11-21 ENCOUNTER — Ambulatory Visit

## 2023-11-21 VITALS — BP 118/82 | HR 90 | Ht 68.5 in | Wt 214.4 lb

## 2023-11-21 DIAGNOSIS — E669 Obesity, unspecified: Secondary | ICD-10-CM

## 2023-11-21 DIAGNOSIS — R931 Abnormal findings on diagnostic imaging of heart and coronary circulation: Secondary | ICD-10-CM | POA: Diagnosis not present

## 2023-11-21 DIAGNOSIS — Z789 Other specified health status: Secondary | ICD-10-CM

## 2023-11-21 DIAGNOSIS — E782 Mixed hyperlipidemia: Secondary | ICD-10-CM

## 2023-11-21 DIAGNOSIS — E7849 Other hyperlipidemia: Secondary | ICD-10-CM

## 2023-11-21 DIAGNOSIS — I10 Essential (primary) hypertension: Secondary | ICD-10-CM

## 2023-11-21 MED ORDER — ATORVASTATIN CALCIUM 10 MG PO TABS: 10.0000 mg | ORAL_TABLET | Freq: Every day | ORAL | Status: DC

## 2023-11-21 MED ORDER — EZETIMIBE 10 MG PO TABS: 10.0000 mg | ORAL_TABLET | Freq: Every day | ORAL | 3 refills | Status: DC

## 2023-11-21 NOTE — Addendum Note (Signed)
 Addended by: HARL HERON DEL on: 11/21/2023 04:24 PM   Modules accepted: Orders

## 2023-11-21 NOTE — Progress Notes (Signed)
  Cardiology Office Note   Date:  11/21/2023  ID:  Isaiah Johnson, DOB 1966/03/19, MRN [id number] PCP: Edman Marsa PARAS, DO  [facility] Providers Cardiologist:  Caron Poser, MD     History of Present Illness Isaiah Johnson is a 58 y.o. male PMH obesity, HLD, HTN, pre-DM who presents for further evaluation and management of elevated CAC score.  Patient reports he is overall doing well and feeling well.  He denies any chest pain, DOE, orthopnea, palpitations, syncope, or any other concerning cardiovascular symptoms.  He says that he was feeling well and losing weight and overall feeling well about his health, and then he got a coronary calcium  scan which understandably created some unease.  He has no known cardiovascular history.  His father had bypass surgery in his 89s.  His last LDL 112 09/2023.  Never smoker.  Relevant CVD History - CAC score of 1735 11/20/2023   ROS: Pt denies any chest discomfort, jaw pain, arm pain, palpitations, syncope, presyncope, orthopnea, PND, or LE edema.  Studies Reviewed I have independently reviewed the patient's ECG, blood work, and coronary calcium  scan.  Physical Exam VS:  BP 118/82 (BP Location: Right Arm, Patient Position: Sitting, Cuff Size: Normal)   Pulse 90   Ht 5' 8.5 (1.74 m)   Wt 214 lb 6.4 oz (97.3 kg)   SpO2 98%   BMI 32.13 kg/m        Wt Readings from Last 3 Encounters:  11/21/23 214 lb 6.4 oz (97.3 kg)  11/13/23 211 lb 2 oz (95.8 kg)  10/02/23 210 lb (95.3 kg)    GEN: No acute distress. NECK: No JVD; No carotid bruits. CARDIAC: RRR, no murmurs, rubs, gallops. RESPIRATORY:  Clear to auscultation. EXTREMITIES:  Warm and well-perfused. No edema.  ASSESSMENT AND PLAN Severely elevated coronary calcium  score HLD Patient presents for further evaluation of severely elevated CAC score.  He is currently asymptomatic.  Last LDL was 112 09/2023.  In the absence of symptoms, would proceed as follows:  Plan: -  Continue ASA 81 mg daily - Stop the fibrate and niacin, add zetia  10mg  every day - Would increase Lipitor to maximally-tolerated dose given prior cramping; crestor may offer less of this if we have issues with lipitor. LDL goal less than 70.  If we are unable to get his LDL to goal with statin therapy, then we should try a PCSK9 inhibitor. - Maintain tight control of other modifiable risk factors - No further testing is indicated currently; however, if he should develop any symptoms concerning for obstructive CAD, then we should obtain a coronary CT scan.  His calcium  burden may make interpretation challenging.  If this were to be nondiagnostic, then would get either a stress PET or just send straight for coronary angiography depending on symptoms and pre-test probability  HTN Well-controlled.  Continue valsartan  160 mg daily  Obesity Complicating all aspects of care and contributing to overall metabolic syndrome, though he has lost a significant amount of weight already, which is excellent. Continue current weight loss efforts with GLP-1 to optimize cardiometabolic ASCVD risk.        Dispo: RTC 1 year or earlier prn  Signed, Caron Poser, MD

## 2023-11-21 NOTE — Patient Instructions (Signed)
 Medication Instructions:  Your physician recommends the following medication changes.  STOP TAKING: Finofibrate  START TAKING: Lipitor 10 mg at bedtime and titrate up to 40 mg at bedtime (follow up with cardiology or your PCP for refills) Zetia  10 mg by mouth daily  *If you need a refill on your cardiac medications before your next appointment, please call your pharmacy*  Lab Work:  No labs ordered today  If you have labs (blood work) drawn today and your tests are completely normal, you will receive your results only by: MyChart Message (if you have MyChart) OR A paper copy in the mail If you have any lab test that is abnormal or we need to change your treatment, we will call you to review the results.  Testing/Procedures:  No test ordered today   Follow-Up: At [facility], you and your health needs are our priority.  As part of our continuing mission to provide you with exceptional heart care, our providers are all part of one team.  This team includes your primary Cardiologist (physician) and Advanced Practice Providers or APPs (Physician Assistants and Nurse Practitioners) who all work together to provide you with the care you need, when you need it.  Your next appointment:   12 month(s)  or as needed Provider:   You may see Caron Poser, MD  We recommend signing up for the patient portal called MyChart.  Sign up information is provided on this After Visit Summary.  MyChart is used to connect with patients for Virtual Visits (Telemedicine).  Patients are able to view lab/test results, encounter notes, upcoming appointments, etc.  Non-urgent messages can be sent to your provider as well.   To learn more about what you can do with MyChart, go to [email or web address].

## 2024-01-01 ENCOUNTER — Ambulatory Visit: Attending: Anesthesiology | Admitting: Anesthesiology

## 2024-01-01 ENCOUNTER — Encounter: Payer: Self-pay | Admitting: Anesthesiology

## 2024-01-01 DIAGNOSIS — M79601 Pain in right arm: Secondary | ICD-10-CM

## 2024-01-01 DIAGNOSIS — M5412 Radiculopathy, cervical region: Secondary | ICD-10-CM | POA: Diagnosis not present

## 2024-01-01 DIAGNOSIS — M5030 Other cervical disc degeneration, unspecified cervical region: Secondary | ICD-10-CM | POA: Diagnosis not present

## 2024-01-01 DIAGNOSIS — M542 Cervicalgia: Secondary | ICD-10-CM

## 2024-01-01 DIAGNOSIS — G8929 Other chronic pain: Secondary | ICD-10-CM

## 2024-01-01 DIAGNOSIS — G894 Chronic pain syndrome: Secondary | ICD-10-CM

## 2024-01-01 DIAGNOSIS — M47812 Spondylosis without myelopathy or radiculopathy, cervical region: Secondary | ICD-10-CM | POA: Diagnosis not present

## 2024-01-01 MED ORDER — TRAMADOL HCL 50 MG PO TABS: 100.0000 mg | ORAL_TABLET | Freq: Three times a day (TID) | ORAL | 2 refills | Status: AC

## 2024-01-01 MED ORDER — GABAPENTIN 300 MG PO CAPS: 300.0000 mg | ORAL_CAPSULE | ORAL | 2 refills | Status: AC

## 2024-01-01 MED ORDER — CYCLOBENZAPRINE HCL 10 MG PO TABS: 10.0000 mg | ORAL_TABLET | Freq: Two times a day (BID) | ORAL | 5 refills | Status: AC

## 2024-01-01 MED ORDER — MELOXICAM 7.5 MG PO TABS: 7.5000 mg | ORAL_TABLET | Freq: Two times a day (BID) | ORAL | 5 refills | Status: AC

## 2024-01-01 NOTE — Progress Notes (Signed)
 Virtual Visit via Telephone Note  I connected with [name] on 01/01/24 at  4:00 PM EDT by telephone and verified that I am speaking with the correct person using two identifiers.  Location: Patient: Home Provider: Pain clinic   I discussed the limitations, risks, security and privacy concerns of performing an evaluation and management service by telephone and the availability of in person appointments. I also discussed with the patient that there may be a patient responsible charge related to this service. The patient expressed understanding and agreed to proceed.   History of Present Illness: I spoke to Isaiah Johnson today.  This was done via telephone as we could not link for the video portion of the conference.  He reports that he has been doing reasonably well with his neck pain.  This is generally managed with tramadol  50 mg tablets 2 3 times a day to keep his baseline neck pain under control.  He is complaining of some left posterior midline neck pain with chronic numbness and tingling affecting the right hand.  With increased activity he occasionally experiences some right hand weakness but this is generally well-controlled with medication management and avoidance of certain activity.  He is also taking gabapentin  300 mg tablets and averaging 2 tablets 3 times a day to help with some of the neuralgic characteristic of the pain.  He is taking Flexeril  at bedtime to help with muscle spasms and try to do his daily stretching strengthening exercises.  No other changes in the quality characteristic or distribution of the pain are noted at this time.  Review of systems: General: No fevers or chills Pulmonary: No shortness of breath or dyspnea Cardiac: No angina or palpitations or lightheadedness GI: No abdominal pain or constipation Psych: No depression    Observations/Objective:  Current Outpatient Medications:    traMADol  (ULTRAM ) 50 MG tablet, Take 2 tablets (100 mg total) by mouth  3 (three) times daily., Disp: 180 tablet, Rfl: 2   acetaminophen (TYLENOL) 500 MG tablet, Take 500 mg by mouth every 6 (six) hours as needed., Disp: , Rfl:    aspirin EC 81 MG tablet, Take 81 mg by mouth daily., Disp: , Rfl:    atorvastatin  (LIPITOR) 10 MG tablet, Take 1 tablet (10 mg total) by mouth at bedtime. Titrate up to 40 mg by mouth at bedtime, Disp: , Rfl:    b complex vitamins capsule, Take 1 capsule by mouth daily., Disp: , Rfl:    [START ON 02/08/2024] cyclobenzaprine  (FLEXERIL ) 10 MG tablet, Take 1 tablet (10 mg total) by mouth 2 (two) times daily., Disp: 60 tablet, Rfl: 5   ezetimibe  (ZETIA ) 10 MG tablet, Take 1 tablet (10 mg total) by mouth daily., Disp: 90 tablet, Rfl: 3   gabapentin  (NEURONTIN ) 300 MG capsule, Take 1 capsule (300 mg total) by mouth every 4 (four) hours., Disp: 180 capsule, Rfl: 2   Melatonin 200 MCG TABS, Take by mouth., Disp: , Rfl:    [START ON 02/08/2024] meloxicam  (MOBIC ) 7.5 MG tablet, Take 1 tablet (7.5 mg total) by mouth 2 (two) times daily., Disp: 60 tablet, Rfl: 5   Multiple Vitamin (MULTIVITAMIN WITH MINERALS) TABS tablet, Take 1 tablet by mouth daily., Disp: , Rfl:    niacin 500 MG tablet, Take 500 mg by mouth at bedtime., Disp: , Rfl:    Omega-3 Fatty Acids (FISH OIL) 1000 MG CAPS, Take 1,000 mg by mouth daily., Disp: , Rfl:    Probiotic Product (PROBIOTIC DAILY PO), Take by mouth  daily., Disp: , Rfl:    Semaglutide ,0.25 or 0.5MG /DOS, 2 MG/1.5ML SOPN, Inject 1 mg into the skin once a week. Fax order to [facility] compounded, Disp: , Rfl:    valsartan  (DIOVAN ) 160 MG tablet, Take 1 tablet (160 mg total) by mouth daily., Disp: 90 tablet, Rfl: 3   Past Medical History:  Diagnosis Date   Chicken pox    Colon polyps    H/O rubella    History of measles    Hyperlipidemia    Hypertension    Psoriasis    Assessment and Plan:  1. Cervicalgia   2. Chronic neck pain   3. Cervical radicular pain   4. DDD (degenerative disc disease), cervical   5.  Cervical facet syndrome   6. Right arm pain   7. Cervical radiculitis   8. Chronic pain syndrome    Posterior conversation today I think is appropriate to refill his tramadol  gabapentin  and Flexeril .  This combination is working well for him and keeping the pain under reasonable control with no change in strength reported otherwise.  I do not feel he is a candidate for any interventional therapy at this time.  Have encouraged him to continue with TENS unit application to the posterior neck on the daily basis and to continue follow-up with physical therapy with mild traction if this is helping him.  Continue follow-up with his primary care physicians for baseline medical care with return to clinic scheduled in 2 months. Follow Up Instructions:    I discussed the assessment and treatment plan with the patient. The patient was provided an opportunity to ask questions and all were answered. The patient agreed with the plan and demonstrated an understanding of the instructions.   The patient was advised to call back or seek an in-person evaluation if the symptoms worsen or if the condition fails to improve as anticipated.  I provided 30 minutes of non-face-to-face time during this encounter.   Lynwood KANDICE Clause, MD

## 2024-02-03 ENCOUNTER — Encounter: Payer: Self-pay | Admitting: Family Medicine

## 2024-02-03 DIAGNOSIS — R931 Abnormal findings on diagnostic imaging of heart and coronary circulation: Secondary | ICD-10-CM

## 2024-02-03 DIAGNOSIS — E7849 Other hyperlipidemia: Secondary | ICD-10-CM

## 2024-02-04 MED ORDER — ATORVASTATIN CALCIUM 40 MG PO TABS: 40.0000 mg | ORAL_TABLET | Freq: Every day | ORAL | 3 refills | Status: AC

## 2024-02-13 NOTE — Telephone Encounter (Signed)
 Spoke with [facility], gave updated prescription information

## 2024-02-13 NOTE — Telephone Encounter (Unsigned)
 Copied from CRM [id number]. Topic: Clinical - Medical Advice >> Feb 13, 2024 11:59 AM Tiffini S wrote: Reason for CRM: Becky with [facility] [phone] with reference  [id number] called to confirm if prescription is suppose to be filled for atorvastatin  (LIPITOR) 10 MG tablet or atorvastatin  (LIPITOR) 40 MG tablet  Please call and advise/ Thank you.

## 2024-03-28 ENCOUNTER — Encounter: Payer: Self-pay | Admitting: Family Medicine

## 2024-04-15 ENCOUNTER — Encounter: Payer: Self-pay | Admitting: Family Medicine

## 2024-04-15 ENCOUNTER — Ambulatory Visit: Admitting: Family Medicine

## 2024-04-15 VITALS — BP 130/80 | HR 85 | Ht 68.5 in | Wt 222.0 lb

## 2024-04-15 DIAGNOSIS — I10 Essential (primary) hypertension: Secondary | ICD-10-CM | POA: Diagnosis not present

## 2024-04-15 DIAGNOSIS — E7849 Other hyperlipidemia: Secondary | ICD-10-CM | POA: Diagnosis not present

## 2024-04-15 DIAGNOSIS — R7309 Other abnormal glucose: Secondary | ICD-10-CM | POA: Diagnosis not present

## 2024-04-15 DIAGNOSIS — E66811 Obesity, class 1: Secondary | ICD-10-CM

## 2024-04-15 DIAGNOSIS — R931 Abnormal findings on diagnostic imaging of heart and coronary circulation: Secondary | ICD-10-CM

## 2024-04-15 DIAGNOSIS — Z23 Encounter for immunization: Secondary | ICD-10-CM | POA: Diagnosis not present

## 2024-04-15 NOTE — Patient Instructions (Addendum)
 Thank you for coming to the office today.  Oral Wegovy , starting dose 1.5mg  for 30 days, empty stomach 30 minutes.  We can try to get approval and cost savings, otherwise it would fall under the self pay option if not covered.  Please schedule a Follow-up Appointment to: Return in about 3 months (around 07/14/2024) for 3-4 months Weight check.  If you have any other questions or concerns, please feel free to call the office or send a message through MyChart. You may also schedule an earlier appointment if necessary.  Additionally, you may be receiving a survey about your experience at our office within a few days to 1 week by e-mail or mail. We value your feedback.  Marsa Officer, DO [facility], [place]

## 2024-04-15 NOTE — Progress Notes (Signed)
 "  Subjective:    Patient ID: Isaiah Johnson, male    DOB: [birth year], 59 y.o.   MRN: [id number]  Isaiah Johnson is a 59 y.o. male presenting on 04/15/2024 for Medical Management of Chronic Issues   HPI  Discussed the use of AI scribe software for clinical note transcription with the patient, who gave verbal consent to proceed.  History of Present Illness   Isaiah Johnson is a 59 year old male who presents for weight management consultation.  Obesity BMI >33 Weight gain and fluctuation Previously managed obesity in August 2025 with weight trend in 211-214 lbs, he was on compounded semaglutide  medication, but limited supply and high cost. - Weight has fluctuated over the past year. - Weight in August of previous year: 211-214 pounds. - Weight in November: 218 pounds. - Current weight: 222 pounds.  Pharmacologic weight management - Currently using compounded semaglutide  injections for weight management. - Finds current compounded semaglutide  less effective than previous versions. - Purchased two vials at $350 each; strength perceived as weaker than before. - Last dose administered approximately three weeks ago.  Medication considerations and preferences - Exploring alternative weight management options due to high cost and reduced effectiveness of compounded semaglutide . - Interested in oral weight management medications, including newly available options.  CHRONIC HTN: Reports controlled Med: Valsartan  160mg  daily Denies CP, dyspnea, HA, edema, dizziness / lightheadedness   Hyperlipidemia Elevated Coronary Calcium  score Off Fenofibrate , Pravastatin , Zetia  Now on Atorvastatin  40mg   Health Maintenance: Flu Shot Pneumonia vaccine     04/15/2024    8:46 AM 10/02/2023    8:59 AM 11/28/2022    1:50 PM  Depression screen PHQ 2/9  Decreased Interest 0 0 0  Down, Depressed, Hopeless 0 0 0  PHQ - 2 Score 0 0 0  Altered sleeping 1  0  Tired, decreased energy 0  0  Change  in appetite 0  0  Feeling bad or failure about yourself  0  0  Trouble concentrating 0  0  Moving slowly or fidgety/restless 0  0  Suicidal thoughts 0  0  PHQ-9 Score 1  0   Difficult doing work/chores Not difficult at all  Not difficult at all     Data saved with a previous flowsheet row definition       04/15/2024    8:47 AM 11/28/2022    1:50 PM 11/09/2020    8:24 AM  GAD 7 : Generalized Anxiety Score  Nervous, Anxious, on Edge 0 0 0  Control/stop worrying 0 0 0  Worry too much - different things 0 0 0  Trouble relaxing 0 0 0  Restless 0 0 0  Easily annoyed or irritable 0 0 0  Afraid - awful might happen 0 0 0  Total GAD 7 Score 0 0 0  Anxiety Difficulty Not difficult at all Not difficult at all Not difficult at all    Social History[1]  Review of Systems Per HPI unless specifically indicated above     Objective:    BP 130/80 (BP Location: Right Arm, Patient Position: Sitting, Cuff Size: Normal)   Pulse 85   Ht 5' 8.5 (1.74 m)   Wt 222 lb (100.7 kg)   SpO2 95%   BMI 33.26 kg/m   Wt Readings from Last 3 Encounters:  04/15/24 222 lb (100.7 kg)  11/21/23 214 lb 6.4 oz (97.3 kg)  11/13/23 211 lb 2 oz (95.8 kg)    Physical Exam Vitals and nursing note reviewed.  Constitutional:      General: He is not in acute distress.    Appearance: Normal appearance. He is well-developed. He is not diaphoretic.     Comments: Well-appearing, comfortable, cooperative  HENT:     Head: Normocephalic and atraumatic.  Eyes:     General:        Right eye: No discharge.        Left eye: No discharge.     Conjunctiva/sclera: Conjunctivae normal.  Cardiovascular:     Rate and Rhythm: Normal rate.  Pulmonary:     Effort: Pulmonary effort is normal.  Skin:    General: Skin is warm and dry.     Findings: No erythema or rash.  Neurological:     Mental Status: He is alert and oriented to person, place, and time.  Psychiatric:        Mood and Affect: Mood normal.        Behavior:  Behavior normal.        Thought Content: Thought content normal.     Comments: Well groomed, good eye contact, normal speech and thoughts    I have personally reviewed the radiology report from 11/20/23 on CT Coronary Calcium .  OVER-READ INTERPRETATION  CT CHEST   The following report is an over-read performed by radiologist Dr. Andrea Gasman of [facility] on 11/27/2023. This over-read does not include interpretation of cardiac or coronary anatomy or pathology. The coronary calcium  score interpretation by the cardiologist is attached.   COMPARISON:  None.   FINDINGS: Vascular: No aortic atherosclerosis. The included aorta is normal in caliber.   Mediastinum/nodes: No adenopathy or mass. Unremarkable esophagus.   Lungs: No focal airspace disease. No pulmonary nodule. No pleural fluid. The included airways are patent.   Upper abdomen: No acute or unexpected findings.   Musculoskeletal: There are no acute or suspicious osseous abnormalities.   IMPRESSION: No significant extracardiac findings.     Electronically Signed   By: Andrea Gasman M.D.   On: 11/27/2023 19:02    Addended by Gasman Andrea NOVAK, MD on 11/27/2023  7:04 PM    Study Result  Narrative & Impression  CLINICAL DATA:  Risk stratification   EXAM: Coronary Calcium  Score   TECHNIQUE: The patient was scanned on a Siemens Somatom scanner. Axial non-contrast 3 mm slices were carried out through the heart. The data set was analyzed on a dedicated work station and scored using the Agatson method.   FINDINGS: Non-cardiac: See separate report from [facility].   Ascending Aorta: Normal size   Pericardium: Normal   Coronary arteries: Normal origin of left and right coronary arteries. Distribution of arterial calcifications if present, as noted below;   LM 0   LAD 799   LCx 215   RCA 721   Total 1735   IMPRESSION AND RECOMMENDATION: 1. Coronary calcium  score of 1735.  This was 99th percentile for age and sex matched control.   2. CAC >300 in LAD, LCx, RCA. CAC-DRS A3/N3.   3. Recommend aspirin and statin if no contraindication.   4. Recommend cardiology consultation.   5. Continue heart healthy lifestyle and risk factor modification.   Electronically Signed: By: Redell Cave M.D. On: 11/20/2023 15:30     Results for orders placed or performed in visit on 09/10/23  Lipid panel   Collection Time: 09/11/23  8:36 AM  Result Value Ref Range   Cholesterol 171 <200 mg/dL   HDL 36 (L) > OR = 40 mg/dL  Triglycerides 118 <150 mg/dL   LDL Cholesterol (Calc) 112 (H) mg/dL (calc)   Total CHOL/HDL Ratio 4.8 <5.0 (calc)   Non-HDL Cholesterol (Calc) 135 (H) <130 mg/dL (calc)  Hemoglobin J8r   Collection Time: 09/11/23  8:36 AM  Result Value Ref Range   Hgb A1c MFr Bld 5.4 <5.7 %   Mean Plasma Glucose 108 mg/dL   eAG (mmol/L) 6.0 mmol/L  CBC with Differential/Platelet   Collection Time: 09/11/23  8:36 AM  Result Value Ref Range   WBC 9.2 3.8 - 10.8 Thousand/uL   RBC 5.65 4.20 - 5.80 Million/uL   Hemoglobin 15.7 13.2 - 17.1 g/dL   HCT 50.4 61.4 - 49.9 %   MCV 87.6 80.0 - 100.0 fL   MCH 27.8 27.0 - 33.0 pg   MCHC 31.7 (L) 32.0 - 36.0 g/dL   RDW 87.4 88.9 - 84.9 %   Platelets 293 140 - 400 Thousand/uL   MPV 10.3 7.5 - 12.5 fL   Neutro Abs 6,477 1,500 - 7,800 cells/uL   Absolute Lymphocytes 1,582 850 - 3,900 cells/uL   Absolute Monocytes 534 200 - 950 cells/uL   Eosinophils Absolute 543 (H) 15 - 500 cells/uL   Basophils Absolute 64 0 - 200 cells/uL   Neutrophils Relative % 70.4 %   Total Lymphocyte 17.2 %   Monocytes Relative 5.8 %   Eosinophils Relative 5.9 %   Basophils Relative 0.7 %  PSA   Collection Time: 09/11/23  8:36 AM  Result Value Ref Range   PSA 2.13 < OR = 4.00 ng/mL  TSH   Collection Time: 09/11/23  8:36 AM  Result Value Ref Range   TSH 0.94 0.40 - 4.50 mIU/L  Comprehensive metabolic panel with GFR   Collection  Time: 09/11/23  8:36 AM  Result Value Ref Range   Glucose, Bld 92 65 - 99 mg/dL   BUN 15 7 - 25 mg/dL   Creat 9.14 9.29 - 8.69 mg/dL   eGFR 898 > OR = 60 fO/fpw/8.26f7   BUN/Creatinine Ratio SEE NOTE: 6 - 22 (calc)   Sodium 141 135 - 146 mmol/L   Potassium 4.4 3.5 - 5.3 mmol/L   Chloride 106 98 - 110 mmol/L   CO2 25 20 - 32 mmol/L   Calcium  9.4 8.6 - 10.3 mg/dL   Total Protein 6.2 6.1 - 8.1 g/dL   Albumin 4.1 3.6 - 5.1 g/dL   Globulin 2.1 1.9 - 3.7 g/dL (calc)   AG Ratio 2.0 1.0 - 2.5 (calc)   Total Bilirubin 0.4 0.2 - 1.2 mg/dL   Alkaline phosphatase (APISO) 65 35 - 144 U/L   AST 14 10 - 35 U/L   ALT 13 9 - 46 U/L      Assessment & Plan:   Problem List Items Addressed This Visit     Essential hypertension   Hyperlipidemia   Other Visit Diagnoses       Obesity (BMI 30.0-34.9)    -  Primary     Flu vaccine need       Relevant Orders   Flu vaccine trivalent PF, 6mos and older(Flulaval,Afluria,Fluarix,Fluzone) (Completed)     Need for Streptococcus pneumoniae vaccination       Relevant Orders   Pneumococcal conjugate vaccine 20-valent (Completed)     Elevated hemoglobin A1c         Elevated coronary artery calcium  score           Obesity BMI >33 Weight stable at 218 lbs since November. Previous  medications not covered or unsuitable. Oral Wegovy  promising with insurance coverage check needed. - Initiate oral Wegovy  1.5 mg for 30 days, then escalate dose. - Check insurance coverage for oral Wegovy ; proceed with pharmacy order if covered. - Consider self-pay at $149/month if not covered. - Follow-up in mid to end of April to assess weight and medication efficacy.  General Health Maintenance Discussed pneumonia vaccine eligibility and benefits.  Administered flu shot today. - Administered pneumonia vaccine today.   Hypertension Controlled on current therapy Valsartan   Hyperlipidemia Prior meds Fenofibrate , Pravastatin , Zetia  Previously controlled on Atorvastatin       Lifestyle Documentation  We had a detailed discussion today reviewing course of lifestyle modifications to assist in weight loss and promote overall health. He remains motivated and committed to continuing these efforts while starting to take weight loss / appetite reducing GLP1 medication.  For diet he has been following caloric reduced diet, down to 1600-1800 calorie intake with emphasis on high protein foods and low carbohydrate intake based on structured meal plan.   For exercise, he has been continuing a structured regimen of cardiovascular exercise with walking He will continue to improve mileage and step counts and track exercise levels.  - Advise using apps like My Fitness Pal to track caloric intake. - Schedule follow-up in three months to assess weight and medication efficacy.     Orders Placed This Encounter  Procedures   Flu vaccine trivalent PF, 6mos and older(Flulaval,Afluria,Fluarix,Fluzone)   Pneumococcal conjugate vaccine 20-valent    No orders of the defined types were placed in this encounter.   Follow up plan: Return in about 3 months (around 07/14/2024) for 3-4 months Weight check.   Marsa Officer, DO [facility] 04/15/2024, 8:58 AM     [1]  Social History Tobacco Use   Smoking status: Never   Smokeless tobacco: Never  Substance Use Topics   Alcohol use: Yes    Comment: 1 drink   Drug use: No   "

## 2024-04-16 NOTE — Progress Notes (Incomplete)
 "  Subjective:    Patient ID: Isaiah Johnson, male    DOB: Aug 10, 1965, 59 y.o.   MRN: [id number]  Isaiah Johnson is a 59 y.o. male presenting on 04/15/2024 for Medical Management of Chronic Issues   HPI  Discussed the use of AI scribe software for clinical note transcription with the patient, who gave verbal consent to proceed.  History of Present Illness   [name] is a 59 year old male who presents for weight management consultation.  Weight gain and fluctuation - Weight has fluctuated over the past year. - Weight in August of previous year: 211-214 pounds. - Weight in November: 218 pounds. - Current weight: 222 pounds. - Weight has been maintained around 218 pounds since starting new medication in October.  Pharmacologic weight management - Currently using compounded semaglutide  injections for weight management. - Finds current compounded semaglutide  less effective than previous versions. - Purchased two vials at $350 each; strength perceived as weaker than before. - Last dose administered approximately three weeks ago.  Medication considerations and preferences - Exploring alternative weight management options due to high cost and reduced effectiveness of compounded semaglutide . - Interested in oral weight management medications, including newly available options. - Concerned about potential interaction between Contrave and tramadol , which he takes regularly.       Obesity BMI >33 Elevated A1c Metabolic health and weight management ***Previously managed obesity in August 2025 with weight trend in 211-214 lbs, he was on compounded semaglutide  medication, but limited supply and high cost.  - Hemoglobin A1c is 5.4, decreased from 5.8 a year ago - Weight loss of approximately 40 pounds over the past year, from 253 pounds to 211 pounds and down 13-15 lbs in past 6 months - Weight loss attributed to dietary changes and weekly semaglutide  injections - Currently on  compounded 2.4mg  semaglutide , with a recent dosage change and approximately four to five weeks of medication remaining - [name]   CHRONIC HTN: Reports controlled Med: Valsartan  160mg  daily Lifestyle: Drinks coffee, soda Admits occasional sweats / heat intolerance Denies CP, dyspnea, HA, edema, dizziness / lightheadedness   Hyperlipidemia Lipid panel is well controlled, LDL is in 110s On Fenofibrate  67 daily  Off Pravastatin  40mg  since 2023 due to possible myalgia side effect  Health Maintenance: ***     04/15/2024    8:46 AM 10/02/2023    8:59 AM 11/28/2022    1:50 PM  Depression screen PHQ 2/9  Decreased Interest 0 0 0  Down, Depressed, Hopeless 0 0 0  PHQ - 2 Score 0 0 0  Altered sleeping 1  0  Tired, decreased energy 0  0  Change in appetite 0  0  Feeling bad or failure about yourself  0  0  Trouble concentrating 0  0  Moving slowly or fidgety/restless 0  0  Suicidal thoughts 0  0  PHQ-9 Score 1  0   Difficult doing work/chores Not difficult at all  Not difficult at all     Data saved with a previous flowsheet row definition       04/15/2024    8:47 AM 11/28/2022    1:50 PM 11/09/2020    8:24 AM  GAD 7 : Generalized Anxiety Score  Nervous, Anxious, on Edge 0 0 0  Control/stop worrying 0 0 0  Worry too much - different things 0 0 0  Trouble relaxing 0 0 0  Restless 0 0 0  Easily annoyed or irritable 0 0 0  Afraid -  awful might happen 0 0 0  Total GAD 7 Score 0 0 0  Anxiety Difficulty Not difficult at all Not difficult at all Not difficult at all    Social History[1]  Review of Systems Per HPI unless specifically indicated above     Objective:    BP 130/80 (BP Location: Right Arm, Patient Position: Sitting, Cuff Size: Normal)   Pulse 85   Ht 5' 8.5 (1.74 m)   Wt 222 lb (100.7 kg)   SpO2 95%   BMI 33.26 kg/m   Wt Readings from Last 3 Encounters:  04/15/24 222 lb (100.7 kg)  11/21/23 214 lb 6.4 oz (97.3 kg)  11/13/23 211 lb 2 oz (95.8 kg)     Physical Exam  Results for orders placed or performed in visit on 09/10/23  Lipid panel   Collection Time: 09/11/23  8:36 AM  Result Value Ref Range   Cholesterol 171 <200 mg/dL   HDL 36 (L) > OR = 40 mg/dL   Triglycerides 881 <849 mg/dL   LDL Cholesterol (Calc) 112 (H) mg/dL (calc)   Total CHOL/HDL Ratio 4.8 <5.0 (calc)   Non-HDL Cholesterol (Calc) 135 (H) <130 mg/dL (calc)  Hemoglobin J8r   Collection Time: 09/11/23  8:36 AM  Result Value Ref Range   Hgb A1c MFr Bld 5.4 <5.7 %   Mean Plasma Glucose 108 mg/dL   eAG (mmol/L) 6.0 mmol/L  CBC with Differential/Platelet   Collection Time: 09/11/23  8:36 AM  Result Value Ref Range   WBC 9.2 3.8 - 10.8 Thousand/uL   RBC 5.65 4.20 - 5.80 Million/uL   Hemoglobin 15.7 13.2 - 17.1 g/dL   HCT 50.4 61.4 - 49.9 %   MCV 87.6 80.0 - 100.0 fL   MCH 27.8 27.0 - 33.0 pg   MCHC 31.7 (L) 32.0 - 36.0 g/dL   RDW 87.4 88.9 - 84.9 %   Platelets 293 140 - 400 Thousand/uL   MPV 10.3 7.5 - 12.5 fL   Neutro Abs 6,477 1,500 - 7,800 cells/uL   Absolute Lymphocytes 1,582 850 - 3,900 cells/uL   Absolute Monocytes 534 200 - 950 cells/uL   Eosinophils Absolute 543 (H) 15 - 500 cells/uL   Basophils Absolute 64 0 - 200 cells/uL   Neutrophils Relative % 70.4 %   Total Lymphocyte 17.2 %   Monocytes Relative 5.8 %   Eosinophils Relative 5.9 %   Basophils Relative 0.7 %  PSA   Collection Time: 09/11/23  8:36 AM  Result Value Ref Range   PSA 2.13 < OR = 4.00 ng/mL  TSH   Collection Time: 09/11/23  8:36 AM  Result Value Ref Range   TSH 0.94 0.40 - 4.50 mIU/L  Comprehensive metabolic panel with GFR   Collection Time: 09/11/23  8:36 AM  Result Value Ref Range   Glucose, Bld 92 65 - 99 mg/dL   BUN 15 7 - 25 mg/dL   Creat 9.14 9.29 - 8.69 mg/dL   eGFR 898 > OR = 60 fO/fpw/8.26f7   BUN/Creatinine Ratio SEE NOTE: 6 - 22 (calc)   Sodium 141 135 - 146 mmol/L   Potassium 4.4 3.5 - 5.3 mmol/L   Chloride 106 98 - 110 mmol/L   CO2 25 20 - 32 mmol/L    Calcium  9.4 8.6 - 10.3 mg/dL   Total Protein 6.2 6.1 - 8.1 g/dL   Albumin 4.1 3.6 - 5.1 g/dL   Globulin 2.1 1.9 - 3.7 g/dL (calc)   AG Ratio 2.0 1.0 -  2.5 (calc)   Total Bilirubin 0.4 0.2 - 1.2 mg/dL   Alkaline phosphatase (APISO) 65 35 - 144 U/L   AST 14 10 - 35 U/L   ALT 13 9 - 46 U/L      Assessment & Plan:   Problem List Items Addressed This Visit   None Visit Diagnoses       Obesity (BMI 30.0-34.9)    -  Primary     Flu vaccine need       Relevant Orders   Flu vaccine trivalent PF, 6mos and older(Flulaval,Afluria,Fluarix,Fluzone) (Completed)        ***  Orders Placed This Encounter  Procedures   Flu vaccine trivalent PF, 6mos and older(Flulaval,Afluria,Fluarix,Fluzone)    No orders of the defined types were placed in this encounter.   Follow up plan: Return in about 3 months (around 07/14/2024) for 3-4 months Weight check.  Future labs ordered for ***   Marsa Officer, DO [facility] 04/15/2024, 8:58 AM       [1] Social History Tobacco Use   Smoking status: Never   Smokeless tobacco: Never  Substance Use Topics   Alcohol use: Yes    Comment: 1 drink   Drug use: No  "

## 2024-07-29 ENCOUNTER — Ambulatory Visit: Admitting: Family Medicine

## 2024-11-11 ENCOUNTER — Other Ambulatory Visit

## 2024-11-18 ENCOUNTER — Encounter: Admitting: Family Medicine
